# Patient Record
Sex: Male | Born: 1970
Health system: Southern US, Community
[De-identification: ages and names within clinical notes are randomized; demographics above are authoritative.]

## PROBLEM LIST (undated history)

## (undated) ENCOUNTER — Emergency Department (HOSPITAL_COMMUNITY): Admission: EM | Payer: Self-pay | Source: Home / Self Care

## (undated) DIAGNOSIS — K219 Gastro-esophageal reflux disease without esophagitis: Secondary | ICD-10-CM

## (undated) DIAGNOSIS — T8859XA Other complications of anesthesia, initial encounter: Secondary | ICD-10-CM

## (undated) DIAGNOSIS — T7840XA Allergy, unspecified, initial encounter: Secondary | ICD-10-CM

## (undated) DIAGNOSIS — E785 Hyperlipidemia, unspecified: Secondary | ICD-10-CM

## (undated) DIAGNOSIS — Z8619 Personal history of other infectious and parasitic diseases: Secondary | ICD-10-CM

## (undated) DIAGNOSIS — M1990 Unspecified osteoarthritis, unspecified site: Secondary | ICD-10-CM

## (undated) DIAGNOSIS — E119 Type 2 diabetes mellitus without complications: Secondary | ICD-10-CM

## (undated) DIAGNOSIS — T4145XA Adverse effect of unspecified anesthetic, initial encounter: Secondary | ICD-10-CM

## (undated) HISTORY — DX: Allergy, unspecified, initial encounter: T78.40XA

## (undated) HISTORY — PX: ORTHOPEDIC SURGERY: SHX850

## (undated) HISTORY — PX: OTHER SURGICAL HISTORY: SHX169

## (undated) HISTORY — DX: Type 2 diabetes mellitus without complications: E11.9

## (undated) HISTORY — PX: BACK SURGERY: SHX140

## (undated) HISTORY — DX: Hyperlipidemia, unspecified: E78.5

## (undated) HISTORY — PX: ROTATOR CUFF REPAIR: SHX139

---

## 2000-08-04 ENCOUNTER — Encounter: Admission: RE | Admit: 2000-08-04 | Discharge: 2000-11-02 | Payer: Self-pay | Admitting: Anesthesiology

## 2000-08-31 ENCOUNTER — Ambulatory Visit (HOSPITAL_COMMUNITY): Admission: RE | Admit: 2000-08-31 | Discharge: 2000-09-01 | Payer: Self-pay | Admitting: Neurosurgery

## 2000-08-31 ENCOUNTER — Encounter: Payer: Self-pay | Admitting: Neurosurgery

## 2005-02-28 ENCOUNTER — Ambulatory Visit: Payer: Self-pay | Admitting: Family Medicine

## 2005-07-10 ENCOUNTER — Ambulatory Visit: Payer: Self-pay | Admitting: Family Medicine

## 2006-04-22 ENCOUNTER — Encounter: Admission: RE | Admit: 2006-04-22 | Discharge: 2006-07-21 | Payer: Self-pay | Admitting: Orthopedic Surgery

## 2008-04-22 ENCOUNTER — Emergency Department (HOSPITAL_COMMUNITY): Admission: EM | Admit: 2008-04-22 | Discharge: 2008-04-22 | Payer: Self-pay | Admitting: Emergency Medicine

## 2009-05-13 ENCOUNTER — Emergency Department (HOSPITAL_COMMUNITY): Admission: EM | Admit: 2009-05-13 | Discharge: 2009-05-13 | Payer: Self-pay | Admitting: Emergency Medicine

## 2010-11-09 ENCOUNTER — Inpatient Hospital Stay (INDEPENDENT_AMBULATORY_CARE_PROVIDER_SITE_OTHER)
Admission: RE | Admit: 2010-11-09 | Discharge: 2010-11-09 | Disposition: A | Discharge status: Home / Self Care | Payer: Self-pay | Source: Ambulatory Visit | Attending: Family Medicine | Admitting: Family Medicine

## 2010-11-09 ENCOUNTER — Ambulatory Visit (INDEPENDENT_AMBULATORY_CARE_PROVIDER_SITE_OTHER): Payer: Self-pay

## 2010-11-09 DIAGNOSIS — IMO0002 Reserved for concepts with insufficient information to code with codable children: Secondary | ICD-10-CM

## 2010-11-11 ENCOUNTER — Other Ambulatory Visit: Payer: Self-pay | Admitting: Orthopedic Surgery

## 2010-11-11 DIAGNOSIS — M25562 Pain in left knee: Secondary | ICD-10-CM

## 2010-11-12 ENCOUNTER — Ambulatory Visit
Admission: RE | Admit: 2010-11-12 | Discharge: 2010-11-12 | Disposition: A | Discharge status: Home / Self Care | Payer: Self-pay | Source: Ambulatory Visit | Attending: Orthopedic Surgery | Admitting: Orthopedic Surgery

## 2010-11-12 DIAGNOSIS — M25562 Pain in left knee: Secondary | ICD-10-CM

## 2010-11-28 ENCOUNTER — Ambulatory Visit: Payer: Self-pay | Attending: Orthopedic Surgery | Admitting: Physical Therapy

## 2010-11-28 DIAGNOSIS — M25569 Pain in unspecified knee: Secondary | ICD-10-CM | POA: Insufficient documentation

## 2010-11-28 DIAGNOSIS — R269 Unspecified abnormalities of gait and mobility: Secondary | ICD-10-CM | POA: Insufficient documentation

## 2010-11-28 DIAGNOSIS — M25669 Stiffness of unspecified knee, not elsewhere classified: Secondary | ICD-10-CM | POA: Insufficient documentation

## 2010-11-28 DIAGNOSIS — R5381 Other malaise: Secondary | ICD-10-CM | POA: Insufficient documentation

## 2010-11-28 DIAGNOSIS — IMO0001 Reserved for inherently not codable concepts without codable children: Secondary | ICD-10-CM | POA: Insufficient documentation

## 2010-11-29 LAB — URINALYSIS, ROUTINE W REFLEX MICROSCOPIC
Bilirubin Urine: NEGATIVE
Glucose, UA: NEGATIVE mg/dL
Hgb urine dipstick: NEGATIVE
Ketones, ur: NEGATIVE mg/dL
Nitrite: NEGATIVE
Protein, ur: NEGATIVE mg/dL
Specific Gravity, Urine: <1.005 — ABNORMAL LOW (ref 1.005–1.030)
Urobilinogen, UA: 0.2 mg/dL (ref 0.0–1.0)
pH: 6 (ref 5.0–8.0)

## 2010-11-29 LAB — COMPREHENSIVE METABOLIC PANEL
ALT: 23 U/L (ref 0–53)
AST: 23 U/L (ref 0–37)
Albumin: 3.9 g/dL (ref 3.5–5.2)
Alkaline Phosphatase: 72 U/L (ref 39–117)
CO2: 26 mEq/L (ref 19–32)
Chloride: 105 mEq/L (ref 96–112)
Creatinine, Ser: 1 mg/dL (ref 0.4–1.5)
GFR calc Af Amer: >60 mL/min (ref >60)
GFR calc non Af Amer: >60 mL/min (ref >60)
Potassium: 4 mEq/L (ref 3.5–5.1)
Total Bilirubin: 0.4 mg/dL (ref 0.3–1.2)

## 2010-12-05 ENCOUNTER — Ambulatory Visit: Payer: Self-pay | Admitting: Physical Therapy

## 2010-12-09 ENCOUNTER — Encounter: Payer: Self-pay | Admitting: Physical Therapy

## 2010-12-12 ENCOUNTER — Ambulatory Visit: Payer: Self-pay | Admitting: Physical Therapy

## 2010-12-19 ENCOUNTER — Ambulatory Visit: Payer: Self-pay | Admitting: Physical Therapy

## 2010-12-26 ENCOUNTER — Ambulatory Visit: Payer: Self-pay | Attending: Orthopedic Surgery | Admitting: Physical Therapy

## 2010-12-26 DIAGNOSIS — IMO0001 Reserved for inherently not codable concepts without codable children: Secondary | ICD-10-CM | POA: Insufficient documentation

## 2010-12-26 DIAGNOSIS — R269 Unspecified abnormalities of gait and mobility: Secondary | ICD-10-CM | POA: Insufficient documentation

## 2010-12-26 DIAGNOSIS — R5381 Other malaise: Secondary | ICD-10-CM | POA: Insufficient documentation

## 2010-12-26 DIAGNOSIS — M25669 Stiffness of unspecified knee, not elsewhere classified: Secondary | ICD-10-CM | POA: Insufficient documentation

## 2010-12-26 DIAGNOSIS — M25569 Pain in unspecified knee: Secondary | ICD-10-CM | POA: Insufficient documentation

## 2011-01-10 NOTE — Procedures (Signed)
[facility]  Patient:    WHITLEY, STRYCHARZ                       MRN: [id number] Proc. Date: 08/13/00 Adm. Date:  [id number] Attending:  Thyra Breed CC:         Reinaldo Meeker, M.D.   Procedure Report  PROCEDURE:  Lumbar epidural steroid injection.  DIAGNOSIS:  Herniated nucleus pulposus at L4-5.  INTERVAL HISTORY:  The patients noted no improvement after his first injection but he wishes to proceed with a second today.  PHYSICAL EXAMINATION:  Blood pressure 118/82, heart rate 79, respiratory rate 20, O2 saturations 99%, pain level 7/10, temperature 98. He shows good healing from his previous injection site.  DESCRIPTION OF PROCEDURE:  After informed consent was obtained, the patient was placed in the sitting position and monitored. The patients back was prepped with Betadine x 3. A skin wheal was raised at the L4-5 interspace with 1 percent lidocaine. A 20 gauge Tuohy needle was introduced to the lumbar epidural space to loss of resistance to preservative free normal saline. There was no cerebrospinal fluid nor blood. 120 mg of Medrol and 8  ml of preservative free normal saline was gently injected.  The needle was flushed with preservative free normal saline and removed intact.  CONDITION POST PROCEDURE:  Stable.  DISCHARGE INSTRUCTIONS:  Resume previous diet. Limitations in activities per instruction sheet. Continue on current medications. The patient plans to follow-up with Dr. Gerlene Fee. DD:  08/13/00 TD:  08/14/00 Job: [id number]

## 2011-01-10 NOTE — Op Note (Signed)
[facility]  Patient:    BARNEY, RUSSOMANNO                       MRN: [id number] Proc. Date: 08/31/00 Adm. Date:  [id number] Attending:  Thyra Breed                           Operative Report  PREOPERATIVE DIAGNOSIS:  Herniated disk, L4-5, left.  POSTOPERATIVE DIAGNOSIS:  Herniated disk, L4-5, left.  PROCEDURE:  Left L4-5 intralaminar laminotomy for excision of herniated disk with the operating microscope.  SECONDARY PROCEDURE:  Microdissection, L4-5 disk and L5 nerve roots.  SURGEON:  Reinaldo Meeker, M.D.  ASSISTANT:  Julio Sicks, M.D.  PROCEDURE IN DETAIL:  After being placed in the prone position, the patients back was prepped and draped in the usual sterile fashion.  A localizing x-ray was taken prior to incision to identify the L4-5 level.  A midline incision was made above the spinous processes of L4 and L5.  Using the Bovie cutting current, the incision was carried down to the spinous processes. Subperiosteal dissection was then carried out on the left side of the spinous processes and laminae and the McCullough self-retaining retractor was placed for exposure.  A second x-ray was taken to confirm approach of the L4-5 level and this was correct.  Using the high-speed drill, the inferior one-third of the L4 lamina and the medial one-third of the facet joint were removed.  Drill was then used to remove the superior one-half of the L5 lamina.  Residual bone and ligamentum flavum were removed in a piecemeal fashion.  The microscope was draped and brought into the field and used the remainder of the case.  Using microdissection technique, the lateral aspect of the thecal sac and L5 nerve root were identified.  Further coagulation was carried out down to the floor of the canal to identify the L4-5 disk, which was found to be tremendously herniated.  After coagulating on the annulus, the annulus was incised with a 15 blade.  Using pituitary  rongeurs and curettes, a very thorough disk space clean-out was carried out, including removal of the known inferior fragment. At this point, inspection was carried out in all directions for any evidence of residual compression and none could be identified.  Large amounts of irrigation were carried out and any bleeding controlled with bipolar coagulation and Gelfoam.  The wound was then closed using interrupted Vicryl on the muscle, fascia and subcutaneous and subcuticular tissues and staples on the skin.  A sterile dressing was then applied.  Patient was extubated and taken to the recovery room in stable condition. DD:  08/31/00 TD:  08/31/00 Job: [id number]

## 2013-01-25 ENCOUNTER — Ambulatory Visit: Payer: Self-pay | Admitting: Physician Assistant

## 2013-02-23 ENCOUNTER — Observation Stay (HOSPITAL_COMMUNITY)
Admission: EM | Admit: 2013-02-23 | Discharge: 2013-02-24 | Disposition: A | Discharge status: Home / Self Care | Payer: BC Managed Care – PPO | Attending: General Surgery | Admitting: General Surgery

## 2013-02-23 ENCOUNTER — Encounter (HOSPITAL_COMMUNITY): Payer: Self-pay | Admitting: *Deleted

## 2013-02-23 ENCOUNTER — Emergency Department (HOSPITAL_COMMUNITY): Payer: BC Managed Care – PPO

## 2013-02-23 DIAGNOSIS — K8000 Calculus of gallbladder with acute cholecystitis without obstruction: Secondary | ICD-10-CM

## 2013-02-23 DIAGNOSIS — R1011 Right upper quadrant pain: Secondary | ICD-10-CM | POA: Insufficient documentation

## 2013-02-23 DIAGNOSIS — K8010 Calculus of gallbladder with chronic cholecystitis without obstruction: Principal | ICD-10-CM | POA: Insufficient documentation

## 2013-02-23 HISTORY — DX: Gastro-esophageal reflux disease without esophagitis: K21.9

## 2013-02-23 LAB — COMPREHENSIVE METABOLIC PANEL
ALT: 26 U/L (ref 0–53)
Albumin: 3.8 g/dL (ref 3.5–5.2)
Calcium: 9.3 mg/dL (ref 8.4–10.5)
GFR calc Af Amer: 90 mL/min — ABNORMAL LOW (ref >90)
Glucose, Bld: 109 mg/dL — ABNORMAL HIGH (ref 70–99)
Potassium: 4 mEq/L (ref 3.5–5.1)
Sodium: 140 mEq/L (ref 135–145)
Total Protein: 7.4 g/dL (ref 6.0–8.3)

## 2013-02-23 LAB — CBC WITH DIFFERENTIAL/PLATELET
Basophils Absolute: 0 10*3/uL (ref 0.0–0.1)
Basophils Relative: 1 % (ref 0–1)
Eosinophils Absolute: 0.3 10*3/uL (ref 0.0–0.7)
Eosinophils Relative: 5 % (ref 0–5)
Lymphs Abs: 2.4 10*3/uL (ref 0.7–4.0)
MCH: 28.2 pg (ref 26.0–34.0)
MCHC: 34.5 g/dL (ref 30.0–36.0)
MCV: 81.6 fL (ref 78.0–100.0)
Neutrophils Relative %: 56 % (ref 43–77)
Platelets: 241 10*3/uL (ref 150–400)
RDW: 12.4 % (ref 11.5–15.5)

## 2013-02-23 LAB — HEPATIC FUNCTION PANEL
ALT: 27 U/L (ref 0–53)
AST: 25 U/L (ref 0–37)
Albumin: 3.7 g/dL (ref 3.5–5.2)
Bilirubin, Direct: <0.1 mg/dL (ref 0.0–0.3)

## 2013-02-23 LAB — LIPASE, BLOOD: Lipase: 117 U/L — ABNORMAL HIGH (ref 11–59)

## 2013-02-23 LAB — SURGICAL PCR SCREEN: Staphylococcus aureus: NEGATIVE

## 2013-02-23 MED ORDER — ONDANSETRON HCL 4 MG/2ML IJ SOLN
4.0000 mg | Freq: Three times a day (TID) | INTRAMUSCULAR | Status: AC | PRN
  Administered 2013-02-23: 4 mg via INTRAVENOUS
  Filled 2013-02-23: qty 2

## 2013-02-23 MED ORDER — ENOXAPARIN SODIUM 40 MG/0.4ML ~~LOC~~ SOLN
40.0000 mg | SUBCUTANEOUS | Status: DC
  Administered 2013-02-23 – 2013-02-24 (×2): 40 mg via SUBCUTANEOUS
  Filled 2013-02-23 (×2): qty 0.4

## 2013-02-23 MED ORDER — ONDANSETRON HCL 4 MG/2ML IJ SOLN: 4.0000 mg | Freq: Four times a day (QID) | INTRAMUSCULAR | Status: DC | PRN

## 2013-02-23 MED ORDER — SODIUM CHLORIDE 0.9 % IV SOLN
INTRAVENOUS | Status: DC
  Administered 2013-02-23: 08:00:00 via INTRAVENOUS

## 2013-02-23 MED ORDER — HYDROMORPHONE HCL PF 1 MG/ML IJ SOLN
1.0000 mg | INTRAMUSCULAR | Status: DC | PRN
Start: 2013-02-23 — End: 2013-02-24
  Administered 2013-02-23: 1 mg via INTRAVENOUS

## 2013-02-23 MED ORDER — HYDROMORPHONE HCL PF 1 MG/ML IJ SOLN
1.0000 mg | Freq: Once | INTRAMUSCULAR | Status: AC
  Administered 2013-02-23: 1 mg via INTRAVENOUS
  Filled 2013-02-23: qty 1

## 2013-02-23 MED ORDER — SODIUM CHLORIDE 0.9 % IV SOLN
INTRAVENOUS | Status: DC
  Administered 2013-02-23 (×2): via INTRAVENOUS

## 2013-02-23 MED ORDER — SODIUM CHLORIDE 0.9 % IV SOLN
3.0000 g | Freq: Once | INTRAVENOUS | Status: AC
  Administered 2013-02-23: 3 g via INTRAVENOUS
  Filled 2013-02-23: qty 3

## 2013-02-23 MED ORDER — PANTOPRAZOLE SODIUM 40 MG IV SOLR
40.0000 mg | Freq: Every day | INTRAVENOUS | Status: DC
  Administered 2013-02-23: 40 mg via INTRAVENOUS
  Filled 2013-02-23: qty 40

## 2013-02-23 MED ORDER — ONDANSETRON HCL 4 MG/2ML IJ SOLN
4.0000 mg | Freq: Once | INTRAMUSCULAR | Status: AC
  Administered 2013-02-23: 4 mg via INTRAVENOUS
  Filled 2013-02-23: qty 2

## 2013-02-23 MED ORDER — ACETAMINOPHEN 325 MG PO TABS: 650.0000 mg | ORAL_TABLET | Freq: Four times a day (QID) | ORAL | Status: DC | PRN

## 2013-02-23 MED ORDER — PANTOPRAZOLE SODIUM 40 MG PO TBEC: 40.0000 mg | DELAYED_RELEASE_TABLET | Freq: Every day | ORAL | Status: DC

## 2013-02-23 MED ORDER — ACETAMINOPHEN 650 MG RE SUPP: 650.0000 mg | Freq: Four times a day (QID) | RECTAL | Status: DC | PRN

## 2013-02-23 MED ORDER — MORPHINE SULFATE 4 MG/ML IJ SOLN
4.0000 mg | Freq: Once | INTRAMUSCULAR | Status: AC
  Administered 2013-02-23: 4 mg via INTRAVENOUS
  Filled 2013-02-23: qty 1

## 2013-02-23 MED ORDER — CHLORHEXIDINE GLUCONATE CLOTH 2 % EX PADS
MEDICATED_PAD | Freq: Once | CUTANEOUS | Status: AC
  Administered 2013-02-23: 6 via TOPICAL

## 2013-02-23 MED ORDER — KCL IN DEXTROSE-NACL 20-5-0.45 MEQ/L-%-% IV SOLN
INTRAVENOUS | Status: DC
  Administered 2013-02-24: 09:00:00 via INTRAVENOUS
  Filled 2013-02-23 (×3): qty 1000

## 2013-02-23 MED ORDER — DIPHENHYDRAMINE HCL 50 MG/ML IJ SOLN: 12.5000 mg | Freq: Four times a day (QID) | INTRAMUSCULAR | Status: DC | PRN

## 2013-02-23 MED ORDER — DIPHENHYDRAMINE HCL 12.5 MG/5ML PO ELIX: 12.5000 mg | ORAL_SOLUTION | Freq: Four times a day (QID) | ORAL | Status: DC | PRN

## 2013-02-23 MED ORDER — HYDROMORPHONE HCL PF 1 MG/ML IJ SOLN
1.0000 mg | INTRAMUSCULAR | Status: AC | PRN
  Filled 2013-02-23: qty 1

## 2013-02-23 MED ORDER — CLINDAMYCIN PHOSPHATE 900 MG/50ML IV SOLN
900.0000 mg | Freq: Three times a day (TID) | INTRAVENOUS | Status: DC
  Administered 2013-02-23 – 2013-02-24 (×3): 900 mg via INTRAVENOUS
  Filled 2013-02-23 (×7): qty 50

## 2013-02-23 NOTE — ED Notes (Addendum)
The patient does relay a history of similar pain in the past, states that he has a history of gallstones in 2010.  States that the pain feels similar, however he cannot remember exactly.  States that the pain in his epigastric area woke him up from sleep this morning.  States that his pain is located in the epigastric area and radiates around to his mid back.

## 2013-02-23 NOTE — ED Provider Notes (Signed)
History  This chart was scribed for Ward Givens, MD by Bennett Scrape, ED Scribe. This patient was seen in room APA19/APA19 and the patient's care was started at 7:27 AM.  CSN: [id number]  Arrival date & time 02/23/13  0716   First MD Initiated Contact with Patient 02/23/13 [phone]     Chief Complaint  Patient presents with  . Abdominal Pain    Patient is a 42 y.o. male presenting with abdominal pain. The history is provided by the patient. No language interpreter was used.  Abdominal Pain This is a chronic problem. The current episode started 3 to 5 hours ago. The problem occurs constantly. The problem has not changed since onset.Associated symptoms include abdominal pain. Nothing aggravates the symptoms. The symptoms are relieved by eating. He has tried nothing for the symptoms.    HPI Comments: Brendan Macdonald is a 42 y.o. male who presents to the Emergency Department complaining of an episode of upper epigastric region described as constant, sharp  and burning that radiates into the mid chest and around to the back bilaterally with associated nausea and "dry heaves" that woke him out of sleep 4 hours ago (3:30 AM). He reports that he has had similar "nagging" pain intermittently for "years" and was evaluated by a GI specialist in 2010 but denies having an endoscopic procedure done at the time. US done in 2010 showed gallstones. He denies any suspect food intakes stating that he ate steak, baked potato, squash and tossed salad last night which is a normal dinner for him. He denies that the pain is related to eating certain foods and states that occasionally the pain is improved approximately 30 minutes after eating.He reports that the pain is worse when he is hungry.  He denies fevers and diarrhea as associated symptoms. Pt is an occasional alcohol user but denies smoking.  PCP Dr Christell Constant  Past Medical History  Diagnosis Date  . GERD (gastroesophageal reflux disease)     Past Surgical  History  Procedure Laterality Date  . Orthopedic surgery    . Back surgery    . Arthroscopy  Bilateral     Knees  . Rotator cuff repair Right    History reviewed. No pertinent family history. History  Substance Use Topics  . Smoking status: Former Smoker    Quit date: 03/25/2010  . Smokeless tobacco: Not on file  . Alcohol Use: Yes     Comment: Occasionally  Pt works as a Naval architect  Review of Systems  Constitutional: Negative for fever and chills.  Gastrointestinal: Positive for nausea, vomiting ("dry heaves") and abdominal pain. Negative for diarrhea.  All other systems reviewed and are negative.    Allergies  Oxycodone and Vicodin  Home Medications  No current outpatient prescriptions on file.-takes xantec x2 daily for GERD per pt  Triage Vitals: BP 129/93  Pulse 78  Temp(Src) 98 F (36.7 C) (Oral)  Resp 16  Ht 6\' 4"  (1.93 m)  Wt 270 lb (122.471 kg)  BMI 32.88 kg/m2  SpO2 99%  Vital signs normal    Physical Exam  Nursing note and vitals reviewed. Constitutional: He is oriented to person, place, and time. He appears well-developed and well-nourished.  Non-toxic appearance. He does not appear ill. No distress.  HENT:  Head: Normocephalic and atraumatic.  Right Ear: External ear normal.  Left Ear: External ear normal.  Nose: Nose normal. No mucosal edema or rhinorrhea.  Mouth/Throat: Oropharynx is clear and moist and mucous membranes are  normal. No dental abscesses or edematous.  Eyes: Conjunctivae and EOM are normal. Pupils are equal, round, and reactive to light.  Neck: Normal range of motion and full passive range of motion without pain. Neck supple.  Cardiovascular: Normal rate, regular rhythm and normal heart sounds.  Exam reveals no gallop and no friction rub.   No murmur heard. Pulmonary/Chest: Effort normal and breath sounds normal. No respiratory distress. He has no wheezes. He has no rhonchi. He has no rales. He exhibits no tenderness and no  crepitus.  Abdominal: Soft. Normal appearance and bowel sounds are normal. He exhibits no distension. There is no tenderness (non-tender epigastric region). There is no rebound and no guarding.    Indicates the pain as shown but is nontender.  Musculoskeletal: Normal range of motion. He exhibits no edema and no tenderness.  Moves all extremities well.   Neurological: He is alert and oriented to person, place, and time. He has normal strength. No cranial nerve deficit.  Skin: Skin is warm, dry and intact. No rash noted. No erythema. No pallor.  Psychiatric: He has a normal mood and affect. His speech is normal and behavior is normal. His mood appears not anxious.    ED Course  Procedures (including critical care time)  Medications  morphine 4 MG/ML injection 4 mg (4 mg Intravenous Given 02/23/13 0804)  ondansetron (ZOFRAN) injection 4 mg (4 mg Intravenous Given 02/23/13 0804)  morphine 4 MG/ML injection 4 mg (4 mg Intravenous Given 02/23/13 0847)  HYDROmorphone (DILAUDID) injection 1 mg (1 mg Intravenous Given 02/23/13 0917)  Ampicillin-Sulbactam (UNASYN) 3 g in sodium chloride 0.9 % 100 mL IVPB (0 g Intravenous Stopped 02/23/13 1017)  HYDROmorphone (DILAUDID) injection 1 mg (1 mg Intravenous Given 02/23/13 1009)   DIAGNOSTIC STUDIES: Oxygen Saturation is 99% on room air, normal by my interpretation.    COORDINATION OF CARE: 7:45 AM-Informed pt that EKG was normal. Discussed treatment plan which includes medications, [place], CBC panel, CMP and UA with pt at bedside and pt agreed to plan.   8:58 AM-Pt rechecked and states that the pain has not improved with medications listed above. Informed pt of labs and radiology. Discussed further treatment plan of antibiotics and pain medication with pt. patient has been requiring a lot of IV pain medication to control his pain and it was felt he would not do well as an outpatient with oral pain medications.   9:45 AM- Pt rechecked and reports that the pain is at  the same level as last recheck. Pt states that he spoke with his father and is comfortable with consult to surgery.  10:05 AM-Consult complete with Dr. Lovell Sheehan, surgery, through the OR nurse. Patient case explained and discussed. Dr. Lovell Sheehan agrees to admit patient for further evaluation and treatment. Call ended at 10:06 AM.  Results for orders placed during the hospital encounter of 02/23/13  SURGICAL PCR SCREEN      Result Value Range   MRSA, PCR NEGATIVE  NEGATIVE   Staphylococcus aureus NEGATIVE  NEGATIVE  CBC WITH DIFFERENTIAL      Result Value Range   WBC 7.2  4.0 - 10.5 K/uL   RBC 4.90  4.22 - 5.81 MIL/uL   Hemoglobin 13.8  13.0 - 17.0 g/dL   HCT 16.1  09.6 - 04.5 %   MCV 81.6  78.0 - 100.0 fL   MCH 28.2  26.0 - 34.0 pg   MCHC 34.5  30.0 - 36.0 g/dL   RDW 40.9  81.1 -  15.5 %   Platelets 241  150 - 400 K/uL   Neutrophils Relative % 56  43 - 77 %   Neutro Abs 4.1  1.7 - 7.7 K/uL   Lymphocytes Relative 33  12 - 46 %   Lymphs Abs 2.4  0.7 - 4.0 K/uL   Monocytes Relative 6  3 - 12 %   Monocytes Absolute 0.5  0.1 - 1.0 K/uL   Eosinophils Relative 5  0 - 5 %   Eosinophils Absolute 0.3  0.0 - 0.7 K/uL   Basophils Relative 1  0 - 1 %   Basophils Absolute 0.0  0.0 - 0.1 K/uL  COMPREHENSIVE METABOLIC PANEL      Result Value Range   Sodium 140  135 - 145 mEq/L   Potassium 4.0  3.5 - 5.1 mEq/L   Chloride 102  96 - 112 mEq/L   CO2 31  19 - 32 mEq/L   Glucose, Bld 109 (*) 70 - 99 mg/dL   BUN 13  6 - 23 mg/dL   Creatinine, Ser 1.61  0.50 - 1.35 mg/dL   Calcium 9.3  8.4 - 09.6 mg/dL   Total Protein 7.4  6.0 - 8.3 g/dL   Albumin 3.8  3.5 - 5.2 g/dL   AST 24  0 - 37 U/L   ALT 26  0 - 53 U/L   Alkaline Phosphatase 80  39 - 117 U/L   Total Bilirubin 0.2 (*) 0.3 - 1.2 mg/dL   GFR calc non Af Amer 77 (*) >90 mL/min   GFR calc Af Amer 90 (*) >90 mL/min  LIPASE, BLOOD      Result Value Range   Lipase 117 (*) 11 - 59 U/L  TROPONIN I      Result Value Range   Troponin I <0.30  <0.30  ng/mL  HEPATIC FUNCTION PANEL      Result Value Range   Total Protein 7.4  6.0 - 8.3 g/dL   Albumin 3.7  3.5 - 5.2 g/dL   AST 25  0 - 37 U/L   ALT 27  0 - 53 U/L   Alkaline Phosphatase 80  39 - 117 U/L   Total Bilirubin 0.2 (*) 0.3 - 1.2 mg/dL   Bilirubin, Direct <0.4  0.0 - 0.3 mg/dL   Indirect Bilirubin NOT CALCULATED  0.3 - 0.9 mg/dL   Laboratory interpretation all normal except elevated lipase   US Abdomen Limited Ruq  02/23/2013   *RADIOLOGY REPORT*  Clinical Data:  Gallstones  GALLBLADDER ULTRASOUND  Comparison:  None  Findings:  Gallbladder:  Gallbladder is contracted containing multiple stones. The gallbladder wall is thickened measuring 5.5 mm.  Mild pericholecystic fluid noted. Negative sonographic Murphy's sign.  Common Bile Duct:  Within normal limits in caliber.  Liver:  No focal liver lesion.  The parenchyma appears echogenic.  IMPRESSION:  1.  Gallstones and gallbladder wall thickening.  Cannot rule out cholecystitis. 2.  Hepatic steatosis.   Original Report Authenticated By: Signa Kell, M.D.     Date: 02/23/2013  Rate: 80  Rhythm: normal sinus rhythm  QRS Axis: normal  Intervals: normal  ST/T Wave abnormalities: normal  Conduction Disutrbances:none  Narrative Interpretation:   Old EKG Reviewed: none available    1. Gallstones and inflammation of gallbladder without obstruction     Plan admitted   Devoria Albe, MD, FACEP   MDM    I personally performed the services described in this documentation, which was scribed in my presence.  The recorded information has been reviewed and considered.  Devoria Albe, MD, Armando Gang        Ward Givens, MD 02/23/13 [phone]

## 2013-02-23 NOTE — H&P (Signed)
Brendan Macdonald is an 42 y.o. male.   Chief Complaint: Upper abdominal pain HPI: Patient is a 42 year old black male who presents with worsening epigastric pain. He states he has had multiple episodes of this pain in the past. Ultrasound of the gallbladder in 2010 was positive for cholelithiasis. This was repeated and showed cholelithiasis with a thickened gallbladder wall. Labs in the emergency room showed an elevated lipase, his liver enzyme tests are within normal limits. Due to his ongoing pain, he is being admitted to the hospital for further evaluation and treatment.  History reviewed. No pertinent past medical history.  Past Surgical History  Procedure Laterality Date  . Orthopedic surgery      No family history on file. Social History:  reports that he has never smoked. He does not have any smokeless tobacco history on file. He reports that  drinks alcohol. His drug history is not on file.  Allergies:  Allergies  Allergen Reactions  . Oxycodone Shortness Of Breath and Other (See Comments)    Hallucinations  . Vicodin (Hydrocodone-Acetaminophen) Shortness Of Breath and Other (See Comments)    Hallucinations     (Not in a hospital admission)  Results for orders placed during the hospital encounter of 02/23/13 (from the past 48 hour(s))  CBC WITH DIFFERENTIAL     Status: None   Collection Time    02/23/13  7:45 AM      Result Value Range   WBC 7.2  4.0 - 10.5 K/uL   RBC 4.90  4.22 - 5.81 MIL/uL   Hemoglobin 13.8  13.0 - 17.0 g/dL   HCT 16.1  09.6 - 04.5 %   MCV 81.6  78.0 - 100.0 fL   MCH 28.2  26.0 - 34.0 pg   MCHC 34.5  30.0 - 36.0 g/dL   RDW 40.9  81.1 - 91.4 %   Platelets 241  150 - 400 K/uL   Neutrophils Relative % 56  43 - 77 %   Neutro Abs 4.1  1.7 - 7.7 K/uL   Lymphocytes Relative 33  12 - 46 %   Lymphs Abs 2.4  0.7 - 4.0 K/uL   Monocytes Relative 6  3 - 12 %   Monocytes Absolute 0.5  0.1 - 1.0 K/uL   Eosinophils Relative 5  0 - 5 %   Eosinophils Absolute  0.3  0.0 - 0.7 K/uL   Basophils Relative 1  0 - 1 %   Basophils Absolute 0.0  0.0 - 0.1 K/uL  COMPREHENSIVE METABOLIC PANEL     Status: Abnormal   Collection Time    02/23/13  7:45 AM      Result Value Range   Sodium 140  135 - 145 mEq/L   Potassium 4.0  3.5 - 5.1 mEq/L   Chloride 102  96 - 112 mEq/L   CO2 31  19 - 32 mEq/L   Glucose, Bld 109 (*) 70 - 99 mg/dL   BUN 13  6 - 23 mg/dL   Creatinine, Ser 7.82  0.50 - 1.35 mg/dL   Calcium 9.3  8.4 - 95.6 mg/dL   Total Protein 7.4  6.0 - 8.3 g/dL   Albumin 3.8  3.5 - 5.2 g/dL   AST 24  0 - 37 U/L   ALT 26  0 - 53 U/L   Alkaline Phosphatase 80  39 - 117 U/L   Total Bilirubin 0.2 (*) 0.3 - 1.2 mg/dL   GFR calc non Af Amer 77 (*) >  90 mL/min   GFR calc Af Amer 90 (*) >90 mL/min   Comment:            The eGFR has been calculated     using the CKD EPI equation.     This calculation has not been     validated in all clinical     situations.     eGFR's persistently     <90 mL/min signify     possible Chronic Kidney Disease.  LIPASE, BLOOD     Status: Abnormal   Collection Time    02/23/13  7:45 AM      Result Value Range   Lipase 117 (*) 11 - 59 U/L  TROPONIN I     Status: None   Collection Time    02/23/13  7:45 AM      Result Value Range   Troponin I <0.30  <0.30 ng/mL   Comment:            Due to the release kinetics of cTnI,     a negative result within the first hours     of the onset of symptoms does not rule out     myocardial infarction with certainty.     If myocardial infarction is still suspected,     repeat the test at appropriate intervals.   US Abdomen Limited Ruq  02/23/2013   *RADIOLOGY REPORT*  Clinical Data:  Gallstones  GALLBLADDER ULTRASOUND  Comparison:  None  Findings:  Gallbladder:  Gallbladder is contracted containing multiple stones. The gallbladder wall is thickened measuring 5.5 mm.  Mild pericholecystic fluid noted. Negative sonographic Murphy's sign.  Common Bile Duct:  Within normal limits in caliber.   Liver:  No focal liver lesion.  The parenchyma appears echogenic.  IMPRESSION:  1.  Gallstones and gallbladder wall thickening.  Cannot rule out cholecystitis. 2.  Hepatic steatosis.   Original Report Authenticated By: Signa Kell, M.D.    Review of Systems  Constitutional: Positive for malaise/fatigue.  HENT: Negative.   Eyes: Negative.   Respiratory: Negative.   Cardiovascular: Negative.   Gastrointestinal: Positive for heartburn, nausea and abdominal pain.  Genitourinary: Negative.   Musculoskeletal: Negative.   Skin: Negative.   Neurological: Negative.   Endo/Heme/Allergies: Negative.     Blood pressure 128/90, pulse 81, temperature 98 F (36.7 C), temperature source Oral, resp. rate 13, height 6\' 4"  (1.93 m), weight 122.471 kg (270 lb), SpO2 100.00%. Physical Exam  Constitutional: He is oriented to person, place, and time. He appears well-developed and well-nourished. No distress.  HENT:  Head: Normocephalic and atraumatic.  Eyes: No scleral icterus.  Neck: Normal range of motion. Neck supple.  Cardiovascular: Normal rate, regular rhythm and normal heart sounds.   Respiratory: Effort normal and breath sounds normal.  GI: Soft. Bowel sounds are normal. There is no rebound and no guarding.  Minimal tenderness in the right upper quadrant to deep palpation. No rigidity noted.  Neurological: He is alert and oriented to person, place, and time.  Skin: Skin is warm and dry.     Assessment/Plan Impression: Cholecystitis, cholelithiasis, pancreatitis Plan: The patient admitted to the hospital for IV hydration, pain control, and nausea control. Due to the thickened gallbladder wall, he will be started on clindamycin. He subsequently will undergo a laparoscopic cholecystectomy with cholangiograms. Risks and benefits of the procedure including bleeding, infection, hepatobiliary injury, and the possibility of an open procedure were fully explained to the patient, who gave informed  consent.  Brendan Macdonald  A 02/23/2013, 11:01 AM

## 2013-02-23 NOTE — ED Notes (Signed)
Pt states abdominal pain is returning, 8/10. EDP aware and will reeval pt. Pt aware

## 2013-02-23 NOTE — ED Notes (Signed)
Pt states intermittent epigastric pain which radiates to the back x 2-3 months. Pain became worse at 0330 this morning. Pt has taken zantac 150mg  and Alka seltzer plus with no relief.

## 2013-02-23 NOTE — ED Notes (Signed)
The patient states that his pain level continues at 8/10.  States his pain will improve briefly, however return to 10/10 within several minutes, MD aware.

## 2013-02-24 ENCOUNTER — Encounter (HOSPITAL_COMMUNITY): Payer: Self-pay | Admitting: Anesthesiology

## 2013-02-24 ENCOUNTER — Encounter (HOSPITAL_COMMUNITY)
Admission: EM | Disposition: A | Discharge status: Home / Self Care | Payer: Self-pay | Source: Home / Self Care | Attending: General Surgery

## 2013-02-24 ENCOUNTER — Observation Stay (HOSPITAL_COMMUNITY): Payer: BC Managed Care – PPO | Admitting: Anesthesiology

## 2013-02-24 HISTORY — PX: CHOLECYSTECTOMY: SHX55

## 2013-02-24 LAB — BASIC METABOLIC PANEL
CO2: 30 mEq/L (ref 19–32)
Calcium: 9.2 mg/dL (ref 8.4–10.5)
Chloride: 103 mEq/L (ref 96–112)
Glucose, Bld: 121 mg/dL — ABNORMAL HIGH (ref 70–99)
Sodium: 139 mEq/L (ref 135–145)

## 2013-02-24 LAB — CBC
HCT: 38 % — ABNORMAL LOW (ref 39.0–52.0)
Hemoglobin: 13.3 g/dL (ref 13.0–17.0)
MCH: 28.7 pg (ref 26.0–34.0)
MCV: 82.1 fL (ref 78.0–100.0)
Platelets: 233 10*3/uL (ref 150–400)
RBC: 4.63 MIL/uL (ref 4.22–5.81)
WBC: 6.7 10*3/uL (ref 4.0–10.5)

## 2013-02-24 SURGERY — LAPAROSCOPIC CHOLECYSTECTOMY
Anesthesia: General | Site: Abdomen | Wound class: Contaminated

## 2013-02-24 MED ORDER — MIDAZOLAM HCL 2 MG/2ML IJ SOLN
INTRAMUSCULAR | Status: AC
  Filled 2013-02-24: qty 2

## 2013-02-24 MED ORDER — LACTATED RINGERS IV SOLN
INTRAVENOUS | Status: DC
  Administered 2013-02-24 (×2): via INTRAVENOUS

## 2013-02-24 MED ORDER — LIDOCAINE HCL (CARDIAC) 20 MG/ML IV SOLN
INTRAVENOUS | Status: DC | PRN
  Administered 2013-02-24: 50 mg via INTRAVENOUS

## 2013-02-24 MED ORDER — ONDANSETRON HCL 4 MG/2ML IJ SOLN
4.0000 mg | Freq: Once | INTRAMUSCULAR | Status: AC
  Administered 2013-02-24: 4 mg via INTRAVENOUS

## 2013-02-24 MED ORDER — HEMOSTATIC AGENTS (NO CHARGE) OPTIME
TOPICAL | Status: DC | PRN
  Administered 2013-02-24: 1 via TOPICAL

## 2013-02-24 MED ORDER — MIDAZOLAM HCL 2 MG/2ML IJ SOLN
1.0000 mg | INTRAMUSCULAR | Status: DC | PRN
  Administered 2013-02-24: 2 mg via INTRAVENOUS

## 2013-02-24 MED ORDER — PROPOFOL 10 MG/ML IV BOLUS
INTRAVENOUS | Status: DC | PRN
  Administered 2013-02-24: 200 mg via INTRAVENOUS

## 2013-02-24 MED ORDER — SUCCINYLCHOLINE CHLORIDE 20 MG/ML IJ SOLN
INTRAMUSCULAR | Status: DC | PRN
  Administered 2013-02-24: 160 mg via INTRAVENOUS

## 2013-02-24 MED ORDER — DEXAMETHASONE SODIUM PHOSPHATE 4 MG/ML IJ SOLN
4.0000 mg | Freq: Once | INTRAMUSCULAR | Status: AC
  Administered 2013-02-24: 4 mg via INTRAVENOUS

## 2013-02-24 MED ORDER — FENTANYL CITRATE 0.05 MG/ML IJ SOLN
INTRAMUSCULAR | Status: DC | PRN
  Administered 2013-02-24 (×2): 100 ug via INTRAVENOUS
  Administered 2013-02-24: 50 ug via INTRAVENOUS

## 2013-02-24 MED ORDER — KETOROLAC TROMETHAMINE 30 MG/ML IJ SOLN
30.0000 mg | Freq: Once | INTRAMUSCULAR | Status: AC
  Administered 2013-02-24: 30 mg via INTRAVENOUS

## 2013-02-24 MED ORDER — NEOSTIGMINE METHYLSULFATE 1 MG/ML IJ SOLN
INTRAMUSCULAR | Status: DC | PRN
  Administered 2013-02-24: 4 mg via INTRAVENOUS

## 2013-02-24 MED ORDER — FENTANYL CITRATE 0.05 MG/ML IJ SOLN: 25.0000 ug | INTRAMUSCULAR | Status: DC | PRN

## 2013-02-24 MED ORDER — KETOROLAC TROMETHAMINE 30 MG/ML IJ SOLN
INTRAMUSCULAR | Status: AC
  Filled 2013-02-24: qty 1

## 2013-02-24 MED ORDER — HYDROMORPHONE HCL 2 MG PO TABS: 2.0000 mg | ORAL_TABLET | ORAL | Status: DC | PRN

## 2013-02-24 MED ORDER — DEXAMETHASONE SODIUM PHOSPHATE 4 MG/ML IJ SOLN
INTRAMUSCULAR | Status: AC
  Filled 2013-02-24: qty 1

## 2013-02-24 MED ORDER — 0.9 % SODIUM CHLORIDE (POUR BTL) OPTIME
TOPICAL | Status: DC | PRN
  Administered 2013-02-24: 1000 mL

## 2013-02-24 MED ORDER — BUPIVACAINE HCL (PF) 0.5 % IJ SOLN
INTRAMUSCULAR | Status: AC
  Filled 2013-02-24: qty 30

## 2013-02-24 MED ORDER — ONDANSETRON HCL 4 MG/2ML IJ SOLN
INTRAMUSCULAR | Status: AC
  Filled 2013-02-24: qty 2

## 2013-02-24 MED ORDER — BUPIVACAINE HCL 0.5 % IJ SOLN
INTRAMUSCULAR | Status: DC | PRN
  Administered 2013-02-24: 10 mL

## 2013-02-24 MED ORDER — GLYCOPYRROLATE 0.2 MG/ML IJ SOLN
INTRAMUSCULAR | Status: DC | PRN
  Administered 2013-02-24: .5 mg via INTRAVENOUS

## 2013-02-24 MED ORDER — ONDANSETRON HCL 4 MG/2ML IJ SOLN: 4.0000 mg | Freq: Once | INTRAMUSCULAR | Status: DC | PRN

## 2013-02-24 MED ORDER — ROCURONIUM BROMIDE 100 MG/10ML IV SOLN
INTRAVENOUS | Status: DC | PRN
  Administered 2013-02-24: 10 mg via INTRAVENOUS
  Administered 2013-02-24: 20 mg via INTRAVENOUS

## 2013-02-24 SURGICAL SUPPLY — 43 items
APPLIER CLIP LAPSCP 10X32 DD (CLIP) ×3 IMPLANT
BAG HAMPER (MISCELLANEOUS) ×3 IMPLANT
CATH CHOLANGIOGRAM 4.5FR (CATHETERS) IMPLANT
CLOTH BEACON ORANGE TIMEOUT ST (SAFETY) ×3 IMPLANT
COVER LIGHT HANDLE STERIS (MISCELLANEOUS) ×6 IMPLANT
COVER MAYO STAND XLG (DRAPE) IMPLANT
DECANTER SPIKE VIAL GLASS SM (MISCELLANEOUS) ×3 IMPLANT
DISSECTOR BLUNT TIP ENDO 5MM (MISCELLANEOUS) IMPLANT
DRAPE C-ARM FOLDED MOBILE STRL (DRAPES) ×3 IMPLANT
DURAPREP 26ML APPLICATOR (WOUND CARE) ×3 IMPLANT
ELECT REM PT RETURN 9FT ADLT (ELECTROSURGICAL) ×3
ELECTRODE REM PT RTRN 9FT ADLT (ELECTROSURGICAL) ×2 IMPLANT
FILTER SMOKE EVAC LAPAROSHD (FILTER) ×3 IMPLANT
FORMALIN 10 PREFIL 120ML (MISCELLANEOUS) ×3 IMPLANT
GLOVE BIO SURGEON STRL SZ7.5 (GLOVE) ×3 IMPLANT
GLOVE BIOGEL PI IND STRL 7.0 (GLOVE) ×4 IMPLANT
GLOVE BIOGEL PI IND STRL 7.5 (GLOVE) ×2 IMPLANT
GLOVE BIOGEL PI INDICATOR 7.0 (GLOVE) ×2
GLOVE BIOGEL PI INDICATOR 7.5 (GLOVE) ×1
GLOVE ECLIPSE 6.5 STRL STRAW (GLOVE) ×3 IMPLANT
GLOVE SS BIOGEL STRL SZ 6.5 (GLOVE) ×4 IMPLANT
GLOVE SUPERSENSE BIOGEL SZ 6.5 (GLOVE) ×2
GOWN STRL REIN XL XLG (GOWN DISPOSABLE) ×9 IMPLANT
HEMOSTAT SNOW SURGICEL 2X4 (HEMOSTASIS) ×3 IMPLANT
INST SET LAPROSCOPIC AP (KITS) ×3 IMPLANT
KIT ROOM TURNOVER APOR (KITS) ×3 IMPLANT
KIT TROCAR LAP CHOLE (TROCAR) ×3 IMPLANT
MANIFOLD NEPTUNE II (INSTRUMENTS) ×3 IMPLANT
NS IRRIG 1000ML POUR BTL (IV SOLUTION) ×3 IMPLANT
PACK LAP CHOLE LZT030E (CUSTOM PROCEDURE TRAY) ×3 IMPLANT
PAD ARMBOARD 7.5X6 YLW CONV (MISCELLANEOUS) ×3 IMPLANT
POUCH SPECIMEN RETRIEVAL 10MM (ENDOMECHANICALS) ×3 IMPLANT
SET BASIN LINEN APH (SET/KITS/TRAYS/PACK) ×3 IMPLANT
SET TUBE IRRIG SUCTION NO TIP (IRRIGATION / IRRIGATOR) IMPLANT
SPONGE GAUZE 2X2 8PLY STRL LF (GAUZE/BANDAGES/DRESSINGS) ×12 IMPLANT
STAPLER VISISTAT (STAPLE) ×3 IMPLANT
SUT VICRYL 0 UR6 27IN ABS (SUTURE) ×3 IMPLANT
SYR 20CC LL (SYRINGE) IMPLANT
SYR 30ML LL (SYRINGE) IMPLANT
TAPE CLOTH SURG 4X10 WHT LF (GAUZE/BANDAGES/DRESSINGS) ×3 IMPLANT
TUBING INSUFFLATION (TUBING) ×3 IMPLANT
WARMER LAPAROSCOPE (MISCELLANEOUS) ×3 IMPLANT
YANKAUER SUCT 12FT TUBE ARGYLE (SUCTIONS) ×3 IMPLANT

## 2013-02-24 NOTE — Transfer of Care (Signed)
Immediate Anesthesia Transfer of Care Note  Patient: Brendan Macdonald  Procedure(s) Performed: Procedure(s): LAPAROSCOPIC CHOLECYSTECTOMY (N/A)  Patient Location: PACU  Anesthesia Type:General  Level of Consciousness: sedated  Airway & Oxygen Therapy: Patient Spontanous Breathing and Patient connected to face mask oxygen  Post-op Assessment: Report given to PACU RN and Post -op Vital signs reviewed and stable  Post vital signs: Reviewed and stable  Complications: No apparent anesthesia complications

## 2013-02-24 NOTE — Anesthesia Postprocedure Evaluation (Signed)
  Anesthesia Post-op Note  Patient: Brendan Macdonald  Procedure(s) Performed: Procedure(s): LAPAROSCOPIC CHOLECYSTECTOMY (N/A)  Patient Location: PACU  Anesthesia Type:General  Level of Consciousness: awake and alert   Airway and Oxygen Therapy: Patient Spontanous Breathing and Patient connected to face mask oxygen  Post-op Pain: none  Post-op Assessment: Post-op Vital signs reviewed, Patient's Cardiovascular Status Stable and Respiratory Function Stable  Post-op Vital Signs: Reviewed and stable  Complications: No apparent anesthesia complications

## 2013-02-24 NOTE — Progress Notes (Signed)
Pt a/o.vss. Up ad lib. Saline lock removed. Dressing to abdomen clean dry and intact. Discharge instructions given. Prescriptions given. Pt verbalized understanding of instructions. Pt will leave floor via wheelchair with family and nursing staff. Discharged to home.

## 2013-02-24 NOTE — Anesthesia Preprocedure Evaluation (Signed)
Anesthesia Evaluation  Patient identified by MRN, date of birth, ID band Patient awake    Reviewed: Allergy & Precautions, H&P , NPO status , Patient's Chart, lab work & pertinent test results  Airway Mallampati: I      Dental  (+) Teeth Intact   Pulmonary neg pulmonary ROS,  breath sounds clear to auscultation        Cardiovascular negative cardio ROS  Rhythm:Regular Rate:Normal     Neuro/Psych    GI/Hepatic GERD-  Medicated and Controlled,  Endo/Other    Renal/GU      Musculoskeletal   Abdominal   Peds  Hematology   Anesthesia Other Findings   Reproductive/Obstetrics                           Anesthesia Physical Anesthesia Plan  ASA: II  Anesthesia Plan: General   Post-op Pain Management:    Induction: Intravenous, Rapid sequence and Cricoid pressure planned  Airway Management Planned: Oral ETT  Additional Equipment:   Intra-op Plan:   Post-operative Plan: Extubation in OR  Informed Consent: I have reviewed the patients History and Physical, chart, labs and discussed the procedure including the risks, benefits and alternatives for the proposed anesthesia with the patient or authorized representative who has indicated his/her understanding and acceptance.     Plan Discussed with:   Anesthesia Plan Comments:         Anesthesia Quick Evaluation

## 2013-02-24 NOTE — Progress Notes (Signed)
UR Chart Review Completed  

## 2013-02-24 NOTE — Anesthesia Procedure Notes (Signed)
Procedure Name: Intubation Date/Time: 02/24/2013 11:26 AM Performed by: Caren Macadam Pre-anesthesia Checklist: Patient identified, Emergency Drugs available, Suction available and Patient being monitored Patient Re-evaluated:Patient Re-evaluated prior to inductionOxygen Delivery Method: Circle System Utilized Preoxygenation: Pre-oxygenation with 100% oxygen Intubation Type: IV induction Ventilation: Mask ventilation without difficulty Laryngoscope Size: Miller and 2 Grade View: Grade I Tube type: Oral Tube size: 8.0 mm Number of attempts: 2 Airway Equipment and Method: stylet and oral airway Placement Confirmation: ETT inserted through vocal cords under direct vision,  positive ETCO2 and breath sounds checked- equal and bilateral Secured at: 22 cm Tube secured with: Tape Dental Injury: Teeth and Oropharynx as per pre-operative assessment

## 2013-02-24 NOTE — Op Note (Signed)
Patient:  Brendan Macdonald  DOB:  [birth year]  MRN:  [id number]   Preop Diagnosis:  Cholecystitis, cholelithiasis  Postop Diagnosis:  Same  Procedure:  Laparoscopic cholecystectomy  Surgeon:  [name], M.D.  Anes:  General endotracheal  Indications:  Patient is a 42 year old black male who presents with cholecystitis secondary to cholelithiasis. The risks and benefits of the procedure including bleeding, infection, hepatobiliary injury, and the possibility of an open procedure were fully explained to the patient, who gave informed consent.  Procedure note:  The patient is placed the supine position. After induction of general endotracheal anesthesia, the abdomen was prepped and draped using the usual sterile technique with DuraPrep. Surgical site confirmation was performed.  A supraumbilical incision was made down to the fascia. A Veress needle was introduced into the abdominal cavity and confirmation of placement was done with saline drop test. The abdomen was then insufflated to 16 mm mercury pressure. An 11 mm trocar was introduced into the abdominal cavity under direct visualization without difficulty. The patient was placed in reverse Trendelenburg position and additional 11 mm trocar was placed in the epigastric region and 5 mm trochars were placed the right upper quadrant and right flank regions. The liver was inspected and noted within normal limits. The gallbladder was noted to be inflamed with a thickened gallbladder wall. The gallbladder was decompressed with suction in order to facilitate exposure. The gallbladder was retracted in a dynamic fashion in order to expose the triangle of Calot. The cystic duct was first identified. Its juncture to the infundibulum was fully identified. Endoclips were placed proximally and distally on the cystic duct, and the cystic duct was divided. This was likewise done to the cystic artery. The gallbladder was then freed away from the gallbladder  fossa using Bovie electrocautery. The gallbladder was removed using an Endo Catch bag without difficulty. The gallbladder fossa was inspected and any bleeding was controlled using Bovie electrocautery. Surgicel is placed the gallbladder fossa. All fluid and air were then evacuated from the abdominal cavity prior to removal of the trochars.  All wounds were irrigated normal saline. All wounds were injected with 0.5% Sensorcaine. The supraumbilical fascia as well as epigastric fascia reapproximated using 0 Vicryl interrupted sutures. All skin incisions were closed using staples. Betadine ointment and dry sterile dressings were applied.   All tape and needle counts were correct at the end of the procedure. Patient was extubated in the operating room and transferred to PACU in stable condition.  Complications:  None  EBL:  Minimal  Specimen:  Gallbladder

## 2013-02-25 NOTE — Discharge Summary (Signed)
Physician Discharge Summary  Patient ID: Brendan Macdonald MRN: [id number] DOB/AGE: 09-20-1970 42 y.o.  Admit date: 02/23/2013 Discharge date: 02/24/2013 Admission Diagnoses: Cholecystitis, cholelithiasis  Discharge Diagnoses: Same Active Problems:   * No active hospital problems. *   Discharged Condition: good  Hospital Course: Patient is a 42 year old black male who presented emergency room with worsening right upper quadrant abdominal pain. Ultrasound of gallbladder revealed a thickened gallbladder wall with cholelithiasis. He was admitted to the hospital for further evaluation treatment. He subsequently underwent laparoscopic cholecystectomy on 02/24/2013. He tolerated the procedure well. His postoperative course has been unremarkable. His diet was without difficulty. He was discharged home on 02/24/2013 in good and improving condition.  Treatments: surgery: Laparoscopic cholecystectomy on 02/24/2013  Discharge Exam: Blood pressure 127/82, pulse 71, temperature 98.5 F (36.9 C), temperature source Oral, resp. rate 18, height 6\' 5"  (1.956 m), weight 122.471 kg (270 lb), SpO2 96.00%. General appearance: alert and cooperative Resp: clear to auscultation bilaterally Cardio: regular rate and rhythm, S1, S2 normal, no murmur, click, rub or gallop GI: Soft. Dressings dry and intact.  Disposition: 01-Home or Self Care     Medication List         aspirin-sod bicarb-citric acid 325 MG Tbef  Commonly known as:  ALKA-SELTZER  Take 325 mg by mouth every 6 (six) hours as needed (Stomach Pain).     fexofenadine-pseudoephedrine 180-240 MG per 24 hr tablet  Commonly known as:  ALLEGRA-D 24  Take 1 tablet by mouth daily.     HYDROmorphone 2 MG tablet  Commonly known as:  DILAUDID  Take 1 tablet (2 mg total) by mouth every 3 (three) hours as needed for pain.     ranitidine 150 MG tablet  Commonly known as:  ZANTAC  Take 150 mg by mouth 2 (two) times daily.           Follow-up  Information   Follow up with Dalia Heading, MD. Schedule an appointment as soon as possible for a visit on 03/03/2013. (at 9:00 am)    Contact information:   [address] [place] [address] [phone]       Signed: [name] A 02/25/2013, 10:06 AM

## 2013-03-01 ENCOUNTER — Encounter (HOSPITAL_COMMUNITY): Payer: Self-pay | Admitting: General Surgery

## 2013-09-21 ENCOUNTER — Encounter (INDEPENDENT_AMBULATORY_CARE_PROVIDER_SITE_OTHER): Payer: Self-pay

## 2013-09-21 ENCOUNTER — Encounter: Payer: Self-pay | Admitting: Family Medicine

## 2013-09-21 ENCOUNTER — Ambulatory Visit (INDEPENDENT_AMBULATORY_CARE_PROVIDER_SITE_OTHER): Payer: BC Managed Care – PPO | Admitting: Family Medicine

## 2013-09-21 VITALS — BP 119/79 | HR 94 | Temp 99.2°F | Ht 77.0 in | Wt 291.0 lb

## 2013-09-21 DIAGNOSIS — J309 Allergic rhinitis, unspecified: Secondary | ICD-10-CM

## 2013-09-21 DIAGNOSIS — Z3009 Encounter for other general counseling and advice on contraception: Secondary | ICD-10-CM

## 2013-09-21 DIAGNOSIS — M25569 Pain in unspecified knee: Secondary | ICD-10-CM

## 2013-09-21 DIAGNOSIS — B353 Tinea pedis: Secondary | ICD-10-CM

## 2013-09-21 MED ORDER — TERBINAFINE HCL 250 MG PO TABS: 250.0000 mg | ORAL_TABLET | Freq: Every day | ORAL | Status: DC

## 2013-09-21 MED ORDER — MONTELUKAST SODIUM 10 MG PO TABS: 10.0000 mg | ORAL_TABLET | Freq: Every day | ORAL | Status: DC

## 2013-09-21 MED ORDER — METHYLPREDNISOLONE (PAK) 4 MG PO TABS: ORAL_TABLET | ORAL | Status: DC

## 2013-09-21 MED ORDER — KETOCONAZOLE 2 % EX CREA: 1.0000 "application " | TOPICAL_CREAM | Freq: Every day | CUTANEOUS | Status: DC

## 2013-09-21 MED ORDER — MELOXICAM 15 MG PO TABS: 15.0000 mg | ORAL_TABLET | Freq: Every day | ORAL | Status: DC

## 2013-09-21 MED ORDER — FLUTICASONE PROPIONATE 50 MCG/ACT NA SUSP: 2.0000 | Freq: Every day | NASAL | Status: DC

## 2013-09-21 NOTE — Progress Notes (Signed)
   Subjective:    Patient ID: Brendan HerterRobert W Macdonald, male    DOB: 05/14/1971, 43 y.o.   MRN: [id number]  HPI  This 43 y.o. male presents for evaluation of bilateral toenail fungus and scaling of feet. He also has problems with SAR, knee pain, and is requesting referral for vasectomy..  Review of Systems    No chest pain, SOB, HA, dizziness, vision change, N/V, diarrhea, constipation, dysuria, urinary urgency or frequency, myalgias, arthralgias or rash.  Objective:   Physical Exam Vital signs noted  Well developed well nourished male.  HEENT - Head atraumatic Normocephalic                Eyes - PERRLA, Conjuctiva - clear Sclera- Clear EOMI                Ears - EAC's Wnl TM's Wnl Gross Hearing WNL                Nose - Nares patent                 Throat - oropharanx wnl Respiratory - Lungs CTA bilateral Cardiac - RRR S1 and S2 without murmur GI - Abdomen soft Nontender and bowel sounds active x 4 Extremities - No edema. Neuro - Grossly intact. Feet - Bilateral tinea pedis and onychomycosis to first toenails bilateral. MS - TTP bilateral knees     Assessment & Plan:  Allergic rhinitis - Plan: fluticasone (FLONASE) 50 MCG/ACT nasal spray, montelukast (SINGULAIR) 10 MG tablet, methylPREDNIsolone (MEDROL DOSPACK) 4 MG tablet  Knee pain - Plan: meloxicam (MOBIC) 15 MG tablet  Sterilization consult - Plan: Ambulatory referral to Urology  Tinea pedis - Plan: terbinafine (LAMISIL) 250 MG tablet, ketoconazole (NIZORAL) 2 % cream po qd Follow up in 2 weeks for LFT  Deatra CanterWilliam J Oxford FNP

## 2013-09-21 NOTE — Patient Instructions (Signed)
Athlete's Foot Athlete's foot (tinea pedis) is a fungal infection of the skin on the feet. It often occurs on the skin between the toes or underneath the toes. It can also occur on the soles of the feet. Athlete's foot is more likely to occur in hot, humid weather. Not washing your feet or changing your socks often enough can contribute to athlete's foot. The infection can spread from person to person (contagious). CAUSES Athlete's foot is caused by a fungus. This fungus thrives in warm, moist places. Most people get athlete's foot by sharing shower stalls, towels, and wet floors with an infected person. People with weakened immune systems, including those with diabetes, may be more likely to get athlete's foot. SYMPTOMS   Itchy areas between the toes or on the soles of the feet.  White, flaky, or scaly areas between the toes or on the soles of the feet.  Tiny, intensely itchy blisters between the toes or on the soles of the feet.  Tiny cuts on the skin. These cuts can develop a bacterial infection.  Thick or discolored toenails. DIAGNOSIS  Your caregiver can usually tell what the problem is by doing a physical exam. Your caregiver may also take a skin sample from the rash area. The skin sample may be examined under a microscope, or it may be tested to see if fungus will grow in the sample. A sample may also be taken from your toenail for testing. TREATMENT  Over-the-counter and prescription medicines can be used to kill the fungus. These medicines are available as powders or creams. Your caregiver can suggest medicines for you. Fungal infections respond slowly to treatment. You may need to continue using your medicine for several weeks. PREVENTION   Do not share towels.  Wear sandals in wet areas, such as shared locker rooms and shared showers.  Keep your feet dry. Wear shoes that allow air to circulate. Wear cotton or wool socks. HOME CARE INSTRUCTIONS   Take medicines as directed by  your caregiver. Do not use steroid creams on athlete's foot.  Keep your feet clean and cool. Wash your feet daily and dry them thoroughly, especially between your toes.  Change your socks every day. Wear cotton or wool socks. In hot climates, you may need to change your socks 2 to 3 times per day.  Wear sandals or canvas tennis shoes with good air circulation.  If you have blisters, soak your feet in Burow's solution or Epsom salts for 20 to 30 minutes, 2 times a day to dry out the blisters. Make sure you dry your feet thoroughly afterward. SEEK MEDICAL CARE IF:   You have a fever.  You have swelling, soreness, warmth, or redness in your foot.  You are not getting better after 7 days of treatment.  You are not completely cured after 30 days.  You have any problems caused by your medicines. MAKE SURE YOU:   Understand these instructions.  Will watch your condition.  Will get help right away if you are not doing well or get worse. Document Released: 08/08/2000 Document Revised: 11/03/2011 Document Reviewed: 05/30/2011 [employer] Patient Information 2014 [employer].  

## 2013-10-03 ENCOUNTER — Ambulatory Visit: Payer: Self-pay | Admitting: Family Medicine

## 2013-10-04 ENCOUNTER — Telehealth: Payer: Self-pay | Admitting: Family Medicine

## 2013-10-04 DIAGNOSIS — B353 Tinea pedis: Secondary | ICD-10-CM

## 2013-10-04 MED ORDER — KETOCONAZOLE 2 % EX CREA: 1.0000 "application " | TOPICAL_CREAM | Freq: Every day | CUTANEOUS | Status: DC

## 2013-10-04 NOTE — Telephone Encounter (Signed)
Prescription sent to pharmacy for ketoconazole.  Patient is due for f/u on LFTs since starting Lamisil. He can discuss other issue at appt. Snoring was not mentioned in previous office note.  Appt scheduled for tomorrow. Patient agreeable.

## 2013-10-05 ENCOUNTER — Ambulatory Visit (INDEPENDENT_AMBULATORY_CARE_PROVIDER_SITE_OTHER): Payer: BC Managed Care – PPO | Admitting: Family Medicine

## 2013-10-05 ENCOUNTER — Encounter: Payer: Self-pay | Admitting: Family Medicine

## 2013-10-05 VITALS — BP 120/73 | HR 91 | Temp 97.0°F | Ht 77.0 in | Wt 291.0 lb

## 2013-10-05 DIAGNOSIS — B351 Tinea unguium: Secondary | ICD-10-CM

## 2013-10-05 DIAGNOSIS — J309 Allergic rhinitis, unspecified: Secondary | ICD-10-CM

## 2013-10-05 DIAGNOSIS — Z0000 Encounter for general adult medical examination without abnormal findings: Secondary | ICD-10-CM

## 2013-10-05 MED ORDER — FEXOFENADINE-PSEUDOEPHED ER 180-240 MG PO TB24: 1.0000 | ORAL_TABLET | Freq: Every day | ORAL | Status: DC

## 2013-10-05 MED ORDER — FLUTICASONE PROPIONATE 50 MCG/ACT NA SUSP: 2.0000 | Freq: Every day | NASAL | Status: DC

## 2013-10-05 NOTE — Progress Notes (Signed)
   Subjective:    Patient ID: Brendan Macdonald, male    DOB: 31-May-1971, 43 y.o.   MRN: [id number]  HPI This 43 y.o. male presents for evaluation of onychomycosis and needs LFT since He is on lamisil.  He is taking meloxicam for arthritis.   Review of Systems No chest pain, SOB, HA, dizziness, vision change, N/V, diarrhea, constipation, dysuria, urinary urgency or frequency, myalgias, arthralgias or rash.     Objective:   Physical Exam  Vital signs noted  Well developed well nourished male.  HEENT - Head atraumatic Normocephalic                Eyes - PERRLA, Conjuctiva - clear Sclera- Clear EOMI                Ears - EAC's Wnl TM's Wnl Gross Hearing WNL                Nose - Nares patent                 Throat - oropharanx wnl Respiratory - Lungs CTA bilateral Cardiac - RRR S1 and S2 without murmur GI - Abdomen soft Nontender and bowel sounds active x 4 Extremities - No edema. Neuro - Grossly intact.      Assessment & Plan:  Onychomycosis - Plan: Hepatic function panel and continue lamisil  Allergic rhinitis - Plan: fexofenadine-pseudoephedrine (ALLEGRA-D 24) 180-240 MG per 24 hr tablet, fluticasone (FLONASE) 50 MCG/ACT nasal spray  Routine general medical examination at a health care facility - Plan: PSA, total and free, BMP8+EGFR, TSH  Lysbeth Penner FNP

## 2013-10-06 LAB — HEPATIC FUNCTION PANEL
ALT: 35 IU/L (ref 0–44)
AST: 25 IU/L (ref 0–40)
Albumin: 4.2 g/dL (ref 3.5–5.5)
Alkaline Phosphatase: 73 IU/L (ref 39–117)
Bilirubin, Direct: 0.07 mg/dL (ref 0.00–0.40)
Total Bilirubin: 0.3 mg/dL (ref 0.0–1.2)
Total Protein: 6.9 g/dL (ref 6.0–8.5)

## 2013-10-06 LAB — PSA, TOTAL AND FREE
PSA, Free Pct: 38.3 %
PSA, Free: 0.23 ng/mL
PSA: 0.6 ng/mL (ref 0.0–4.0)

## 2013-10-06 LAB — BMP8+EGFR
BUN/Creatinine Ratio: 12 (ref 9–20)
BUN: 13 mg/dL (ref 6–24)
CO2: 27 mmol/L (ref 18–29)
Calcium: 9.6 mg/dL (ref 8.7–10.2)
Chloride: 100 mmol/L (ref 97–108)
Creatinine, Ser: 1.09 mg/dL (ref 0.76–1.27)
GFR calc Af Amer: 97 mL/min/{1.73_m2} (ref >59)
GFR calc non Af Amer: 84 mL/min/{1.73_m2} (ref >59)
Glucose: 107 mg/dL — ABNORMAL HIGH (ref 65–99)
Potassium: 4.2 mmol/L (ref 3.5–5.2)
Sodium: 140 mmol/L (ref 134–144)

## 2013-10-06 LAB — TSH: TSH: 0.973 u[IU]/mL (ref 0.450–4.500)

## 2013-10-22 ENCOUNTER — Emergency Department (HOSPITAL_COMMUNITY): Payer: BC Managed Care – PPO

## 2013-10-22 ENCOUNTER — Other Ambulatory Visit: Payer: Self-pay | Admitting: Family Medicine

## 2013-10-22 ENCOUNTER — Emergency Department (HOSPITAL_COMMUNITY)
Admission: EM | Admit: 2013-10-22 | Discharge: 2013-10-22 | Disposition: A | Discharge status: Home / Self Care | Payer: BC Managed Care – PPO | Attending: Emergency Medicine | Admitting: Emergency Medicine

## 2013-10-22 ENCOUNTER — Encounter (HOSPITAL_COMMUNITY): Payer: Self-pay | Admitting: Emergency Medicine

## 2013-10-22 ENCOUNTER — Ambulatory Visit (INDEPENDENT_AMBULATORY_CARE_PROVIDER_SITE_OTHER): Payer: BC Managed Care – PPO | Admitting: Family Medicine

## 2013-10-22 VITALS — BP 130/85 | HR 77 | Temp 97.0°F | Ht 75.5 in | Wt 290.0 lb

## 2013-10-22 DIAGNOSIS — R5381 Other malaise: Secondary | ICD-10-CM | POA: Insufficient documentation

## 2013-10-22 DIAGNOSIS — Z8619 Personal history of other infectious and parasitic diseases: Secondary | ICD-10-CM | POA: Insufficient documentation

## 2013-10-22 DIAGNOSIS — Z791 Long term (current) use of non-steroidal anti-inflammatories (NSAID): Secondary | ICD-10-CM | POA: Insufficient documentation

## 2013-10-22 DIAGNOSIS — Z87891 Personal history of nicotine dependence: Secondary | ICD-10-CM | POA: Insufficient documentation

## 2013-10-22 DIAGNOSIS — R5383 Other fatigue: Secondary | ICD-10-CM

## 2013-10-22 DIAGNOSIS — N529 Male erectile dysfunction, unspecified: Secondary | ICD-10-CM | POA: Insufficient documentation

## 2013-10-22 DIAGNOSIS — Z8719 Personal history of other diseases of the digestive system: Secondary | ICD-10-CM | POA: Insufficient documentation

## 2013-10-22 DIAGNOSIS — R209 Unspecified disturbances of skin sensation: Secondary | ICD-10-CM | POA: Insufficient documentation

## 2013-10-22 DIAGNOSIS — R079 Chest pain, unspecified: Secondary | ICD-10-CM

## 2013-10-22 DIAGNOSIS — IMO0002 Reserved for concepts with insufficient information to code with codable children: Secondary | ICD-10-CM | POA: Insufficient documentation

## 2013-10-22 DIAGNOSIS — R200 Anesthesia of skin: Secondary | ICD-10-CM

## 2013-10-22 DIAGNOSIS — R51 Headache: Secondary | ICD-10-CM | POA: Insufficient documentation

## 2013-10-22 DIAGNOSIS — R202 Paresthesia of skin: Secondary | ICD-10-CM

## 2013-10-22 DIAGNOSIS — Z79899 Other long term (current) drug therapy: Secondary | ICD-10-CM | POA: Insufficient documentation

## 2013-10-22 LAB — URINALYSIS, ROUTINE W REFLEX MICROSCOPIC
BILIRUBIN URINE: NEGATIVE
Glucose, UA: NEGATIVE mg/dL
HGB URINE DIPSTICK: NEGATIVE
KETONES UR: NEGATIVE mg/dL
Leukocytes, UA: NEGATIVE
Nitrite: NEGATIVE
PROTEIN: NEGATIVE mg/dL
SPECIFIC GRAVITY, URINE: 1.02 (ref 1.005–1.030)
UROBILINOGEN UA: 0.2 mg/dL (ref 0.0–1.0)
pH: 7 (ref 5.0–8.0)

## 2013-10-22 LAB — COMPREHENSIVE METABOLIC PANEL
ALBUMIN: 3.8 g/dL (ref 3.5–5.2)
ALK PHOS: 71 U/L (ref 39–117)
ALT: 27 U/L (ref 0–53)
AST: 21 U/L (ref 0–37)
BUN: 12 mg/dL (ref 6–23)
CO2: 26 mEq/L (ref 19–32)
Calcium: 9.6 mg/dL (ref 8.4–10.5)
Chloride: 102 mEq/L (ref 96–112)
Creatinine, Ser: 1.08 mg/dL (ref 0.50–1.35)
GFR calc Af Amer: >90 mL/min (ref >90)
GFR calc non Af Amer: 84 mL/min — ABNORMAL LOW (ref >90)
Glucose, Bld: 95 mg/dL (ref 70–99)
POTASSIUM: 4 meq/L (ref 3.7–5.3)
SODIUM: 138 meq/L (ref 137–147)
TOTAL PROTEIN: 7.8 g/dL (ref 6.0–8.3)
Total Bilirubin: 0.3 mg/dL (ref 0.3–1.2)

## 2013-10-22 LAB — CBC WITH DIFFERENTIAL/PLATELET
BASOS ABS: 0 10*3/uL (ref 0.0–0.1)
BASOS PCT: 0 % (ref 0–1)
EOS ABS: 0.3 10*3/uL (ref 0.0–0.7)
Eosinophils Relative: 4 % (ref 0–5)
HCT: 39.4 % (ref 39.0–52.0)
Hemoglobin: 13.5 g/dL (ref 13.0–17.0)
Lymphocytes Relative: 31 % (ref 12–46)
Lymphs Abs: 1.8 10*3/uL (ref 0.7–4.0)
MCH: 28.4 pg (ref 26.0–34.0)
MCHC: 34.3 g/dL (ref 30.0–36.0)
MCV: 82.8 fL (ref 78.0–100.0)
Monocytes Absolute: 0.5 10*3/uL (ref 0.1–1.0)
Monocytes Relative: 8 % (ref 3–12)
NEUTROS PCT: 57 % (ref 43–77)
Neutro Abs: 3.4 10*3/uL (ref 1.7–7.7)
PLATELETS: 242 10*3/uL (ref 150–400)
RBC: 4.76 MIL/uL (ref 4.22–5.81)
RDW: 12.5 % (ref 11.5–15.5)
WBC: 5.9 10*3/uL (ref 4.0–10.5)

## 2013-10-22 LAB — PROTIME-INR
INR: 0.98 (ref 0.00–1.49)
Prothrombin Time: 12.8 seconds (ref 11.6–15.2)

## 2013-10-22 LAB — D-DIMER, QUANTITATIVE: D-Dimer, Quant: 0.28 ug/mL-FEU (ref 0.00–0.48)

## 2013-10-22 LAB — PRO B NATRIURETIC PEPTIDE: Pro B Natriuretic peptide (BNP): 14.5 pg/mL (ref 0–125)

## 2013-10-22 LAB — APTT: aPTT: 29 seconds (ref 24–37)

## 2013-10-22 LAB — TROPONIN I

## 2013-10-22 NOTE — Progress Notes (Signed)
   Subjective:    Patient ID: Brendan HerterRobert W Macdonald, male    DOB: Apr 04, 1971, 43 y.o.   MRN: [id number]  HPI This 43 y.o. male presents for evaluation of having numbness and tingling in his left arm, shoulder, chest, and hands and feet.  He states he was having sex the other night and developed some numbness in his penis.  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  He still Has some numbness and tingling in his genital area, hands, feet, chest, and left arm.   Review of Systems C/o Chest pain and numbness   No chest pain, SOB, HA, dizziness, vision change, N/V, diarrhea, constipation, dysuria, urinary urgency or frequency, myalgias, arthralgias or rash.  Objective:   Physical Exam Vital signs noted  Well developed well nourished male.  HEENT - Head atraumatic Normocephalic                Eyes - PERRLA, Conjuctiva - clear Sclera- Clear EOMI                Ears - EAC's Wnl TM's Wnl Gross Hearing WNL                Nose - Nares patent                 Throat - oropharanx wnl Respiratory - Lungs CTA bilateral Cardiac - RRR S1 and S2 without murmur GI - Abdomen soft Nontender and bowel sounds active x 4 Extremities - No edema. Neuro - Grossly intact.       Assessment & Plan:  Chest pain Discussed with patient that he should be evaluated in the ED and recommend he get  To the ED with a driver and he leaves the office and recommended he follow up after ED.  Deatra CanterWilliam J Shatara Stanek FNP

## 2013-10-22 NOTE — ED Provider Notes (Signed)
CSN: [id number]     Arrival date & time 10/22/13  1303 History   First MD Initiated Contact with Patient 10/22/13 1322     Chief Complaint  Patient presents with  . multiple complaints.      (Consider location/radiation/quality/duration/timing/severity/associated sxs/prior Treatment) HPI Patient reports 5 days ago and 3 days ago he had some erectile dysfunction problems. He states he's never had that problem before. He denies taking any ED medications. He reports in January he was started on Lamisil for toenail fungus. He reports after that he had some difficulty urinating and he stopped taking it because his doctor told him that could be causing the difficulty urinating. He states he was having trouble starting to urinate. 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 He states he starting having any pain in his left radial aspect of his wrist that he describes as a "pinching pain up the artery". He states after a few minutes it started radiating up into his elbow and after another 5 or 6 minutes it went into his upper axilla region. He states nothing he did make the pain feel worse. However he stated he did notice he put his thumb and his axilla and laid on his left side the pain was improved. He states he also started getting a tingling/numb sensation in the dorsum of his feet and the top of his hands and in his groin which are still present. Is he also describes a left frontal headache that radiates to his nose that he describes as a shooting pain that he's had in the past for sinus headaches. He denies any nausea, vomiting, visual changes, chest pain, shortness of breath, abnormality of gait, back pain, abdominal pain. He states he had some mild dyspnea on exertion yesterday but not today. He states today he feels tired and weak. He denies any difficulty doing normal motor tasks such as dressing himself. He states he's never had this  before. Patient states he is right-handed.  Family history he states his maternal grandmother died in her late 2s/early 90s from a stroke. He states his father is currently alive at age 77 and has had a MI and bypass surgery. He states there is also a strong family history of hypertension.  PCP Dr Laurance Flatten  Past Medical History  Diagnosis Date  . GERD (gastroesophageal reflux disease)    Past Surgical History  Procedure Laterality Date  . Orthopedic surgery    . Back surgery    . Arthroscopy  Bilateral     Knees  . Rotator cuff repair Right   . Cholecystectomy N/A 02/24/2013    Procedure: LAPAROSCOPIC CHOLECYSTECTOMY;  Surgeon: Jamesetta So, MD;  Location: [facility];  Service: General;  Laterality: N/A;   Family History  Problem Relation Age of Onset  . Cancer Mother   . Diabetes Mother   . Heart disease Father   . Hypertension Father    History  Substance Use Topics  . Smoking status: Former Smoker    Quit date: 03/25/2010  . Smokeless tobacco: Not on file  . 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    Allergies  Oxycodone and Vicodin  Home Medications   Current Outpatient Rx  Name  Route  Sig  Dispense  Refill  .  fexofenadine-pseudoephedrine (ALLEGRA-D 24) 180-240 MG per 24 hr tablet   Oral   Take 1 tablet by mouth daily.   30 tablet   11   . fluticasone (FLONASE) 50 MCG/ACT nasal spray   Each Nare   Place 2 sprays into both nostrils daily.   16 g   11   . ketoconazole (NIZORAL) 2 % cream   Topical   Apply 1 application topically daily.   15 g   0   . meloxicam (MOBIC) 15 MG tablet   Oral   Take 1 tablet (15 mg total) by mouth daily.   30 tablet   11   . montelukast (SINGULAIR) 10 MG tablet   Oral   Take 1 tablet (10 mg total) by mouth at bedtime.   30 tablet   11   . terbinafine (LAMISIL) 250 MG  tablet   Oral   Take 1 tablet (250 mg total) by mouth daily.   30 tablet   3    BP 114/75  Pulse 88  Temp(Src) 98 F (36.7 C) (Oral)  Resp 18  Ht 6' 4" (1.93 m)  Wt 290 lb (131.543 kg)  BMI 35.31 kg/m2  SpO2 99%  Vital signs normal   Physical Exam  Nursing note and vitals reviewed. Constitutional: He is oriented to person, place, and time. He appears well-developed and well-nourished.  Non-toxic appearance. He does not appear ill. No distress.  HENT:  Head: Normocephalic and atraumatic.  Right Ear: External ear normal.  Left Ear: External ear normal.  Nose: Nose normal. No mucosal edema or rhinorrhea.  Mouth/Throat: Oropharynx is clear and moist and mucous membranes are normal. No dental abscesses or uvula swelling.  Eyes: Conjunctivae and EOM are normal. Pupils are equal, round, and reactive to light.  Neck: Normal range of motion and full passive range of motion without pain. Neck supple.  Cardiovascular: Normal rate, regular rhythm and normal heart sounds.  Exam reveals no gallop and no friction rub.   No murmur heard. Pulmonary/Chest: Effort normal and breath sounds normal. No respiratory distress. He has no wheezes. He has no rhonchi. He has no rales. He exhibits no tenderness and no crepitus.  Abdominal: Soft. Normal appearance and bowel sounds are normal. He exhibits no distension. There is no tenderness. There is no rebound and no guarding.  Musculoskeletal: Normal range of motion. He exhibits no edema and no tenderness.  Moves all extremities well. Patient's back is nontender to palpation  Neurological: He is alert and oriented to person, place, and time. He has normal strength. No cranial nerve deficit.  No pronator drift, no cranial nerve deficit, no weakness of his grips which are equal bilaterally, no motor and deficit noted. Patient has no subjective numbness to his face, or dorsum of his hands.  Skin: Skin is warm, dry and intact. No rash noted. No erythema. No  pallor.  Psychiatric: He has a normal mood and affect. His speech is normal and behavior is normal. His mood appears not anxious.    ED Course  Procedures (including critical care time)  Pt given his test results. He is being scheduled for outpatient MR of brain. He has atypical numbness not in dermatomes or expected patterns for stroke. MR done to see if he has lesions c/w MS or some other neurological disorder.   Pt is being referred to urology for his c/o recent difficulty urinating and then erectile dysfunction.    Results for orders placed during the hospital encounter of  10/22/13  CBC WITH DIFFERENTIAL      Result Value Ref Range   WBC 5.9  4.0 - 10.5 K/uL   RBC 4.76  4.22 - 5.81 MIL/uL   Hemoglobin 13.5  13.0 - 17.0 g/dL   HCT 39.4  39.0 - 52.0 %   MCV 82.8  78.0 - 100.0 fL   MCH 28.4  26.0 - 34.0 pg   MCHC 34.3  30.0 - 36.0 g/dL   RDW 12.5  11.5 - 15.5 %   Platelets 242  150 - 400 K/uL   Neutrophils Relative % 57  43 - 77 %   Neutro Abs 3.4  1.7 - 7.7 K/uL   Lymphocytes Relative 31  12 - 46 %   Lymphs Abs 1.8  0.7 - 4.0 K/uL   Monocytes Relative 8  3 - 12 %   Monocytes Absolute 0.5  0.1 - 1.0 K/uL   Eosinophils Relative 4  0 - 5 %   Eosinophils Absolute 0.3  0.0 - 0.7 K/uL   Basophils Relative 0  0 - 1 %   Basophils Absolute 0.0  0.0 - 0.1 K/uL  COMPREHENSIVE METABOLIC PANEL      Result Value Ref Range   Sodium 138  137 - 147 mEq/L   Potassium 4.0  3.7 - 5.3 mEq/L   Chloride 102  96 - 112 mEq/L   CO2 26  19 - 32 mEq/L   Glucose, Bld 95  70 - 99 mg/dL   BUN 12  6 - 23 mg/dL   Creatinine, Ser 1.08  0.50 - 1.35 mg/dL   Calcium 9.6  8.4 - 10.5 mg/dL   Total Protein 7.8  6.0 - 8.3 g/dL   Albumin 3.8  3.5 - 5.2 g/dL   AST 21  0 - 37 U/L   ALT 27  0 - 53 U/L   Alkaline Phosphatase 71  39 - 117 U/L   Total Bilirubin 0.3  0.3 - 1.2 mg/dL   GFR calc non Af Amer 84 (*) >90 mL/min   GFR calc Af Amer >90  >90 mL/min  TROPONIN I      Result Value Ref Range   Troponin I  <0.30  <0.30 ng/mL  APTT      Result Value Ref Range   aPTT 29  24 - 37 seconds  PROTIME-INR      Result Value Ref Range   Prothrombin Time 12.8  11.6 - 15.2 seconds   INR 0.98  0.00 - 1.49  D-DIMER, QUANTITATIVE      Result Value Ref Range   D-Dimer, Quant 0.28  0.00 - 0.48 ug/mL-FEU  URINALYSIS, ROUTINE W REFLEX MICROSCOPIC      Result Value Ref Range   Color, Urine YELLOW  YELLOW   APPearance CLEAR  CLEAR   Specific Gravity, Urine 1.020  1.005 - 1.030   pH 7.0  5.0 - 8.0   Glucose, UA NEGATIVE  NEGATIVE mg/dL   Hgb urine dipstick NEGATIVE  NEGATIVE   Bilirubin Urine NEGATIVE  NEGATIVE   Ketones, ur NEGATIVE  NEGATIVE mg/dL   Protein, ur NEGATIVE  NEGATIVE mg/dL   Urobilinogen, UA 0.2  0.0 - 1.0 mg/dL   Nitrite NEGATIVE  NEGATIVE   Leukocytes, UA NEGATIVE  NEGATIVE  PRO B NATRIURETIC PEPTIDE      Result Value Ref Range   Pro B Natriuretic peptide (BNP) 14.5  0 - 125 pg/mL   Laboratory interpretation all normal   Dg Chest 2 View  10/22/2013   CLINICAL DATA:  Short of breath.  Weakness.  Lethargy.  EXAM: CHEST  2 VIEW  COMPARISON:  None.  FINDINGS: The heart size and mediastinal contours are within normal limits. Both lungs are clear. The visualized skeletal structures are unremarkable.  IMPRESSION: No active cardiopulmonary disease.   Electronically Signed   By: Lajean Manes M.D.   On: 10/22/2013 14:21   Ct Head Wo Contrast  10/22/2013   CLINICAL DATA:  Left-sided headache with bilateral hand and feet numbness for 1 day.  EXAM: CT HEAD WITHOUT CONTRAST  TECHNIQUE: Contiguous axial images were obtained from the base of the skull through the vertex without intravenous contrast.  COMPARISON:  None.  FINDINGS: Sinuses/Soft tissues: Clear paranasal sinuses and mastoid air cells.  Intracranial: No mass lesion, hemorrhage, hydrocephalus, acute infarct, intra-axial, or extra-axial fluid collection.  IMPRESSION: Normal head CT.   Electronically Signed   By: Abigail Miyamoto M.D.   On:  10/22/2013 14:24       Labs Review Results for orders placed in visit on 10/05/13  HEPATIC FUNCTION PANEL      Result Value Ref Range   Total Protein 6.9  6.0 - 8.5 g/dL   Albumin 4.2  3.5 - 5.5 g/dL   Total Bilirubin 0.3  0.0 - 1.2 mg/dL   Bilirubin, Direct 0.07  0.00 - 0.40 mg/dL   Alkaline Phosphatase 73  39 - 117 IU/L   AST 25  0 - 40 IU/L   ALT 35  0 - 44 IU/L  PSA, TOTAL AND FREE      Result Value Ref Range   PSA 0.6  0.0 - 4.0 ng/mL   PSA, Free 0.23  N/A ng/mL   PSA, Free Pct 38.3    BMP8+EGFR      Result Value Ref Range   Glucose 107 (*) 65 - 99 mg/dL   BUN 13  6 - 24 mg/dL   Creatinine, Ser 1.09  0.76 - 1.27 mg/dL   GFR calc non Af Amer 84  >59 mL/min/1.73   GFR calc Af Amer 97  >59 mL/min/1.73   BUN/Creatinine Ratio 12  9 - 20   Sodium 140  134 - 144 mmol/L   Potassium 4.2  3.5 - 5.2 mmol/L   Chloride 100  97 - 108 mmol/L   CO2 27  18 - 29 mmol/L   Calcium 9.6  8.7 - 10.2 mg/dL  TSH      Result Value Ref Range   TSH 0.973  0.450 - 4.500 uIU/mL      Imaging Review Dg Chest 2 View  10/22/2013   CLINICAL DATA:  Short of breath.  Weakness.  Lethargy.  EXAM: CHEST  2 VIEW  COMPARISON:  None.  FINDINGS: The heart size and mediastinal contours are within normal limits. Both lungs are clear. The visualized skeletal structures are unremarkable.  IMPRESSION: No active cardiopulmonary disease.   Electronically Signed   By: Lajean Manes M.D.   On: 10/22/2013 14:21   Ct Head Wo Contrast  10/22/2013   CLINICAL DATA:  Left-sided headache with bilateral hand and feet numbness for 1 day.  EXAM: CT HEAD WITHOUT CONTRAST  TECHNIQUE: Contiguous axial images were obtained from the base of the skull through the vertex without intravenous contrast.  COMPARISON:  None.  FINDINGS: Sinuses/Soft tissues: Clear paranasal sinuses and mastoid air cells.  Intracranial: No mass lesion, hemorrhage, hydrocephalus, acute infarct, intra-axial, or extra-axial fluid collection.  IMPRESSION: Normal  head CT.  Electronically Signed   By: Abigail Miyamoto M.D.   On: 10/22/2013 14:24     EKG Interpretation   Date/Time:  Saturday October 22 2013 14:31:36 EST Ventricular Rate:  76 PR Interval:  132 QRS Duration: 98 QT Interval:  364 QTC Calculation: 409 R Axis:   14 Text Interpretation:  Normal sinus rhythm Normal ECG When compared with  ECG of 23-Feb-2013 07:26, No significant change was found Confirmed by  KNAPP  MD-I, IVA ([id number]) on 10/22/2013 2:49:06 PM      MDM   Final diagnoses:  Numbness and tingling in hands  Numbness and tingling of foot  Erectile dysfunction    Plan discharge  Rolland Porter, MD, Alanson Aly, MD 10/22/13 [id number]

## 2013-10-22 NOTE — ED Notes (Signed)
MD at bedside. 

## 2013-10-22 NOTE — Discharge Instructions (Signed)
You are scheduled for your brain MR on Tuesday, March 3rd at 6 pm. Arrive 15 minutes early to register. You can either wait to be given the results by the ED doctor (it will take awhile for the radiologist to read the scan), or have your doctor get the results and call you the next day. If your MRI scan shows a problem, you will need to be seen by a Neurologist. I gave you Dr Ronal Fearoonquah's office number.  I gave you two Urology groups to call to be evaluated for your erectile dysfunction. Call and make an appointment.    Erectile Dysfunction Erectile dysfunction is the inability to get or sustain a good enough erection to have sexual intercourse. Erectile dysfunction may involve:  Inability to get an erection.  Lack of enough hardness to allow penetration.  Loss of the erection before sex is finished.  Premature ejaculation. CAUSES  Certain drugs, such as:  Pain relievers.  Antihistamines.  Antidepressants.  Blood pressure medicines.  Water pills (diuretics).  Ulcer medicines.  Muscle relaxants.  Illegal drugs.  Excessive drinking.  Psychological causes, such as:  Anxiety.  Depression.  Sadness.  Exhaustion.  Performance fear.  Stress.  Physical causes, such as:  Artery problems. This may include diabetes, smoking, liver disease, or atherosclerosis.  High blood pressure.  Hormonal problems, such as low testosterone.  Obesity.  Nerve problems. This may include back or pelvic injuries, diabetes mellitus, multiple sclerosis, or Parkinson disease. SYMPTOMS  Inability to get an erection.  Lack of enough hardness to allow penetration.  Loss of the erection before sex is finished.  Premature ejaculation.  Normal erections at some times, but with frequent unsatisfactory episodes.  Orgasms that are not satisfactory in sensation or frequency.  Low sexual satisfaction in either partner because of erection problems.  A curved penis occurring with erection.  The curve may cause pain or may be too curved to allow for intercourse.  Never having nighttime erections. DIAGNOSIS Your caregiver can often diagnose this condition by:  Performing a physical exam to find other diseases or specific problems with the penis.  Asking you detailed questions about the problem.  Performing blood tests to check for diabetes mellitus or to measure hormone levels.  Performing urine tests to find other underlying health conditions.  Performing an ultrasound exam to check for scarring.  Performing a test to check blood flow to the penis.  Doing a sleep study at home to measure nighttime erections. TREATMENT   You may be prescribed medicines by mouth.  You may be given medicine injections into the penis.  You may be prescribed a vacuum pump with a ring.  Penile implant surgery may be performed. You may receive:  An inflatable implant.  A semirigid implant.  Blood vessel surgery may be performed. HOME CARE INSTRUCTIONS  If you are prescribed oral medicine, you should take the medicine as prescribed. Do not increase the dosage without first discussing it with your physician.  If you are using self-injections, be careful to avoid any veins that are on the surface of the penis. Apply pressure to the injection site for 5 minutes.  If you are using a vacuum pump, make sure you have read the instructions before using it. Discuss any questions with your physician before taking the pump home. SEEK MEDICAL CARE IF:  You experience pain that is not responsive to the pain medicine you have been prescribed.  You experience nausea or vomiting. SEEK IMMEDIATE MEDICAL CARE IF:  When taking oral or injectable medications, you experience an erection that lasts longer than 4 hours. If your physician is unavailable, go to the nearest emergency room for evaluation. An erection that lasts much longer than 4 hours can result in permanent damage to your penis.  You  have pain that is severe.  You develop redness, severe pain, or severe swelling of your penis.  You have redness spreading up into your groin or lower abdomen.  You are unable to pass your urine. Document Released: 08/08/2000 Document Revised: 04/13/2013 Document Reviewed: 01/13/2013 [facility] Patient Information 2014 [place], [place].  Paresthesia Paresthesia is an abnormal burning or prickling sensation. This sensation is generally felt in the hands, arms, legs, or feet. However, it may occur in any part of the body. It is usually not painful. The feeling may be described as:  Tingling or numbness.  "Pins and needles."  Skin crawling.  Buzzing.  Limbs "falling asleep."  Itching. Most people experience temporary (transient) paresthesia at some time in their lives. CAUSES  Paresthesia may occur when you breathe too quickly (hyperventilation). It can also occur without any apparent cause. Commonly, paresthesia occurs when pressure is placed on a nerve. The feeling quickly goes away once the pressure is removed. For some people, however, paresthesia is a long-lasting (chronic) condition caused by an underlying disorder. The underlying disorder may be:  A traumatic, direct injury to nerves. Examples include a:  Broken (fractured) neck.  Fractured skull.  A disorder affecting the brain and spinal cord (central nervous system). Examples include:  Transverse myelitis.  Encephalitis.  Transient ischemic attack.  Multiple sclerosis.  Stroke.  Tumor or blood vessel problems, such as an arteriovenous malformation pressing against the brain or spinal cord.  A condition that damages the peripheral nerves (peripheral neuropathy). Peripheral nerves are not part of the brain and spinal cord. These conditions include:  Diabetes.  Peripheral vascular disease.  Nerve entrapment syndromes, such as carpal tunnel syndrome.  Shingles.  Hypothyroidism.  Vitamin B12  deficiencies.  Alcoholism.  Heavy metal poisoning (lead, arsenic).  Rheumatoid arthritis.  Systemic lupus erythematosus. DIAGNOSIS  Your caregiver will attempt to find the underlying cause of your paresthesia. Your caregiver may:  Take your medical history.  Perform a physical exam.  Order various lab tests.  Order imaging tests. TREATMENT  Treatment for paresthesia depends on the underlying cause. HOME CARE INSTRUCTIONS  Avoid drinking alcohol.  You may consider massage or acupuncture to help relieve your symptoms.  Keep all follow-up appointments as directed by your caregiver. SEEK IMMEDIATE MEDICAL CARE IF:   You feel weak.  You have trouble walking or moving.  You have problems with speech or vision.  You feel confused.  You cannot control your bladder or bowel movements.  You feel numbness after an injury.  You faint.  Your burning or prickling feeling gets worse when walking.  You have pain, cramps, or dizziness.  You develop a rash. MAKE SURE YOU:  Understand these instructions.  Will watch your condition.  Will get help right away if you are not doing well or get worse. Document Released: 08/01/2002 Document Revised: 11/03/2011 Document Reviewed: 05/02/2011 [facility] Patient Information 2014 [place], [place].

## 2013-10-22 NOTE — ED Notes (Addendum)
Pt states tingling in hands, feet, and groin area. States headache and generalized weakness. Pt states [place] ([employer] he noticed a sharp pain "in the artery of left wrist" and pain gradually moved up his arm to arm pit. Pressure on the area helped relieve the pain. Pt also states, "I feel like I'm congested" Pt also states heart burn (hx of GERD, and feels like past episodes of heart burn). Muscle spasms all over body.

## 2013-10-25 ENCOUNTER — Ambulatory Visit (HOSPITAL_COMMUNITY): Payer: BC Managed Care – PPO

## 2013-10-26 ENCOUNTER — Ambulatory Visit (HOSPITAL_COMMUNITY): Admit: 2013-10-26 | Payer: BC Managed Care – PPO

## 2013-11-02 ENCOUNTER — Ambulatory Visit (HOSPITAL_COMMUNITY): Payer: BC Managed Care – PPO

## 2014-09-27 ENCOUNTER — Emergency Department (HOSPITAL_COMMUNITY): Payer: BLUE CROSS/BLUE SHIELD

## 2014-09-27 ENCOUNTER — Encounter (HOSPITAL_COMMUNITY): Payer: Self-pay | Admitting: *Deleted

## 2014-09-27 ENCOUNTER — Emergency Department (HOSPITAL_COMMUNITY)
Admission: EM | Admit: 2014-09-27 | Discharge: 2014-09-28 | Disposition: A | Discharge status: Home / Self Care | Payer: BLUE CROSS/BLUE SHIELD | Attending: Emergency Medicine | Admitting: Emergency Medicine

## 2014-09-27 DIAGNOSIS — Z791 Long term (current) use of non-steroidal anti-inflammatories (NSAID): Secondary | ICD-10-CM | POA: Insufficient documentation

## 2014-09-27 DIAGNOSIS — K219 Gastro-esophageal reflux disease without esophagitis: Secondary | ICD-10-CM | POA: Diagnosis not present

## 2014-09-27 DIAGNOSIS — Y9389 Activity, other specified: Secondary | ICD-10-CM | POA: Diagnosis not present

## 2014-09-27 DIAGNOSIS — S8991XA Unspecified injury of right lower leg, initial encounter: Secondary | ICD-10-CM | POA: Diagnosis not present

## 2014-09-27 DIAGNOSIS — Z79899 Other long term (current) drug therapy: Secondary | ICD-10-CM | POA: Diagnosis not present

## 2014-09-27 DIAGNOSIS — Z87891 Personal history of nicotine dependence: Secondary | ICD-10-CM | POA: Insufficient documentation

## 2014-09-27 DIAGNOSIS — W228XXA Striking against or struck by other objects, initial encounter: Secondary | ICD-10-CM | POA: Diagnosis not present

## 2014-09-27 DIAGNOSIS — Y998 Other external cause status: Secondary | ICD-10-CM | POA: Diagnosis not present

## 2014-09-27 DIAGNOSIS — Y9289 Other specified places as the place of occurrence of the external cause: Secondary | ICD-10-CM | POA: Insufficient documentation

## 2014-09-27 MED ORDER — HYDROMORPHONE HCL 2 MG PO TABS: 2.0000 mg | ORAL_TABLET | Freq: Four times a day (QID) | ORAL | Status: DC | PRN

## 2014-09-27 MED ORDER — HYDROMORPHONE HCL 1 MG/ML IJ SOLN
1.0000 mg | Freq: Once | INTRAMUSCULAR | Status: AC
  Administered 2014-09-27: 1 mg via INTRAMUSCULAR
  Filled 2014-09-27: qty 1

## 2014-09-27 MED ORDER — HYDROMORPHONE HCL 1 MG/ML IJ SOLN
1.0000 mg | Freq: Once | INTRAMUSCULAR | Status: AC
  Administered 2014-09-28: 1 mg via INTRAMUSCULAR
  Filled 2014-09-27: qty 1
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
## 2014-09-27 NOTE — ED Provider Notes (Signed)
CSN: [id number]     Arrival date & time 09/27/14  1940 History   First MD Initiated Contact with Patient 09/27/14 2326     Chief Complaint  Patient presents with  . Knee Pain     (Consider location/radiation/quality/duration/timing/severity/associated sxs/prior Treatment) HPI   Brendan Macdonald is a 44 y.o. male who presents to the Emergency Department complaining of sudden onset of right knee pain this evening after a direct blow to the anterior knee.  He states that he felt his kneecap shift and was concerned that it was dislocated.  He also noticed a change in the appearance of his knee noting that he now has a "dip" just above the kneecap and "looks like my kneecap is sticking out."  He reports that he is unable to bear weight to the affected extremity and pain is worse with attempted flexion.  Patient had a quadriceps tendon tear on the left in 2012 that was surgically repaired and notes the pain tonight feels similar.  He also c/o swelling to the knee.  He has applied ice without relief.  He denies hip pain, numbness or the extremity or pain distal to the knee.     Past Medical History  Diagnosis Date  . GERD (gastroesophageal reflux disease)    Past Surgical History  Procedure Laterality Date  . Orthopedic surgery    . Back surgery    . Arthroscopy  Bilateral     Knees  . Rotator cuff repair Right   . Cholecystectomy N/A 02/24/2013    Procedure: LAPAROSCOPIC CHOLECYSTECTOMY;  Surgeon: Dalia HeadingMark A Jenkins, MD;  Location: [facility];  Service: General;  Laterality: N/A;   Family History  Problem Relation Age of Onset  . Cancer Mother   . Diabetes Mother   . Heart disease Father   . Hypertension Father    History  Substance Use Topics  . Smoking status: Former Smoker    Quit date: 03/25/2010  . Smokeless tobacco: Not on file  . Alcohol Use: Yes     Comment: Occasionally    Review of Systems  Constitutional: Negative for fever and chills.  Gastrointestinal: Negative for nausea  and vomiting.  Musculoskeletal: Positive for joint swelling and arthralgias (right knee pain and swelling). Negative for neck pain.  Skin: Negative for color change and wound.  Neurological: Negative for weakness and numbness.  All other systems reviewed and are negative.     Allergies  Oxycodone and Vicodin  Home Medications   Prior to Admission medications   Medication Sig Start Date End Date Taking? Authorizing Provider  fexofenadine-pseudoephedrine (ALLEGRA-D 24) 180-240 MG per 24 hr tablet Take 1 tablet by mouth daily. 10/05/13  Yes Deatra CanterWilliam J Oxford, FNP  fluticasone (FLONASE) 50 MCG/ACT nasal spray Place 2 sprays into both nostrils daily. 10/05/13  Yes Deatra CanterWilliam J Oxford, FNP  meloxicam (MOBIC) 15 MG tablet Take 1 tablet (15 mg total) by mouth daily. 09/21/13  Yes Deatra CanterWilliam J Oxford, FNP  ranitidine (ZANTAC) 150 MG tablet Take 150 mg by mouth 2 (two) times daily.   Yes Historical Provider, MD  ketoconazole (NIZORAL) 2 % cream Apply 1 application topically daily. Patient not taking: Reported on 09/27/2014 10/04/13   Deatra CanterWilliam J Oxford, FNP  montelukast (SINGULAIR) 10 MG tablet Take 1 tablet (10 mg total) by mouth at bedtime. Patient not taking: Reported on 09/27/2014 09/21/13   Deatra CanterWilliam J Oxford, FNP  terbinafine (LAMISIL) 250 MG tablet Take 1 tablet (250 mg total) by mouth daily. Patient not taking:  Reported on 09/27/2014 09/21/13   Anselm Pancoast Oxford, FNP   BP 125/90 mmHg  Pulse 79  Temp(Src) 98.1 F (36.7 C) (Oral)  Resp 18  Ht  (1.956 m)  Wt 285 lb (129.275 kg)  BMI 33.79 kg/m2  SpO2 97% Physical Exam  Constitutional: He is oriented to person, place, and time. He appears well-developed and well-nourished. No distress.  HENT:  Head: Normocephalic and atraumatic.  Cardiovascular: Normal rate, regular rhythm, normal heart sounds and intact distal pulses.   No murmur heard. Pulmonary/Chest: Effort normal and breath sounds normal. No respiratory distress.  Musculoskeletal: He exhibits  edema and tenderness.       Right knee: He exhibits decreased range of motion, swelling and deformity. He exhibits no ecchymosis, no laceration and no erythema.       Legs: Step off deformity of the quadriceps tendon.  Mild to moderate edema present, without obvious effusion.  Limited flexion due to level of pain. Mild tenderness medially  Neurological: He is alert and oriented to person, place, and time. He exhibits normal muscle tone. Coordination normal.  Skin: Skin is warm and dry. No erythema.  Nursing note and vitals reviewed.   ED Course  Procedures (including critical care time) Labs Review Labs Reviewed - No data to display  Imaging Review Dg Knee Complete 4 Views Right  09/27/2014   CLINICAL DATA:  Right knee pain after injury earlier the same day.  EXAM: RIGHT KNEE - COMPLETE 4+ VIEW  COMPARISON:  None.  FINDINGS: No fracture or dislocation. The alignment and joint spaces are maintained. Mild spurring of the tibial spines and trace peripheral osteophytes. There is a 1.9 x1.2 cm osteochondroma arising from the distal medial femoral condyle. Small quadriceps tendon enthesophyte. There is chronic fragmentation at the tibial insertion of the patellar tendon. Moderate joint effusion. There is anterior soft tissue edema.  IMPRESSION: 1. Joint effusion.  No acute fracture or dislocation. 2. Mild osteoarthritis. 3. Osteochondroma arising from the medial femoral condyle. No further follow-up is needed unless there was focal pain in this region prior to injury.   Electronically Signed   By: Rubye Oaks M.D.   On: 09/27/2014 21:15     EKG Interpretation None      MDM   Final diagnoses:  Knee injury, right, initial encounter   Knee immobilizer and crutches, pain improved.  Remains NV intact   Pt with likely right quadriceps tendon tear.  Has hx of similar injury on left in 2012 repair by Dr. Carola Frost, pt prefers to f/u with him again.  I have advised him of the diagnosis and  importance to remain non-weight bearing.  Agrees to arrange orthopedic follow-up, elevate and ice.      Haizel Gatchell L. Trisha Mangle, PA-C 09/28/14 [id number]  Joya Gaskins, MD 09/28/14 [phone]

## 2014-09-28 NOTE — ED Notes (Signed)
Knee immobilizer placed on right knee, crutches given and instructions on use, pt demonstrated use of crutches w/o any difficulties.

## 2014-09-30 ENCOUNTER — Other Ambulatory Visit: Payer: BLUE CROSS/BLUE SHIELD | Admitting: Radiology

## 2014-09-30 ENCOUNTER — Ambulatory Visit (HOSPITAL_COMMUNITY)
Admission: RE | Admit: 2014-09-30 | Discharge: 2014-09-30 | Disposition: A | Discharge status: Home / Self Care | Payer: BLUE CROSS/BLUE SHIELD | Source: Ambulatory Visit | Attending: Sports Medicine | Admitting: Sports Medicine

## 2014-09-30 DIAGNOSIS — Z139 Encounter for screening, unspecified: Secondary | ICD-10-CM

## 2014-09-30 DIAGNOSIS — Z1389 Encounter for screening for other disorder: Secondary | ICD-10-CM | POA: Insufficient documentation

## 2014-10-10 NOTE — Progress Notes (Signed)
Please put orders in Epic surgery 10-18-14 pre op 10-13-14 Thanks

## 2014-10-11 ENCOUNTER — Ambulatory Visit: Payer: Self-pay | Admitting: Orthopedic Surgery

## 2014-10-11 NOTE — H&P (Signed)
Brendan Macdonald is an 44 y.o. male.   Chief Complaint: Right knee pain HPI: The patient is a 44 year old male who presents with knee complaints. The patient is seen today for a surgical consult and in referral from Dr. Drema Dallas. The patient reports right knee symptoms which began 9 day(s) ago following a specific injury. The injury occurred 09/27/2014 due to a direct impact while the patient was engaged in recreational activities. Prior to being seen today the patient was previously evaluated by by Dr. Drema Dallas. Previous work-up for this problem has included knee x-rays and knee MRI. Past treatment for this problem has included crutches (non-weight bearing) and opioid analgesics. Symptoms are reported to be located in the right knee and include knee pain, swelling and instability. Current treatment includes crutches (partial weight bearing). Note for "Knee pain": The patient is currently out of work.  Past Medical History  Diagnosis Date  . GERD (gastroesophageal reflux disease)     Past Surgical History  Procedure Laterality Date  . Orthopedic surgery    . Back surgery    . Arthroscopy  Bilateral     Knees  . Rotator cuff repair Right   . Cholecystectomy N/A 02/24/2013    Procedure: LAPAROSCOPIC CHOLECYSTECTOMY;  Surgeon: Jamesetta So, MD;  Location: [facility];  Service: General;  Laterality: N/A;    Family History  Problem Relation Age of Onset  . Cancer Mother   . Diabetes Mother   . Heart disease Father   . Hypertension Father    Social History:  reports that he quit smoking about 4 years ago. He does not have any smokeless tobacco history on file. He reports that he drinks alcohol. He reports that he does not use illicit drugs.  Allergies:  Allergies  Allergen Reactions  . Oxycodone Shortness Of Breath and Other (See Comments)    Hallucinations  . Vicodin [Hydrocodone-Acetaminophen] Shortness Of Breath and Other (See Comments)    Hallucinations     (Not in a hospital  admission)  No results found for this or any previous visit (from the past 48 hour(s)). No results found.  Review of Systems  Constitutional: Negative.   HENT: Negative.   Eyes: Negative.   Respiratory: Negative.   Cardiovascular: Negative.   Gastrointestinal: Negative.   Genitourinary: Negative.   Musculoskeletal: Positive for joint pain.  Skin: Negative.   Neurological: Negative.   Psychiatric/Behavioral: Negative.     There were no vitals taken for this visit. Physical Exam  Constitutional: He is oriented to person, place, and time. He appears well-developed and well-nourished.  HENT:  Head: Normocephalic and atraumatic.  Eyes: Conjunctivae are normal. Pupils are equal, round, and reactive to light.  Neck: Normal range of motion. Neck supple.  Cardiovascular: Normal rate and regular rhythm.   Respiratory: Effort normal and breath sounds normal.  GI: Soft. Bowel sounds are normal.  Musculoskeletal:  On exam, he has an extensor lag of 30 degrees. He has a defect in the quadriceps tendon. Trace knee effusion. Tender in the medial joint line. Patellofemoral pain with compression. Ipsilateral hip and ankle exams are unremarkable. No evidence of DVT.  Neurological: He is alert and oriented to person, place, and time. He has normal reflexes.  Skin: Skin is warm and dry.    The x-rays demonstrate osteophyte of the superior pole of the patella and degenerative changes consistent with old Osgood-Schlatter's disease.  MRI demonstrates quad tendon tear with 12 mm. of retraction, severe patellar tendinosis, chronic Osgood-Schlatter's  disease and moderate joint effusion.  Assessment/Plan 1. Quad tendon tear, full thickness, retracted. 2. Possible enthesopathy. 3. Patient reports recent mechanism of injury. He also had it back in the summer at work. He had more troubles with it following that. There may be a progression of that; however, at this point he is full thickness  and retracted.  Plans Transcription(Jeffrey Windy Kalata, MD; 10/09/2014 7:31 AM) We will proceed accordingly. Knee immobilizer. Preferred a T-ROM. We discussed the post op. course in detail as well as repair, risks and benefits of bleeding, infection, suboptimal ROM, time out of work, DVT, PE, anesthetic complications, etc. I will also obtain a sed rate, CMC, HLA-B27. He's had a quad rupture on the left side as well, interestingly. I appreciate the kind referral by Dr. Drema Dallas and his timely diagnosis of this quadricep tendon tear.  Plan Right quadricep tendon repair  Brendan Macdonald M. PA-C for Dr. Tonita Cong 10/11/2014, 4:50 PM

## 2014-10-11 NOTE — Patient Instructions (Addendum)
Brendan HerterRobert W Macdonald  10/11/2014   Your procedure is scheduled on: 10/18/2014    [facility] and follow signs to               Short Stay Center at      0800 AM.  Call this number if you have problems the morning of surgery [phone]   Remember:  Do not eat food or drink liquids :After Midnight.     Take these medicines the morning of surgery with A SIP OF WATER: Allegra, Flonase, Dilaudid if needed, Zantac                                You may not have any metal on your body including hair pins and              piercings  Do not wear jewelry, , lotions, powders or perfumes., deodorant.                            Men may shave face and neck.   Do not bring valuables to the hospital. [facility] IS NOT             RESPONSIBLE   FOR VALUABLES.  Contacts, dentures or bridgework may not be worn into surgery.  Leave suitcase in the car. After surgery it may be brought to your room.         Special Instructions:coughing and deep breathing exercises, leg exercises               Please read over the following fact sheets you were given: _____________________________________________________________________             [facility] - Preparing [facility] Before surgery, you can play an important role.  Because skin is not sterile, your skin needs to be as free of germs as possible.  You can reduce the number of germs on your skin by washing with CHG (chlorahexidine gluconate) soap before surgery.  CHG is an antiseptic cleaner which kills germs and bonds with the skin to continue killing germs even after washing. Please DO NOT use if you have an allergy to CHG or antibacterial soaps.  If your skin becomes reddened/irritated stop using the CHG and inform your nurse when you arrive at Short Stay. Do not shave (including legs and underarms) for at least 48 hours prior to the first CHG shower.  You may shave your face/neck. Please follow these  instructions carefully:  1.  Shower with CHG Soap the night before surgery and the  morning of Surgery.  2.  If you choose to wash your hair, wash your hair first as usual with your  normal  shampoo.  3.  After you shampoo, rinse your hair and body thoroughly to remove the  shampoo.                           4.  Use CHG as you would any other liquid soap.  You can apply chg directly  to the skin and wash                       Gently with a scrungie or clean washcloth.  5.  Apply the CHG  Soap to your body ONLY FROM THE NECK DOWN.   Do not use on face/ open                           Wound or open sores. Avoid contact with eyes, ears mouth and genitals (private parts).                       Wash face,  Genitals (private parts) with your normal soap.             6.  Wash thoroughly, paying special attention to the area where your surgery  will be performed.  7.  Thoroughly rinse your body with warm water from the neck down.  8.  DO NOT shower/wash with your normal soap after using and rinsing off  the CHG Soap.                9.  Pat yourself dry with a clean towel.            10.  Wear clean pajamas.            11.  Place clean sheets on your bed the night of your first shower and do not  sleep with pets. Day of Surgery : Do not apply any lotions/deodorants the morning of surgery.  Please wear clean clothes to the hospital/surgery center.  FAILURE TO FOLLOW THESE INSTRUCTIONS MAY RESULT IN THE CANCELLATION OF YOUR SURGERY PATIENT SIGNATURE_________________________________  NURSE SIGNATURE__________________________________  ________________________________________________________________________   Brendan Macdonald  An incentive spirometer is a tool that can help keep your lungs clear and active. This tool measures how well you are filling your lungs with each breath. Taking long deep breaths may help reverse or decrease the chance of developing breathing (pulmonary) problems (especially  infection) following:  A long period of time when you are unable to move or be active. BEFORE THE PROCEDURE   If the spirometer includes an indicator to show your best effort, your nurse or respiratory therapist will set it to a desired goal.  If possible, sit up straight or lean slightly forward. Try not to slouch.  Hold the incentive spirometer in an upright position. INSTRUCTIONS FOR USE  1. Sit on the edge of your bed if possible, or sit up as far as you can in bed or on a chair. 2. Hold the incentive spirometer in an upright position. 3. Breathe out normally. 4. Place the mouthpiece in your mouth and seal your lips tightly around it. 5. Breathe in slowly and as deeply as possible, raising the piston or the ball toward the top of the column. 6. Hold your breath for 3-5 seconds or for as long as possible. Allow the piston or ball to fall to the bottom of the column. 7. Remove the mouthpiece from your mouth and breathe out normally. 8. Rest for a few seconds and repeat Steps 1 through 7 at least 10 times every 1-2 hours when you are awake. Take your time and take a few normal breaths between deep breaths. 9. The spirometer may include an indicator to show your best effort. Use the indicator as a goal to work toward during each repetition. 10. After each set of 10 deep breaths, practice coughing to be sure your lungs are clear. If you have an incision (the cut made at the time of surgery), support your incision when coughing by placing a pillow or rolled up towels  firmly against it. Once you are able to get out of bed, walk around indoors and cough well. You may stop using the incentive spirometer when instructed by your caregiver.  RISKS AND COMPLICATIONS  Take your time so you do not get dizzy or light-headed.  If you are in pain, you may need to take or ask for pain medication before doing incentive spirometry. It is harder to take a deep breath if you are having pain. AFTER  USE  Rest and breathe slowly and easily.  It can be helpful to keep track of a log of your progress. Your caregiver can provide you with a simple table to help with this. If you are using the spirometer at home, follow these instructions: SEEK MEDICAL CARE IF:   You are having difficultly using the spirometer.  You have trouble using the spirometer as often as instructed.  Your pain medication is not giving enough relief while using the spirometer.  You develop fever of 100.5 F (38.1 C) or higher. SEEK IMMEDIATE MEDICAL CARE IF:   You cough up bloody sputum that had not been present before.  You develop fever of 102 F (38.9 C) or greater.  You develop worsening pain at or near the incision site. MAKE SURE YOU:   Understand these instructions.  Will watch your condition.  Will get help right away if you are not doing well or get worse. Document Released: 12/22/2006 Document Revised: 11/03/2011 Document Reviewed: 02/22/2007 [facility] Patient Information 2014 [place], [place].   ________________________________________________________________________

## 2014-10-11 NOTE — Progress Notes (Signed)
Surgery on 10/18/2014.  preop on 10/13/14 at 1000am.   Need orders in EPIC.  Thank You.

## 2014-10-13 ENCOUNTER — Encounter (HOSPITAL_COMMUNITY): Payer: Self-pay

## 2014-10-13 ENCOUNTER — Encounter (HOSPITAL_COMMUNITY)
Admission: RE | Admit: 2014-10-13 | Discharge: 2014-10-13 | Disposition: A | Discharge status: Home / Self Care | Payer: BLUE CROSS/BLUE SHIELD | Source: Ambulatory Visit | Attending: Specialist | Admitting: Specialist

## 2014-10-13 DIAGNOSIS — S76111A Strain of right quadriceps muscle, fascia and tendon, initial encounter: Secondary | ICD-10-CM | POA: Diagnosis not present

## 2014-10-13 DIAGNOSIS — Z01812 Encounter for preprocedural laboratory examination: Secondary | ICD-10-CM | POA: Diagnosis not present

## 2014-10-13 DIAGNOSIS — X58XXXA Exposure to other specified factors, initial encounter: Secondary | ICD-10-CM | POA: Diagnosis not present

## 2014-10-13 DIAGNOSIS — K219 Gastro-esophageal reflux disease without esophagitis: Secondary | ICD-10-CM | POA: Insufficient documentation

## 2014-10-13 HISTORY — DX: Unspecified osteoarthritis, unspecified site: M19.90

## 2014-10-13 HISTORY — DX: Other complications of anesthesia, initial encounter: T88.59XA

## 2014-10-13 HISTORY — DX: Adverse effect of unspecified anesthetic, initial encounter: T41.45XA

## 2014-10-13 LAB — BASIC METABOLIC PANEL
Anion gap: 9 (ref 5–15)
BUN: 12 mg/dL (ref 6–23)
CALCIUM: 9.2 mg/dL (ref 8.4–10.5)
CO2: 27 mmol/L (ref 19–32)
Chloride: 101 mmol/L (ref 96–112)
Creatinine, Ser: 1.12 mg/dL (ref 0.50–1.35)
GFR calc Af Amer: >90 mL/min (ref >90)
GFR, EST NON AFRICAN AMERICAN: 79 mL/min — AB (ref >90)
GLUCOSE: 150 mg/dL — AB (ref 70–99)
POTASSIUM: 4.1 mmol/L (ref 3.5–5.1)
SODIUM: 137 mmol/L (ref 135–145)

## 2014-10-13 LAB — CBC
HCT: 40.2 % (ref 39.0–52.0)
HEMOGLOBIN: 13.5 g/dL (ref 13.0–17.0)
MCH: 27.7 pg (ref 26.0–34.0)
MCHC: 33.6 g/dL (ref 30.0–36.0)
MCV: 82.5 fL (ref 78.0–100.0)
Platelets: 260 10*3/uL (ref 150–400)
RBC: 4.87 MIL/uL (ref 4.22–5.81)
RDW: 12.2 % (ref 11.5–15.5)
WBC: 5.4 10*3/uL (ref 4.0–10.5)

## 2014-10-13 LAB — SEDIMENTATION RATE: Sed Rate: 37 mm/hr — ABNORMAL HIGH (ref 0–16)

## 2014-10-13 NOTE — Progress Notes (Signed)
SED Rate done 10/13/14 faxed via EPIC to Dr Shelle IronBeane.

## 2014-10-13 NOTE — Progress Notes (Signed)
CXR- 10/22/2013 EPIC  EKG- 10/24/13 EPIC

## 2014-10-17 LAB — HLA-B27 ANTIGEN: DNA Result:: NOT DETECTED

## 2014-10-17 MED ORDER — DEXTROSE 5 % IV SOLN
3.0000 g | INTRAVENOUS | Status: AC
  Administered 2014-10-18: 3 g via INTRAVENOUS
  Filled 2014-10-17: qty 3000

## 2014-10-17 NOTE — Progress Notes (Signed)
Per Zelphia Cairo, RN, per Larene Beach in lab HLA-B27 antigen results will not be back until 10/20/2014.  These results are only run on Friday per Lab.

## 2014-10-18 ENCOUNTER — Ambulatory Visit (HOSPITAL_COMMUNITY)
Admission: RE | Admit: 2014-10-18 | Discharge: 2014-10-19 | Disposition: A | Discharge status: Home / Self Care | Payer: BLUE CROSS/BLUE SHIELD | Source: Ambulatory Visit | Attending: Specialist | Admitting: Specialist

## 2014-10-18 ENCOUNTER — Ambulatory Visit (HOSPITAL_COMMUNITY): Payer: BLUE CROSS/BLUE SHIELD | Admitting: Certified Registered Nurse Anesthetist

## 2014-10-18 ENCOUNTER — Encounter (HOSPITAL_COMMUNITY)
Admission: RE | Disposition: A | Discharge status: Home / Self Care | Payer: Self-pay | Source: Ambulatory Visit | Attending: Specialist

## 2014-10-18 ENCOUNTER — Encounter (HOSPITAL_COMMUNITY): Payer: Self-pay | Admitting: *Deleted

## 2014-10-18 DIAGNOSIS — Z6833 Body mass index (BMI) 33.0-33.9, adult: Secondary | ICD-10-CM | POA: Insufficient documentation

## 2014-10-18 DIAGNOSIS — Z87891 Personal history of nicotine dependence: Secondary | ICD-10-CM | POA: Diagnosis not present

## 2014-10-18 DIAGNOSIS — M1990 Unspecified osteoarthritis, unspecified site: Secondary | ICD-10-CM | POA: Diagnosis not present

## 2014-10-18 DIAGNOSIS — S76111A Strain of right quadriceps muscle, fascia and tendon, initial encounter: Secondary | ICD-10-CM | POA: Diagnosis not present

## 2014-10-18 DIAGNOSIS — S86919A Strain of unspecified muscle(s) and tendon(s) at lower leg level, unspecified leg, initial encounter: Secondary | ICD-10-CM | POA: Diagnosis present

## 2014-10-18 DIAGNOSIS — K219 Gastro-esophageal reflux disease without esophagitis: Secondary | ICD-10-CM | POA: Diagnosis not present

## 2014-10-18 HISTORY — PX: QUADRICEPS TENDON REPAIR: SHX756

## 2014-10-18 LAB — CREATININE, SERUM
CREATININE: 1.12 mg/dL (ref 0.50–1.35)
GFR calc Af Amer: >90 mL/min (ref >90)
GFR calc non Af Amer: 79 mL/min — ABNORMAL LOW (ref >90)

## 2014-10-18 LAB — CBC
HCT: 39.9 % (ref 39.0–52.0)
Hemoglobin: 13 g/dL (ref 13.0–17.0)
MCH: 27.1 pg (ref 26.0–34.0)
MCHC: 32.6 g/dL (ref 30.0–36.0)
MCV: 83.1 fL (ref 78.0–100.0)
PLATELETS: 245 10*3/uL (ref 150–400)
RBC: 4.8 MIL/uL (ref 4.22–5.81)
RDW: 12.3 % (ref 11.5–15.5)
WBC: 7.8 10*3/uL (ref 4.0–10.5)

## 2014-10-18 SURGERY — REPAIR, TENDON, QUADRICEPS
Anesthesia: General | Laterality: Right

## 2014-10-18 MED ORDER — EPHEDRINE SULFATE 50 MG/ML IJ SOLN
INTRAMUSCULAR | Status: AC
  Filled 2014-10-18: qty 1

## 2014-10-18 MED ORDER — DOCUSATE SODIUM 100 MG PO CAPS: 100.0000 mg | ORAL_CAPSULE | Freq: Two times a day (BID) | ORAL | Status: DC | PRN

## 2014-10-18 MED ORDER — KCL IN DEXTROSE-NACL 20-5-0.45 MEQ/L-%-% IV SOLN
INTRAVENOUS | Status: AC
  Administered 2014-10-18 – 2014-10-19 (×2): via INTRAVENOUS
  Filled 2014-10-18 (×2): qty 1000

## 2014-10-18 MED ORDER — LIDOCAINE-EPINEPHRINE (PF) 2 %-1:200000 IJ SOLN
INTRAMUSCULAR | Status: AC
  Filled 2014-10-18: qty 20

## 2014-10-18 MED ORDER — FENTANYL CITRATE 0.05 MG/ML IJ SOLN
INTRAMUSCULAR | Status: DC | PRN
  Administered 2014-10-18 (×2): 50 ug via INTRAVENOUS
  Administered 2014-10-18 (×4): 25 ug via INTRAVENOUS
  Administered 2014-10-18: 100 ug via INTRAVENOUS

## 2014-10-18 MED ORDER — METHOCARBAMOL 500 MG PO TABS
500.0000 mg | ORAL_TABLET | Freq: Four times a day (QID) | ORAL | Status: DC | PRN
  Administered 2014-10-19: 500 mg via ORAL
  Filled 2014-10-18: qty 1

## 2014-10-18 MED ORDER — METOCLOPRAMIDE HCL 5 MG/ML IJ SOLN
5.0000 mg | Freq: Three times a day (TID) | INTRAMUSCULAR | Status: DC | PRN
Start: 2014-10-18 — End: 2014-10-19

## 2014-10-18 MED ORDER — FENTANYL CITRATE 0.05 MG/ML IJ SOLN
INTRAMUSCULAR | Status: AC
Start: 2014-10-18 — End: 2014-10-18
  Filled 2014-10-18: qty 2

## 2014-10-18 MED ORDER — ENOXAPARIN SODIUM 40 MG/0.4ML ~~LOC~~ SOLN
40.0000 mg | SUBCUTANEOUS | Status: DC
Start: 2014-10-19 — End: 2014-10-19
  Administered 2014-10-19: 40 mg via SUBCUTANEOUS
  Filled 2014-10-18 (×2): qty 0.4

## 2014-10-18 MED ORDER — MIDAZOLAM HCL 2 MG/2ML IJ SOLN
0.5000 mg | Freq: Once | INTRAMUSCULAR | Status: DC | PRN
Start: 2014-10-18 — End: 2014-10-18

## 2014-10-18 MED ORDER — MEPERIDINE HCL 50 MG/ML IJ SOLN: 6.2500 mg | INTRAMUSCULAR | Status: DC | PRN

## 2014-10-18 MED ORDER — PROPOFOL 10 MG/ML IV BOLUS
INTRAVENOUS | Status: AC
  Filled 2014-10-18: qty 20

## 2014-10-18 MED ORDER — ONDANSETRON HCL 4 MG/2ML IJ SOLN: 4.0000 mg | Freq: Four times a day (QID) | INTRAMUSCULAR | Status: DC | PRN

## 2014-10-18 MED ORDER — KCL IN DEXTROSE-NACL 20-5-0.45 MEQ/L-%-% IV SOLN
INTRAVENOUS | Status: AC
  Filled 2014-10-18: qty 1000

## 2014-10-18 MED ORDER — METOCLOPRAMIDE HCL 10 MG PO TABS: 5.0000 mg | ORAL_TABLET | Freq: Three times a day (TID) | ORAL | Status: DC | PRN

## 2014-10-18 MED ORDER — HYDROMORPHONE HCL 2 MG PO TABS
2.0000 mg | ORAL_TABLET | ORAL | Status: DC | PRN
  Administered 2014-10-19 (×3): 4 mg via ORAL
  Filled 2014-10-18 (×3): qty 2

## 2014-10-18 MED ORDER — ONDANSETRON HCL 4 MG/2ML IJ SOLN
INTRAMUSCULAR | Status: AC
Start: 2014-10-18 — End: 2014-10-18
  Filled 2014-10-18: qty 2

## 2014-10-18 MED ORDER — SODIUM CHLORIDE 0.9 % IR SOLN
Status: AC
  Filled 2014-10-18: qty 1

## 2014-10-18 MED ORDER — FAMOTIDINE 10 MG PO TABS
10.0000 mg | ORAL_TABLET | Freq: Two times a day (BID) | ORAL | Status: DC
  Administered 2014-10-18: 10 mg via ORAL
  Filled 2014-10-18 (×4): qty 1

## 2014-10-18 MED ORDER — HYDROMORPHONE HCL 1 MG/ML IJ SOLN
0.2500 mg | INTRAMUSCULAR | Status: DC | PRN
  Administered 2014-10-18 (×2): 0.5 mg via INTRAVENOUS

## 2014-10-18 MED ORDER — ACETAMINOPHEN 10 MG/ML IV SOLN
1000.0000 mg | Freq: Once | INTRAVENOUS | Status: AC
  Administered 2014-10-18: 1000 mg via INTRAVENOUS
  Filled 2014-10-18: qty 100

## 2014-10-18 MED ORDER — CYCLOBENZAPRINE HCL 10 MG PO TABS
10.0000 mg | ORAL_TABLET | Freq: Three times a day (TID) | ORAL | Status: DC | PRN
  Administered 2014-10-18 – 2014-10-19 (×2): 10 mg via ORAL
  Filled 2014-10-18 (×2): qty 1

## 2014-10-18 MED ORDER — PROPOFOL 10 MG/ML IV BOLUS
INTRAVENOUS | Status: DC | PRN
  Administered 2014-10-18: 200 mg via INTRAVENOUS

## 2014-10-18 MED ORDER — DEXAMETHASONE SODIUM PHOSPHATE 10 MG/ML IJ SOLN
INTRAMUSCULAR | Status: AC
Start: 2014-10-18 — End: 2014-10-18
  Filled 2014-10-18: qty 1

## 2014-10-18 MED ORDER — DEXTROSE 5 % IV SOLN
3.0000 g | Freq: Four times a day (QID) | INTRAVENOUS | Status: AC
  Administered 2014-10-18 (×2): 3 g via INTRAVENOUS
  Filled 2014-10-18 (×2): qty 3000

## 2014-10-18 MED ORDER — HYDROMORPHONE HCL 2 MG PO TABS
2.0000 mg | ORAL_TABLET | ORAL | Status: DC | PRN
Start: 2014-10-18 — End: 2014-10-18
  Administered 2014-10-18: 4 mg via ORAL
  Filled 2014-10-18 (×2): qty 1

## 2014-10-18 MED ORDER — BUPIVACAINE-EPINEPHRINE (PF) 0.5% -1:200000 IJ SOLN
INTRAMUSCULAR | Status: AC
  Filled 2014-10-18: qty 30

## 2014-10-18 MED ORDER — LIDOCAINE-EPINEPHRINE (PF) 2 %-1:200000 IJ SOLN
INTRAMUSCULAR | Status: DC | PRN
  Administered 2014-10-18: 30 mL via PERINEURAL

## 2014-10-18 MED ORDER — RISAQUAD PO CAPS
1.0000 | ORAL_CAPSULE | Freq: Every day | ORAL | Status: DC
  Administered 2014-10-18 – 2014-10-19 (×2): 1 via ORAL
  Filled 2014-10-18 (×2): qty 1

## 2014-10-18 MED ORDER — LACTATED RINGERS IV SOLN
INTRAVENOUS | Status: DC
  Administered 2014-10-18: 1000 mL via INTRAVENOUS
  Administered 2014-10-18: 10:00:00 via INTRAVENOUS

## 2014-10-18 MED ORDER — SCOPOLAMINE 1 MG/3DAYS TD PT72
MEDICATED_PATCH | TRANSDERMAL | Status: DC | PRN
  Administered 2014-10-18: 1 via TRANSDERMAL

## 2014-10-18 MED ORDER — CYCLOBENZAPRINE HCL 10 MG PO TABS: 10.0000 mg | ORAL_TABLET | Freq: Three times a day (TID) | ORAL | Status: DC | PRN

## 2014-10-18 MED ORDER — SCOPOLAMINE 1 MG/3DAYS TD PT72
MEDICATED_PATCH | TRANSDERMAL | Status: AC
  Filled 2014-10-18: qty 1

## 2014-10-18 MED ORDER — FENTANYL CITRATE 0.05 MG/ML IJ SOLN
INTRAMUSCULAR | Status: AC
  Filled 2014-10-18: qty 5

## 2014-10-18 MED ORDER — LIDOCAINE HCL (CARDIAC) 20 MG/ML IV SOLN
INTRAVENOUS | Status: DC | PRN
  Administered 2014-10-18: 50 mg via INTRAVENOUS

## 2014-10-18 MED ORDER — FLUTICASONE PROPIONATE 50 MCG/ACT NA SUSP
2.0000 | Freq: Every day | NASAL | Status: DC
  Administered 2014-10-19: 2 via NASAL
  Filled 2014-10-18: qty 16

## 2014-10-18 MED ORDER — ONDANSETRON HCL 4 MG/2ML IJ SOLN
INTRAMUSCULAR | Status: DC | PRN
  Administered 2014-10-18: 4 mg via INTRAVENOUS

## 2014-10-18 MED ORDER — SODIUM CHLORIDE 0.9 % IJ SOLN
INTRAMUSCULAR | Status: AC
  Filled 2014-10-18: qty 10

## 2014-10-18 MED ORDER — ONDANSETRON HCL 4 MG PO TABS: 4.0000 mg | ORAL_TABLET | Freq: Four times a day (QID) | ORAL | Status: DC | PRN

## 2014-10-18 MED ORDER — MIDAZOLAM HCL 2 MG/2ML IJ SOLN
INTRAMUSCULAR | Status: AC
  Filled 2014-10-18: qty 2

## 2014-10-18 MED ORDER — MIDAZOLAM HCL 5 MG/5ML IJ SOLN
INTRAMUSCULAR | Status: DC | PRN
  Administered 2014-10-18 (×2): 1 mg via INTRAVENOUS

## 2014-10-18 MED ORDER — PROMETHAZINE HCL 25 MG/ML IJ SOLN: 6.2500 mg | INTRAMUSCULAR | Status: DC | PRN

## 2014-10-18 MED ORDER — DEXAMETHASONE SODIUM PHOSPHATE 10 MG/ML IJ SOLN
INTRAMUSCULAR | Status: DC | PRN
  Administered 2014-10-18: 10 mg via INTRAVENOUS

## 2014-10-18 MED ORDER — METHOCARBAMOL 1000 MG/10ML IJ SOLN
500.0000 mg | Freq: Four times a day (QID) | INTRAVENOUS | Status: DC | PRN
  Administered 2014-10-18: 500 mg via INTRAVENOUS
  Filled 2014-10-18 (×2): qty 5

## 2014-10-18 MED ORDER — HYDROMORPHONE HCL 4 MG PO TABS: 4.0000 mg | ORAL_TABLET | ORAL | Status: DC | PRN

## 2014-10-18 MED ORDER — HYDROMORPHONE HCL 1 MG/ML IJ SOLN
INTRAMUSCULAR | Status: AC
  Filled 2014-10-18: qty 1

## 2014-10-18 MED ORDER — HYDROMORPHONE HCL 1 MG/ML IJ SOLN
0.5000 mg | INTRAMUSCULAR | Status: DC | PRN
  Administered 2014-10-18 – 2014-10-19 (×3): 1 mg via INTRAVENOUS
  Filled 2014-10-18 (×3): qty 1

## 2014-10-18 SURGICAL SUPPLY — 53 items
BAG ZIPLOCK 12X15 (MISCELLANEOUS) IMPLANT
BANDAGE ESMARK 6X9 LF (GAUZE/BANDAGES/DRESSINGS) ×1 IMPLANT
BIT DRILL 2.4X128 (BIT) ×2 IMPLANT
BIT DRILL 2.8X128 (BIT) IMPLANT
BNDG ELASTIC 6X10 VLCR STRL LF (GAUZE/BANDAGES/DRESSINGS) ×4 IMPLANT
BNDG ESMARK 6X9 LF (GAUZE/BANDAGES/DRESSINGS) ×2
CLOTH 2% CHLOROHEXIDINE 3PK (PERSONAL CARE ITEMS) ×2 IMPLANT
CUFF TOURN SGL QUICK 34 (TOURNIQUET CUFF) ×1
CUFF TRNQT CYL 34X4X40X1 (TOURNIQUET CUFF) ×1 IMPLANT
DRAPE ORTHO SPLIT 77X108 STRL (DRAPES) ×2
DRAPE POUCH INSTRU U-SHP 10X18 (DRAPES) ×2 IMPLANT
DRAPE SHEET LG 3/4 BI-LAMINATE (DRAPES) IMPLANT
DRAPE SURG ORHT 6 SPLT 77X108 (DRAPES) ×2 IMPLANT
DRAPE U-SHAPE 47X51 STRL (DRAPES) ×2 IMPLANT
DRSG ADAPTIC 3X8 NADH LF (GAUZE/BANDAGES/DRESSINGS) IMPLANT
DRSG AQUACEL AG ADV 3.5X10 (GAUZE/BANDAGES/DRESSINGS) ×2 IMPLANT
DRSG PAD ABDOMINAL 8X10 ST (GAUZE/BANDAGES/DRESSINGS) IMPLANT
DURAPREP 26ML APPLICATOR (WOUND CARE) ×2 IMPLANT
ELECT REM PT RETURN 9FT ADLT (ELECTROSURGICAL) ×2
ELECTRODE REM PT RTRN 9FT ADLT (ELECTROSURGICAL) ×1 IMPLANT
GAUZE SPONGE 4X4 12PLY STRL (GAUZE/BANDAGES/DRESSINGS) IMPLANT
GLOVE BIOGEL PI IND STRL 7.5 (GLOVE) ×1 IMPLANT
GLOVE BIOGEL PI IND STRL 8 (GLOVE) ×1 IMPLANT
GLOVE BIOGEL PI INDICATOR 7.5 (GLOVE) ×1
GLOVE BIOGEL PI INDICATOR 8 (GLOVE) ×1
GLOVE SURG SS PI 7.5 STRL IVOR (GLOVE) ×2 IMPLANT
GLOVE SURG SS PI 8.0 STRL IVOR (GLOVE) ×2 IMPLANT
GOWN STRL REUS W/TWL XL LVL3 (GOWN DISPOSABLE) ×4 IMPLANT
IMMOBILIZER KNEE 20 (SOFTGOODS) ×2
IMMOBILIZER KNEE 20 THIGH 36 (SOFTGOODS) ×1 IMPLANT
KIT BASIN OR (CUSTOM PROCEDURE TRAY) ×2 IMPLANT
MANIFOLD NEPTUNE II (INSTRUMENTS) ×2 IMPLANT
NEEDLE HYPO 22GX1.5 SAFETY (NEEDLE) ×2 IMPLANT
NEEDLE MA TROC 1/2 CIR (NEEDLE) ×2 IMPLANT
NEEDLE MAYO .5 CIRCLE (NEEDLE) IMPLANT
PACK TOTAL JOINT (CUSTOM PROCEDURE TRAY) ×2 IMPLANT
PADDING CAST COTTON 6X4 STRL (CAST SUPPLIES) IMPLANT
PASSER SUT SWANSON 36MM LOOP (INSTRUMENTS) IMPLANT
POSITIONER SURGICAL ARM (MISCELLANEOUS) ×2 IMPLANT
STAPLER VISISTAT (STAPLE) ×2 IMPLANT
SUT ETHIBOND 5 LR DA (SUTURE) IMPLANT
SUT ETHIBOND NAB CT1 #1 30IN (SUTURE) ×4 IMPLANT
SUT FIBERWIRE #2 38 T-5 BLUE (SUTURE) ×4
SUT VIC AB 0 CT1 27 (SUTURE)
SUT VIC AB 0 CT1 27XBRD ANTBC (SUTURE) IMPLANT
SUT VIC AB 1 CT1 27 (SUTURE) ×3
SUT VIC AB 1 CT1 27XBRD ANTBC (SUTURE) ×3 IMPLANT
SUT VIC AB 2-0 CT1 27 (SUTURE) ×2
SUT VIC AB 2-0 CT1 TAPERPNT 27 (SUTURE) ×2 IMPLANT
SUTURE FIBERWR #2 38 T-5 BLUE (SUTURE) ×2 IMPLANT
SYRINGE 20CC LL (MISCELLANEOUS) IMPLANT
TOWEL OR 17X26 10 PK STRL BLUE (TOWEL DISPOSABLE) ×2 IMPLANT
TOWEL OR NON WOVEN STRL DISP B (DISPOSABLE) ×2 IMPLANT

## 2014-10-18 NOTE — Discharge Instructions (Signed)
Keep TROM brace in place right knee at all times, locked in full extension (knee straight), do not remove brace May weight bear as tolerated on heel in brace Aquacel dressing may remain in place until follow up Ice and elevate R knee 5-6x/day, 20-30 minutes each, toes above the nose Take Aspirin 325mg  twice daily to prevent blood clots May put a bag around leg to shower to keep brace dry Follow up in 2 weeks for staple removal

## 2014-10-18 NOTE — Plan of Care (Signed)
Problem: Consults Goal: General Surgical Patient Education (See Patient Education module for education specifics)  Outcome: Completed/Met Date Met:  10/18/14 [facility]

## 2014-10-18 NOTE — Brief Op Note (Signed)
10/18/2014  11:25 AM  PATIENT:  Brendan Macdonald  44 y.o. male  PRE-OPERATIVE DIAGNOSIS:  QUADRICEP TENDON TEAR RIGHT   POST-OPERATIVE DIAGNOSIS:  QUADRICEP TENDON TEAR RIGHT   PROCEDURE:  Procedure(s): RIGHT QUADRICEP TENDON REPAIR  (Right)  SURGEON:  Surgeon(s) and Role:    * Javier DockerJeffrey C Humaira Sculley, MD - Primary  PHYSICIAN ASSISTANT:   ASSISTANTS: [place]   ANESTHESIA:   general  EBL:     BLOOD ADMINISTERED:none  DRAINS: none   LOCAL MEDICATIONS USED:  MARCAINE     SPECIMEN:  No Specimen  DISPOSITION OF SPECIMEN:  N/A  COUNTS:  YES  TOURNIQUET:  * Missing tourniquet times found for documented tourniquets in log:  [id number] *  DICTATION: .Other Dictation: Dictation Number [id number]) [id number]  PLAN OF CARE: Admit for overnight observation  PATIENT DISPOSITION:  PACU - hemodynamically stable.   Delay start of Pharmacological VTE agent (>24hrs) due to surgical blood loss or risk of bleeding: no

## 2014-10-18 NOTE — H&P (View-Only) (Signed)
Brendan Macdonald is an 43 y.o. male.   Chief Complaint: Right knee pain HPI: [name] who presents with knee complaints. The patient is seen today for a surgical consult and in referral from Dr. Barnes. The patient reports right knee symptoms which began 9 day(s) ago following a specific injury. The injury occurred 09/27/2014 due to a direct impact while the patient was engaged in recreational activities. Prior to being seen today the patient was previously evaluated by by Dr. Barnes. Previous work-up for this problem has included knee x-rays and knee MRI. Past treatment for this problem has included crutches (non-weight bearing) and opioid analgesics. Symptoms are reported to be located in the right knee and include knee pain, swelling and instability. Current treatment includes crutches (partial weight bearing). Note for "Knee pain": The patient is currently out of work.  Past Medical History  Diagnosis Date  . GERD (gastroesophageal reflux disease)     Past Surgical History  Procedure Laterality Date  . Orthopedic surgery    . Back surgery    . Arthroscopy  Bilateral     Knees  . Rotator cuff repair Right   . Cholecystectomy N/A 02/24/2013    Procedure: LAPAROSCOPIC CHOLECYSTECTOMY;  Surgeon: Brendan A Jenkins, MD;  Location: [facility];  Service: General;  Laterality: N/A;    Family History  Problem Relation Age of Onset  . Cancer Mother   . Diabetes Mother   . Heart disease Father   . Hypertension Father    Social History:  reports that he quit smoking about 4 years ago. He does not have any smokeless tobacco history on file. He reports that he drinks alcohol. He reports that he does not use illicit drugs.  Allergies:  Allergies  Allergen Reactions  . Oxycodone Shortness Of Breath and Other (See Comments)    Hallucinations  . Vicodin [Hydrocodone-Acetaminophen] Shortness Of Breath and Other (See Comments)    Hallucinations     (Not in a hospital  admission)  No results found for this or any previous visit (from the past 48 hour(s)). No results found.  Review of Systems  Constitutional: Negative.   HENT: Negative.   Eyes: Negative.   Respiratory: Negative.   Cardiovascular: Negative.   Gastrointestinal: Negative.   Genitourinary: Negative.   Musculoskeletal: Positive for joint pain.  Skin: Negative.   Neurological: Negative.   Psychiatric/Behavioral: Negative.     There were no vitals taken for this visit. Physical Exam  Constitutional: He is oriented to person, place, and time. He appears well-developed and well-nourished.  HENT:  Head: Normocephalic and atraumatic.  Eyes: Conjunctivae are normal. Pupils are equal, round, and reactive to light.  Neck: Normal range of motion. Neck supple.  Cardiovascular: Normal rate and regular rhythm.   Respiratory: Effort normal and breath sounds normal.  GI: Soft. Bowel sounds are normal.  Musculoskeletal:  On exam, he has an extensor lag of 30 degrees. He has a defect in the quadriceps tendon. Trace knee effusion. Tender in the medial joint line. Patellofemoral pain with compression. Ipsilateral hip and ankle exams are unremarkable. No evidence of DVT.  Neurological: He is alert and oriented to person, place, and time. He has normal reflexes.  Skin: Skin is warm and dry.    The x-rays demonstrate osteophyte of the superior pole of the patella and degenerative changes consistent with old Osgood-Schlatter's disease.  MRI demonstrates quad tendon tear with 12 mm. of retraction, severe patellar tendinosis, chronic Osgood-Schlatter's   disease and moderate joint effusion.  Assessment/Plan 1. Quad tendon tear, full thickness, retracted. 2. Possible enthesopathy. 3. Patient reports recent mechanism of injury. He also had it back in the summer at work. He had more troubles with it following that. There may be a progression of that; however, at this point he is full thickness  and retracted.  Plans Transcription(Brendan Windy Kalata, MD; 10/09/2014 7:31 AM) We will proceed accordingly. Knee immobilizer. Preferred a T-ROM. We discussed the post op. course in detail as well as repair, risks and benefits of bleeding, infection, suboptimal ROM, time out of work, DVT, PE, anesthetic complications, etc. I will also obtain a sed rate, CMC, HLA-B27. He's had a quad rupture on the left side as well, interestingly. I appreciate the kind referral by Dr. Drema Macdonald and his timely diagnosis of this quadricep tendon tear.  Plan Right quadricep tendon repair  Brendan Macdonald M. PA-C for Dr. Tonita Cong 10/11/2014, 4:50 PM

## 2014-10-18 NOTE — Transfer of Care (Signed)
Immediate Anesthesia Transfer of Care Note  Patient: Brendan HerterRobert W Macdonald  Procedure(s) Performed: Procedure(s) (LRB): RIGHT QUADRICEP TENDON REPAIR  (Right)  Patient Location: PACU  Anesthesia Type: General  Level of Consciousness: sedated, patient cooperative and responds to stimulation  Airway & Oxygen Therapy: Patient Spontanous Breathing and Patient connected to face mask oxgen  Post-op Assessment: Report given to PACU RN and Post -op Vital signs reviewed and stable  Post vital signs: Reviewed and stable  Complications: No apparent anesthesia complications

## 2014-10-18 NOTE — Interval H&P Note (Signed)
History and Physical Interval Note:  10/18/2014 7:33 AM  Brendan Macdonald  has presented today for surgery, with the diagnosis of QUADRICEP TENDON TEAR RIGHT   The various methods of treatment have been discussed with the patient and family. After consideration of risks, benefits and other options for treatment, the patient has consented to  Procedure(s): RIGHT QUADRICEP TENDON REPAIR  (Right) as a surgical intervention .  The patient's history has been reviewed, patient examined, no change in status, stable for surgery.  I have reviewed the patient's chart and labs.  Questions were answered to the patient's satisfaction.     [name]

## 2014-10-18 NOTE — Anesthesia Preprocedure Evaluation (Addendum)
Anesthesia Evaluation  Patient identified by MRN, date of birth, ID band Patient awake    Reviewed: Allergy & Precautions, NPO status , Patient's Chart, lab work & pertinent test results  History of Anesthesia Complications (+) PONV and history of anesthetic complications  Airway Mallampati: I  TM Distance: >3 FB Neck ROM: Full    Dental  (+) Dental Advisory Given   Pulmonary former smoker (quit 2011),          Cardiovascular negative cardio ROS  Rhythm:Regular Rate:Normal     Neuro/Psych negative neurological ROS     GI/Hepatic Neg liver ROS, GERD-  Medicated and Controlled,  Endo/Other  Morbid obesity  Renal/GU negative Renal ROS     Musculoskeletal  (+) Arthritis -,   Abdominal (+) + obese,   Peds  Hematology   Anesthesia Other Findings   Reproductive/Obstetrics                            Anesthesia Physical Anesthesia Plan  ASA: II  Anesthesia Plan: General   Post-op Pain Management:    Induction: Intravenous  Airway Management Planned: LMA  Additional Equipment:   Intra-op Plan:   Post-operative Plan:   Informed Consent: I have reviewed the patients History and Physical, chart, labs and discussed the procedure including the risks, benefits and alternatives for the proposed anesthesia with the patient or authorized representative who has indicated his/her understanding and acceptance.   Dental advisory given  Plan Discussed with: CRNA and Surgeon  Anesthesia Plan Comments: (Plan routine monitors, GA- LMA OK Pt requests nerve block, Dr. Shelle IronBeane agrees to 6-8 hour femoral nerve block for post op analgesia)        Anesthesia Quick Evaluation

## 2014-10-18 NOTE — Anesthesia Procedure Notes (Addendum)
Anesthesia Regional Block:  Femoral nerve block  Pre-Anesthetic Checklist: ,, timeout performed, Correct Patient, Correct Site, Correct Laterality, Correct Procedure, Correct Position, site marked, Risks and benefits discussed,  Surgical consent,  Pre-op evaluation,  At surgeon's request and post-op pain management  Laterality: Right and Lower  Prep: chloraprep       Needles:  Injection technique: Single-shot  Needle Type: Stimulator Needle - 80     Needle Length: 9cm 9 cm Needle Gauge: 22 and 22 G    Additional Needles:  Procedures: nerve stimulator Femoral nerve block  Nerve Stimulator or Paresthesia:  Response: quad twitch, 0.4 mA, 0.1 ms,   Additional Responses:   Narrative:  Start time: 10/18/2014 10:01 AM End time: 10/18/2014 10:07 AM Injection made incrementally with aspirations every 5 mL.  Performed by: Personally  Anesthesiologist: Erling CruzJACKSON, E. CARSWELL  Additional Notes: Pt identified in Holding room.  Monitors applied. Working IV access confirmed. Sterile prep R groin  #22ga PNS to quad twitch at 0.6240mA threshold.  30cc 2% lidocaine with 1:200k epi injected incrementally after negative test dose.  Patient asymptomatic, VSS, no heme aspirated, tolerated well.  Sandford Craze Jackson, MD   Procedure Name: LMA Insertion Date/Time: 10/18/2014 10:22 AM Performed by: Vista LawmanEARGLE, Naraly Fritcher E Pre-anesthesia Checklist: Patient identified, Emergency Drugs available, Suction available and Patient being monitored Patient Re-evaluated:Patient Re-evaluated prior to inductionOxygen Delivery Method: Circle system utilized Preoxygenation: Pre-oxygenation with 100% oxygen Intubation Type: IV induction Ventilation: Mask ventilation without difficulty LMA: LMA inserted LMA Size: 5.0 Number of attempts: 1 Placement Confirmation: positive ETCO2 and breath sounds checked- equal and bilateral Tube secured with: Tape Dental Injury: Teeth and Oropharynx as per pre-operative assessment

## 2014-10-18 NOTE — Anesthesia Postprocedure Evaluation (Signed)
  Anesthesia Post-op Note  Patient: Brendan HerterRobert W Macdonald  Procedure(s) Performed: Procedure(s): RIGHT QUADRICEP TENDON REPAIR  (Right)  Patient Location: PACU  Anesthesia Type:GA combined with regional for post-op pain  Level of Consciousness: awake, alert , oriented and patient cooperative  Airway and Oxygen Therapy: Patient Spontanous Breathing  Post-op Pain: mild  Post-op Assessment: Post-op Vital signs reviewed, Patient's Cardiovascular Status Stable, Respiratory Function Stable, Patent Airway, No signs of Nausea or vomiting and Pain level controlled  Post-op Vital Signs: Reviewed and stable  Last Vitals:  Filed Vitals:   10/18/14 1306  BP: 122/82  Pulse: 82  Temp: 36.3 C  Resp: 10    Complications: No apparent anesthesia complications

## 2014-10-19 ENCOUNTER — Encounter (HOSPITAL_COMMUNITY): Payer: Self-pay | Admitting: Specialist

## 2014-10-19 DIAGNOSIS — S76111A Strain of right quadriceps muscle, fascia and tendon, initial encounter: Secondary | ICD-10-CM | POA: Diagnosis not present

## 2014-10-19 NOTE — Op Note (Signed)
NAMELUPE, BONNER NO.:  [id number]  MEDICAL RECORD NO.:  [id number]  LOCATION:  1619                         FACILITY:  [facility]  PHYSICIAN:  Jene Every, M.D.    DATE OF BIRTH:  Aug 08, 1971  DATE OF PROCEDURE:  10/18/2014 DATE OF DISCHARGE:                              OPERATIVE REPORT   PREOPERATIVE DIAGNOSIS:  Quadriceps tendon rupture complete with retraction, right knee, chronic.  POSTOPERATIVE DIAGNOSIS:  Quadriceps tendon rupture complete with retraction, right knee, chronic.  PROCEDURE PERFORMED: 1. Open quadriceps tendon repair, right knee. 2. Lavage and evacuation of hematoma, right knee.  ANESTHESIA:  General.  ASSISTANT:  Lanna Poche, PA.  HISTORY:  This is a 44 year old male who has ruptured his quadriceps tendon on the right.  He was indicated for repair of the ruptured tendon.  Risks and benefits discussed including bleeding, infection, damage to the neurovascular structures, suboptimal range of motion, DVT, PE, anesthetic complications, etc.  TECHNIQUE:  With the patient in supine position, after induction of adequate general anesthesia, 2 g Kefzol, the right lower extremity was prepped and draped and exsanguinated in usual sterile fashion.  Thigh tourniquet inflated to 250 mmHg.  The knee was slightly flexed.  Midline incision was made from below the patella to 4 cm above the superior pole of the patella.  Subcutaneous tissue was dissected.  Electrocautery was utilized to achieve hemostasis.  Identified the patella, quadriceps tendon, patellar ligament.  Self-retaining retractors were placed.  Full- thickness quadriceps tendon tear centrally was noted with some retraction about 3 cm.  We debrided the edges of the tendons, they were on the distal quadriceps tendon.  We then debrided through the superior pole of the patella, fashioned the trough in the superior pole of the patella with a Beyer rongeur to good bleeding bone.  We  then drilled 3 parallel tunnels on the patella cephalad and caudad with a 2.4-mm drill bit.  On the inferior surface of the patella, made small incisions parallel in the patellar ligament.  To identify the inferior portion of the tunnel.  We then used 2 FiberWire sutures and threaded them from caudad to cephalad up through the quadriceps tendon in a Krackow type stitch centrally and then to the medial and lateral aspect of the quadriceps tendon out through the center portion of the torn portion of the tendon.  I then took 2 of those suture ends and threaded into the central drill hole of the patella and a single suture end through the medial and lateral drill hole of the patella, pulled them down through the patella advancing the quadriceps into the cancellous bone.  This then held fixed in approximation with a towel clip and with pulling through on the suture ends.  The central suture ends were then threaded into the patellar ligament to the inferior portion of the medial and lateral tunnels.  With the knee in extension, tied a good surgical knot medially and laterally fixing the suture ends and fixing the quadriceps tendon into the cancellous bone.  Then, oversewed with #1 Vicryl interrupted figure-of-eight sutures, quadriceps tendon to the soft tissue remaining a small edge of soft tissue remained on the superior pole of  the patella and we repaired the retinaculum with #1 Vicryl medially and laterally.  Prior to the repair, we flexed the knee, inspected the joint.  Copiously irrigated the joint, evacuated hematoma, some chondromalacia patellofemoral joint noted.  We debrided the joint. Just prior to the repair.  Following this, we copiously irrigated the wound, flexed the knee to 60 degrees with no diastasis of the repair site.  Copiously irrigated the wound.  I then repaired the subcu with multiple 2-0 and the skin with staples.  Sterile dressing applied. Tourniquet was deflated.   There was adequate revascularization of the lower extremity appreciated.  The patient tolerated the procedure well. No complications.  The patient had a general and a femoral nerve block. Assistant was needed to hold the quadriceps tendon.  I palpated the drill in exposure and holding of the leg.  Tourniquet time was 55 minutes.     Jene EveryJeffrey Jaevin Medearis, M.D.     Cordelia PenJB/MEDQ  D:  10/18/2014  T:  10/19/2014  Job:  [id number]

## 2014-10-19 NOTE — Discharge Summary (Signed)
Patient ID: Brendan Macdonald MRN: [id number] DOB/AGE: 43-22-1972 43 y.o.  Admit date: 10/18/2014 Discharge date: 10/19/2014  Admission Diagnoses:  Principal Problem:   Rupture of right quadriceps tendon Active Problems:   Tear of tendon of lower extremity   Discharge Diagnoses:  Same  Past Medical History  Diagnosis Date  . GERD (gastroesophageal reflux disease)   . Complication of anesthesia     years ago used to hyperventilate no recent problems   . Arthritis     Surgeries: Procedure(s): RIGHT QUADRICEP TENDON REPAIR  on 10/18/2014   Consultants:    Discharged Condition: Improved  Hospital Course: Brendan Macdonald is an 44 y.o. male who was admitted 10/18/2014 for operative treatment ofRupture of right quadriceps tendon. Patient has severe unremitting pain that affects sleep, daily activities, and work/hobbies. After pre-op clearance the patient was taken to the operating room on 10/18/2014 and underwent  Procedure(s): RIGHT QUADRICEP TENDON REPAIR .    Patient was given perioperative antibiotics: Anti-infectives    Start     Dose/Rate Route Frequency Ordered Stop   10/18/14 1600  ceFAZolin (ANCEF) 3 g in dextrose 5 % 50 mL IVPB     3 g 160 mL/hr over 30 Minutes Intravenous Every 6 hours 10/18/14 1312 10/18/14 2200   10/18/14 0600  ceFAZolin (ANCEF) 3 g in dextrose 5 % 50 mL IVPB     3 g 160 mL/hr over 30 Minutes Intravenous On call to O.R. 10/17/14 1409 10/18/14 1018       Patient was given sequential compression devices, early ambulation, and chemoprophylaxis to prevent DVT.  Patient benefited maximally from hospital stay and there were no complications.    Recent vital signs: Patient Vitals for the past 24 hrs:  BP Temp Temp src Pulse Resp SpO2  10/19/14 0958 132/79 mmHg 98 F (36.7 C) Oral 73 17 98 %  10/19/14 0541 118/70 mmHg 97.8 F (36.6 C) Oral 77 18 95 %  10/19/14 0207 112/76 mmHg 97.8 F (36.6 C) Oral 83 18 98 %  10/18/14 2206 114/74 mmHg 97.6 F (36.4  C) Oral 75 16 97 %  10/18/14 1715 119/70 mmHg 97.5 F (36.4 C) Oral 82 16 98 %  10/18/14 1616 118/65 mmHg 97.3 F (36.3 C) Oral 82 16 98 %  10/18/14 1515 114/72 mmHg 98.3 F (36.8 C) Oral 88 16 98 %  10/18/14 1413 111/73 mmHg 97.6 F (36.4 C) Oral 88 16 98 %  10/18/14 1306 122/82 mmHg 97.4 F (36.3 C) - 82 10 96 %  10/18/14 1245 (!) 146/87 mmHg - - 83 (!) 8 98 %     Recent laboratory studies:  Recent Labs  10/18/14 1506  WBC 7.8  HGB 13.0  HCT 39.9  PLT 245  CREATININE 1.12     Discharge Medications:     Medication List    TAKE these medications        cyclobenzaprine 10 MG tablet  Commonly known as:  FLEXERIL  Take 1 tablet (10 mg total) by mouth 3 (three) times daily as needed for muscle spasms.     docusate sodium 100 MG capsule  Commonly known as:  COLACE  Take 1 capsule (100 mg total) by mouth 2 (two) times daily as needed for mild constipation.     fexofenadine-pseudoephedrine 180-240 MG per 24 hr tablet  Commonly known as:  ALLEGRA-D 24  Take 1 tablet by mouth daily.     fluticasone 50 MCG/ACT nasal spray  Commonly known as:  FLONASE  Place 2 sprays into both nostrils daily.     HYDROmorphone 4 MG tablet  Commonly known as:  DILAUDID  Take 1 tablet (4 mg total) by mouth every 4 (four) hours as needed for severe pain.     meloxicam 15 MG tablet  Commonly known as:  MOBIC  Take 1 tablet (15 mg total) by mouth daily.     ranitidine 150 MG tablet  Commonly known as:  ZANTAC  Take 150 mg by mouth 2 (two) times daily.        Diagnostic Studies: Dg Eye Foreign Body  09/30/2014   CLINICAL DATA:  Metal working/exposure; clearance prior to MRI  EXAM: ORBITS FOR FOREIGN BODY - 2 VIEW  COMPARISON:  None.  FINDINGS: There is no evidence of metallic foreign body within the orbits. No significant bone abnormality identified.  IMPRESSION: No evidence of metallic foreign body within the orbits.   Electronically Signed   By: Charline BillsSriyesh  Krishnan M.D.   On: 09/30/2014  11:38   Dg Knee Complete 4 Views Right  09/27/2014   CLINICAL DATA:  Right knee pain after injury earlier the same day.  EXAM: RIGHT KNEE - COMPLETE 4+ VIEW  COMPARISON:  None.  FINDINGS: No fracture or dislocation. The alignment and joint spaces are maintained. Mild spurring of the tibial spines and trace peripheral osteophytes. There is a 1.9 x1.2 cm osteochondroma arising from the distal medial femoral condyle. Small quadriceps tendon enthesophyte. There is chronic fragmentation at the tibial insertion of the patellar tendon. Moderate joint effusion. There is anterior soft tissue edema.  IMPRESSION: 1. Joint effusion.  No acute fracture or dislocation. 2. Mild osteoarthritis. 3. Osteochondroma arising from the medial femoral condyle. No further follow-up is needed unless there was focal pain in this region prior to injury.   Electronically Signed   By: Rubye OaksMelanie  Ehinger M.D.   On: 09/27/2014 21:15    Disposition: 01-Home or Self Care      Discharge Instructions    Call MD / Call 911    Complete by:  As directed   If you experience chest pain or shortness of breath, CALL 911 and be transported to the hospital emergency room.  If you develope a fever above 101 F, pus (white drainage) or increased drainage or redness at the wound, or calf pain, call your surgeon's office.     Constipation Prevention    Complete by:  As directed   Drink plenty of fluids.  Prune juice may be helpful.  You may use a stool softener, such as Colace (over the counter) 100 mg twice a day.  Use MiraLax (over the counter) for constipation as needed.     Diet - low sodium heart healthy    Complete by:  As directed      Increase activity slowly as tolerated    Complete by:  As directed            Follow-up Information    Follow up with BEANE,JEFFREY C, MD In 2 weeks.   Specialty:  Orthopedic Surgery   Why:  For suture removal   Contact information:   [address] [place]  [id number] [phone]       Follow up with Javier DockerBEANE,JEFFREY C, MD In 2 weeks.   Specialty:  Orthopedic Surgery   Contact information:   [address] [place] [id number] [phone]        Signed: Dorothy SparkBISSELL, JACLYN M. 10/19/2014, 12:37 PM

## 2014-10-19 NOTE — Evaluation (Signed)
Physical Therapy Evaluation Patient Details Name: Brendan Macdonald MRN: [id number] DOB: Mar 02, 1971 Today's Date: 10/19/2014   History of Present Illness  Repair of R patella tendon rupture  Clinical Impression  Patient mobilizing well with RW and crutches. No further PT needs.    Follow Up Recommendations No PT follow up    Equipment Recommendations  None recommended by PT    Recommendations for Other Services       Precautions / Restrictions Precautions Precautions: Fall Required Braces or Orthoses: Other Brace/Splint Other Brace/Splint: Bledsoe locked at 0 Restrictions Other Position/Activity Restrictions: heel weight bear      Mobility  Bed Mobility Overal bed mobility: Needs Assistance Bed Mobility: Supine to Sit;Sit to Supine     Supine to sit: Min guard Sit to supine: Min guard   General bed mobility comments: uses L leg to hook and scoop R leg on/off bed  Transfers Overall transfer level: Needs assistance Equipment used: Rolling walker (2 wheeled) Transfers: Sit to/from Stand Sit to Stand: Min guard;From elevated surface            Ambulation/Gait Ambulation/Gait assistance: Min guard   Assistive device: Crutches;Rolling walker (2 wheeled) Gait Pattern/deviations: Step-to pattern;Decreased stance time - right Gait velocity: slow   General Gait Details: patient  ambulated x 50' with RW and 50'' with crutches with swing thriough  Stairs Stairs:  (pt declined the need to practice since has been on crutches x 10 days,)          Wheelchair Mobility    Modified Rankin (Stroke Patients Only)       Balance                                             Pertinent Vitals/Pain Pain Assessment: 0-10 Pain Score: 8  Pain Descriptors / Indicators: Discomfort;Shooting;Sharp;Spasm Pain Intervention(s): Monitored during session;Ice applied;Premedicated before session;Repositioned;Patient requesting pain meds-RN notified    Home  Living Family/patient expects to be discharged to:: Private residence Living Arrangements: Spouse/significant other Available Help at Discharge: Family Type of Home: House Home Access: Stairs to enter Entrance Stairs-Rails: Right;Left;Can reach both Secretary/administrator of Steps: 3 Home Layout: One level Home Equipment: Crutches;Walker - 2 wheels      Prior Function Level of Independence: Independent with assistive device(s)               Hand Dominance        Extremity/Trunk Assessment   Upper Extremity Assessment: Overall WFL for tasks assessed           Lower Extremity Assessment: RLE deficits/detail RLE Deficits / Details: in braace at 0       Communication   Communication: No difficulties  Cognition Arousal/Alertness: Awake/alert Behavior During Therapy: WFL for tasks assessed/performed Overall Cognitive Status: Within Functional Limits for tasks assessed                      General Comments      Exercises        Assessment/Plan    PT Assessment Patent does not need any further PT services  PT Diagnosis Acute pain   PT Problem List    PT Treatment Interventions     PT Goals (Current goals can be found in the Care Plan section) Acute Rehab PT Goals PT Goal Formulation: All assessment and education complete, DC therapy  Frequency     Barriers to discharge        Co-evaluation               End of Session Equipment Utilized During Treatment: Gait belt Activity Tolerance: Patient tolerated treatment well;Patient limited by pain Patient left: in bed;with call bell/phone within reach Nurse Communication: Mobility status;Patient requests pain meds    Functional Assessment Tool Used: clinical judgement Functional Limitation: Mobility: Walking and moving around Mobility: Walking and Moving Around Current Status [phone](G8978): At least 1 percent but less than 20 percent impaired, limited or restricted Mobility: Walking and  Moving Around Goal Status [phone](G8979): At least 1 percent but less than 20 percent impaired, limited or restricted Mobility: Walking and Moving Around Discharge Status [phone](G8980): At least 1 percent but less than 20 percent impaired, limited or restricted    Time: 1131-1205 PT Time Calculation (min) (ACUTE ONLY): 34 min   Charges:   PT Evaluation $Initial PT Evaluation Tier I: 1 Procedure PT Treatments $Gait Training: 8-22 mins   PT G Codes:   PT G-Codes **NOT FOR INPATIENT CLASS** Functional Assessment Tool Used: clinical judgement Functional Limitation: Mobility: Walking and moving around Mobility: Walking and Moving Around Current Status ([phone]): At least 1 percent but less than 20 percent impaired, limited or restricted Mobility: Walking and Moving Around Goal Status [phone]([phone]): At least 1 percent but less than 20 percent impaired, limited or restricted Mobility: Walking and Moving Around Discharge Status [phone]([phone]): At least 1 percent but less than 20 percent impaired, limited or restricted    Rada HayHill, Christin Moline Elizabeth 10/19/2014, 1:31 PM

## 2014-10-19 NOTE — Progress Notes (Signed)
Subjective: 1 Day Post-Op Procedure(s) (LRB): RIGHT QUADRICEP TENDON REPAIR  (Right) Patient reports pain as 3 on 0-10 scale.    Objective: Vital signs in last 24 hours: Temp:  [97.3 F (36.3 C)-98.3 F (36.8 C)] 97.8 F (36.6 C) (02/25 0541) Pulse Rate:  [75-90] 77 (02/25 0541) Resp:  [8-18] 18 (02/25 0541) BP: (111-151)/(65-89) 118/70 mmHg (02/25 0541) SpO2:  [95 %-100 %] 95 % (02/25 0541) FiO2 (%):  [2 %] 2 % (02/24 1413)  Intake/Output from previous day: 02/24 0701 - 02/25 0700 In: 1835 [P.O.:360; I.V.:1425; IV Piggyback:50] Out: 2375 [Urine:2325; Blood:50] Intake/Output this shift:     Recent Labs  10/18/14 1506  HGB 13.0    Recent Labs  10/18/14 1506  WBC 7.8  RBC 4.80  HCT 39.9  PLT 245    Recent Labs  10/18/14 1506  CREATININE 1.12   No results for input(s): LABPT, INR in the last 72 hours.  Neurologically intact Intact pulses distally Dorsiflexion/Plantar flexion intact Incision: dressing C/D/I and no drainage  Assessment/Plan: 1 Day Post-Op Procedure(s) (LRB): RIGHT QUADRICEP TENDON REPAIR  (Right) Advance diet Up with therapy D/C IV fluids Discharge home with home health  D/C instr given ASA for DVT proph. WB on heel. TROM locked at 0.  Gracia Saggese C 10/19/2014, 9:55 AM

## 2014-11-10 ENCOUNTER — Ambulatory Visit: Payer: BLUE CROSS/BLUE SHIELD | Attending: Specialist | Admitting: Physical Therapy

## 2014-11-10 DIAGNOSIS — M25561 Pain in right knee: Secondary | ICD-10-CM

## 2014-11-10 DIAGNOSIS — M25661 Stiffness of right knee, not elsewhere classified: Secondary | ICD-10-CM | POA: Diagnosis not present

## 2014-11-10 NOTE — Therapy (Signed)
[facility] [address] [place], [place], [address] Phone: [phone]   Fax:  [phone]  Physical Therapy Evaluation  Patient Details  Name: Brendan Macdonald MRN: [id number] Date of Birth: 10/26/1970 Referring Provider:  Jene Every, MD  Encounter Date: 11/10/2014      PT End of Session - 11/10/14 1247    Visit Number 1   Number of Visits 12   Date for PT Re-Evaluation 12/22/14   PT Start Time 0947   PT Stop Time 1033   PT Time Calculation (min) 46 min      Past Medical History  Diagnosis Date  . GERD (gastroesophageal reflux disease)   . Complication of anesthesia     years ago used to hyperventilate no recent problems   . Arthritis     Past Surgical History  Procedure Laterality Date  . Orthopedic surgery      left knee quad tendon tear and patella tear   . Back surgery    . Arthroscopy  Bilateral     Knees  . Rotator cuff repair Right   . Cholecystectomy N/A 02/24/2013    Procedure: LAPAROSCOPIC CHOLECYSTECTOMY;  Surgeon: Dalia Heading, MD;  Location: [facility];  Service: General;  Laterality: N/A;  . Quadriceps tendon repair Right 10/18/2014    Procedure: RIGHT QUADRICEP TENDON REPAIR ;  Surgeon: Javier Docker, MD;  Location: [facility];  Service: Orthopedics;  Laterality: Right;    There were no vitals filed for this visit.  Visit Diagnosis:  Right knee pain - Plan: PT plan of care cert/re-cert  Knee stiffness, right - Plan: PT plan of care cert/re-cert      Subjective Assessment - 11/10/14 1219    Patient Stated Goals Return to work.   Currently in Pain? Yes   Pain Score 6    Pain Location Knee   Pain Orientation Right   Pain Descriptors / Indicators Aching   Pain Type Surgical pain   Pain Onset More than a month ago   Aggravating Factors  Being up a lot.   Effect of Pain on Daily Activities Unable to work.   Multiple Pain Sites No            [facility] PT Assessment - 11/10/14 0001    Assessment   Medical Diagnosis S/p Quadriceps tendon repair   Onset Date 10/18/14   Next MD Visit --  11/29/14.   Precautions   Precautions Other (comment)   Precaution Comments 0-30 degrees passive motion right knee for first 2 weeks, then advance to 0-60 degrees passive motion x 2 weeks, then advance to 0-90 degrees.  May weightbear as tolerated on heel in TROM.  Keep TROM on for all weightbearing .  No strengthening or active motion until next follow up with Dr. Shelle Iron in 4 weeks.   Restrictions   Weight Bearing Restrictions Yes   Other Position/Activity Restrictions WBAT with right TROM donned and locked in full extension.   Balance Screen   Has the patient fallen in the past 6 months No   Has the patient had a decrease in activity level because of a fear of falling?  No   Is the patient reluctant to leave their home because of a fear of falling?  No   ROM / Strength   AROM / PROM / Strength PROM   PROM   PROM Assessment Site Knee  0 to 30 degrees (per surgeon guidlines).   Right/Left Knee --   Palpation  Palpation Diffuse anterior right knee pain.   Special Tests    Special Tests --  Circum measurement mid-pat right 1 cm > left this morning.   Ambulation/Gait   Ambulation/Gait --  Ind ambulation with right knee brace locked at 0 degrees.                   [facility] PT Treatment/Exercise - 11/10/14 0001    Exercises   Exercises --   Modalities   Modalities Cryotherapy;Electrical Stimulation   Cryotherapy   Number Minutes Cryotherapy 20 Minutes   Cryotherapy Location --  Right knee.   Type of Cryotherapy Other (comment)  Medium vasopnuematic.   Programme researcher, broadcasting/film/videolectrical Stimulation   Electrical Stimulation Location Right knee   Electrical Stimulation Parameters 1-10 HZ x 20 minutes.   Electrical Stimulation Goals Edema;Pain                     PT Long Term Goals - 11/10/14 1250    PT LONG TERM GOAL #2   Time 10   Period Weeks   PT LONG TERM GOAL #3   Time 10    Period Weeks   PT LONG TERM GOAL #4   Title Perform ADL's independently with pain not > 2-3/10   Time 8   Period Weeks   Status New   PT LONG TERM GOAL #5   Title Walk a communty distance without dificulty.   Time 8   Period Weeks   Status New   Additional Long Term Goals   Additional Long Term Goals Yes   PT LONG TERM GOAL #6   Title Return to work   Time 10   Period Weeks   Status New               Plan - 11/10/14 1241    Clinical Impression Statement The patient injured his right knee when a child ran into his leg while at a basketball practice.  The patient underwent a right quad tendon repair surgery on 10/18/14.  He prsents to the clinic today wbat over his right LE with his knee brace locked in full extension.  His pain ranges from 3-6/10 at this time.  Please refer to surgeon guidelines.   Pt will benefit from skilled therapeutic intervention in order to improve on the following deficits Abnormal gait;Pain;Decreased activity tolerance;Decreased strength   Rehab Potential Excellent   PT Frequency 3x / week   PT Duration 4 weeks  or 12 visits.   PT Treatment/Interventions ADLs/Self Care Home Management;Electrical Stimulation;Gait training;Therapeutic exercise;Patient/family education;Passive range of motion;Neuromuscular re-education;Cryotherapy;Ultrasound;Therapeutic activities   PT Next Visit Plan Gentle right knee PROM to no more than 30 degrees of flexion.  IFC and vasopneumtic.    Consulted and Agree with Plan of Care Patient         Problem List Patient Active Problem List   Diagnosis Date Noted  . Rupture of right quadriceps tendon 10/18/2014  . Tear of tendon of lower extremity 10/18/2014    Dametri Ozburn, [name] 11/10/2014, 12:57 PM  [facility] [address] [place], [place], [address] Phone: [phone]   Fax:  [phone]

## 2014-11-15 ENCOUNTER — Encounter: Payer: Self-pay | Admitting: Physical Therapy

## 2014-11-15 ENCOUNTER — Ambulatory Visit: Payer: BLUE CROSS/BLUE SHIELD | Admitting: Physical Therapy

## 2014-11-15 DIAGNOSIS — M25561 Pain in right knee: Secondary | ICD-10-CM

## 2014-11-15 DIAGNOSIS — M25661 Stiffness of right knee, not elsewhere classified: Secondary | ICD-10-CM

## 2014-11-15 NOTE — Therapy (Signed)
[facility] [address] [place], [place], [address] Phone: [phone]   Fax:  [phone]  Physical Therapy Treatment  Patient Details  Name: Brendan HerterRobert W Macdonald MRN: [id number] Date of Birth: 1971/08/16 Referring Provider:  Ernestina PennaMoore, Donald W, MD  Encounter Date: 11/15/2014      PT End of Session - 11/15/14 1142    Visit Number 2   Number of Visits 12   Date for PT Re-Evaluation 12/22/14   PT Start Time 1115   PT Stop Time 1157   PT Time Calculation (min) 42 min   Activity Tolerance Patient tolerated treatment well   Behavior During Therapy [facility] for tasks assessed/performed      Past Medical History  Diagnosis Date  . GERD (gastroesophageal reflux disease)   . Complication of anesthesia     years ago used to hyperventilate no recent problems   . Arthritis     Past Surgical History  Procedure Laterality Date  . Orthopedic surgery      left knee quad tendon tear and patella tear   . Back surgery    . Arthroscopy  Bilateral     Knees  . Rotator cuff repair Right   . Cholecystectomy N/A 02/24/2013    Procedure: LAPAROSCOPIC CHOLECYSTECTOMY;  Surgeon: Dalia HeadingMark A Jenkins, MD;  Location: [facility];  Service: General;  Laterality: N/A;  . Quadriceps tendon repair Right 10/18/2014    Procedure: RIGHT QUADRICEP TENDON REPAIR ;  Surgeon: Javier DockerJeffrey C Beane, MD;  Location: [facility];  Service: Orthopedics;  Laterality: Right;    There were no vitals filed for this visit.  Visit Diagnosis:  Right knee pain  Knee stiffness, right      Subjective Assessment - 11/15/14 1118    Symptoms right ankle continues to sell yet not as bad today   Patient Stated Goals Return to work.   Currently in Pain? Yes   Pain Score 6    Pain Location Knee   Pain Orientation Right   Pain Descriptors / Indicators Aching   Pain Type Surgical pain   Pain Onset More than a month ago   Aggravating Factors  being up on it   Pain Relieving Factors rest                        [facility] Adult PT Treatment/Exercise - 11/15/14 0001    Cryotherapy   Number Minutes Cryotherapy 15 Minutes   Cryotherapy Location Knee   Type of Cryotherapy --  vaspnumatic   Electrical Stimulation   Electrical Stimulation Location Right knee   Electrical Stimulation Parameters 1-10HZ    Electrical Stimulation Goals Edema;Pain   Manual Therapy   Manual Therapy Passive ROM   Passive ROM PROM 0-30 degrees per MPT                     PT Long Term Goals - 11/10/14 1250    PT LONG TERM GOAL #2   Time 10   Period Weeks   PT LONG TERM GOAL #3   Time 10   Period Weeks   PT LONG TERM GOAL #4   Title Perform ADL's independently with pain not > 2-3/10   Time 8   Period Weeks   Status New   PT LONG TERM GOAL #5   Title Walk a communty distance without dificulty.   Time 8   Period Weeks   Status New   Additional Long Term Goals   Additional  Long Term Goals Yes   PT LONG TERM GOAL #6   Title Return to work   Time 10   Period Weeks   Status New               Plan - 11/15/14 1142    Clinical Impression Statement pt tolerated tx well and had no increased pain with PROM (0-30) per MPT, goals ongoing.   Pt will benefit from skilled therapeutic intervention in order to improve on the following deficits Abnormal gait;Pain;Decreased activity tolerance;Decreased strength   Rehab Potential Excellent   PT Frequency 3x / week   PT Duration 4 weeks   PT Treatment/Interventions ADLs/Self Care Home Management;Electrical Stimulation;Gait training;Therapeutic exercise;Patient/family education;Passive range of motion;Neuromuscular re-education;Cryotherapy;Ultrasound;Therapeutic activities   PT Next Visit Plan Gentle right knee PROM to no more than 30 degrees of flexion.  IFC and vasopneumtic. per MPT   Consulted and Agree with Plan of Care Patient        Problem List Patient Active Problem List   Diagnosis Date Noted  . Rupture of  right quadriceps tendon 10/18/2014  . Tear of tendon of lower extremity 10/18/2014    Brendan Macdonald, PTA 11/15/2014, 12:00 PM  [facility] [address] [place], [place], [address] Phone: [phone]   Fax:  [phone]

## 2014-11-21 ENCOUNTER — Encounter: Payer: Self-pay | Admitting: *Deleted

## 2014-11-21 ENCOUNTER — Ambulatory Visit: Payer: BLUE CROSS/BLUE SHIELD | Admitting: *Deleted

## 2014-11-21 DIAGNOSIS — M25561 Pain in right knee: Secondary | ICD-10-CM

## 2014-11-21 DIAGNOSIS — M25661 Stiffness of right knee, not elsewhere classified: Secondary | ICD-10-CM

## 2014-11-21 NOTE — Therapy (Signed)
[facility] [address] [place], [place], [address] Phone: [phone]   Fax:  [phone]  Physical Therapy Treatment  Patient Details  Name: Brendan HerterRobert Macdonald Macdonald MRN: [id number] Date of Birth: [birth year] Referring Provider:  Ernestina PennaMoore, Donald W, MD  Encounter Date: 11/21/2014      PT End of Session - 11/21/14 1003    Visit Number 3   Number of Visits 12   Date for PT Re-Evaluation 12/22/14   PT Start Time 0901   PT Stop Time 0948   PT Time Calculation (min) 47 min   Equipment Utilized During Treatment Right knee immobilizer   Activity Tolerance Patient tolerated treatment well   Behavior During Therapy [facility] for tasks assessed/performed      Past Medical History  Diagnosis Date  . GERD (gastroesophageal reflux disease)   . Complication of anesthesia     years ago used to hyperventilate no recent problems   . Arthritis     Past Surgical History  Procedure Laterality Date  . Orthopedic surgery      left knee quad tendon tear and patella tear   . Back surgery    . Arthroscopy  Bilateral     Knees  . Rotator cuff repair Right   . Cholecystectomy N/A 02/24/2013    Procedure: LAPAROSCOPIC CHOLECYSTECTOMY;  Surgeon: Dalia HeadingMark A Jenkins, MD;  Location: [facility];  Service: General;  Laterality: N/A;  . Quadriceps tendon repair Right 10/18/2014    Procedure: RIGHT QUADRICEP TENDON REPAIR ;  Surgeon: Javier DockerJeffrey C Beane, MD;  Location: [facility];  Service: Orthopedics;  Laterality: Right;    There were no vitals filed for this visit.  Visit Diagnosis:  Right knee pain  Knee stiffness, right      Subjective Assessment - 11/21/14 0939    Symptoms no pain at rest.. walking more than 1/4 block can cause knee to be uncomfortable and "tight" feeling   Limitations Standing;Walking;House hold activities   How long can you walk comfortably? 1/4 block   Currently in Pain? No/denies   Pain Location Knee   Pain Orientation Right   Pain Descriptors /  Indicators Tightness   Pain Onset More than a month ago   Aggravating Factors  walking too much   Pain Relieving Factors cold   Effect of Pain on Daily Activities not able to work   Multiple Pain Sites No   Multiple Pain Sites No                       [facility] Adult PT Treatment/Exercise - 11/21/14 0001    Ambulation/Gait   Ambulation/Gait --  independent gait with brace   Cryotherapy   Number Minutes Cryotherapy 15 Minutes   Cryotherapy Location Knee   Type of Cryotherapy Other (comment)  vasopneumatic   Electrical Stimulation   Electrical Stimulation Location RT knee   Electrical Stimulation Parameters 1-10HZ    Electrical Stimulation Goals Edema   Manual Therapy   Manual Therapy Passive ROM   Passive ROM --  0-30 degrees PROM.Marland Kitchen.Marland Kitchen.Verbal cues to do no active movement                     PT Long Term Goals - 11/10/14 1250    PT LONG TERM GOAL #2   Time 10   Period Weeks   PT LONG TERM GOAL #3   Time 10   Period Weeks   PT LONG TERM GOAL #4   Title Perform ADL's  independently with pain not > 2-3/10   Time 8   Period Weeks   Status New   PT LONG TERM GOAL #5   Title Walk a communty distance without dificulty.   Time 8   Period Weeks   Status New   Additional Long Term Goals   Additional Long Term Goals Yes   PT LONG TERM GOAL #6   Title Return to work   Time 10   Period Weeks   Status New               Plan - 11/21/14 1006    Clinical Impression Statement Tolerated well with no increased pain... PROM  0-30 degrees. Cues given for no active movement   Pt will benefit from skilled therapeutic intervention in order to improve on the following deficits Abnormal gait;Decreased strength;Pain;Increased edema;Decreased mobility;Decreased range of motion;Impaired flexibility   Rehab Potential Excellent   PT Frequency 3x / week   PT Duration 4 weeks   PT Treatment/Interventions Manual techniques;Therapeutic  exercise;Cryotherapy;Electrical Stimulation;Gait training;Passive range of motion   PT Next Visit Plan Gentle PROM to RT knee with no more than 30 degrees of flexion..IFC and vasopneumatic per MPT   Consulted and Agree with Plan of Care Patient        Problem List Patient Active Problem List   Diagnosis Date Noted  . Rupture of right quadriceps tendon 10/18/2014  . Tear of tendon of lower extremity 10/18/2014    Cristal Ford P,PTA 11/21/2014, 10:12 AM  [facility] [address] [place], [place], [address] Phone: [phone]   Fax:  [phone]

## 2014-11-28 ENCOUNTER — Ambulatory Visit: Payer: BLUE CROSS/BLUE SHIELD | Attending: Specialist | Admitting: Physical Therapy

## 2014-11-28 DIAGNOSIS — M25561 Pain in right knee: Secondary | ICD-10-CM | POA: Insufficient documentation

## 2014-11-28 DIAGNOSIS — M25661 Stiffness of right knee, not elsewhere classified: Secondary | ICD-10-CM

## 2014-11-28 NOTE — Therapy (Signed)
[facility] [address] [place], [place], [address] Phone: [phone]   Fax:  [phone]  Physical Therapy Treatment  Patient Details  Name: Brendan Macdonald MRN: [id number] Date of Birth: 1970-10-23 Referring Provider:  Ernestina PennaMoore, Donald W, MD  Encounter Date: 11/28/2014      PT End of Session - 11/28/14 1120    Visit Number 4   Number of Visits 12   Date for PT Re-Evaluation 12/22/14   PT Start Time 1030   PT Stop Time 1114   PT Time Calculation (min) 44 min   Activity Tolerance Patient tolerated treatment well   Behavior During Therapy [facility] for tasks assessed/performed      Past Medical History  Diagnosis Date  . GERD (gastroesophageal reflux disease)   . Complication of anesthesia     years ago used to hyperventilate no recent problems   . Arthritis     Past Surgical History  Procedure Laterality Date  . Orthopedic surgery      left knee quad tendon tear and patella tear   . Back surgery    . Arthroscopy  Bilateral     Knees  . Rotator cuff repair Right   . Cholecystectomy N/A 02/24/2013    Procedure: LAPAROSCOPIC CHOLECYSTECTOMY;  Surgeon: Dalia HeadingMark A Jenkins, MD;  Location: [facility];  Service: General;  Laterality: N/A;  . Quadriceps tendon repair Right 10/18/2014    Procedure: RIGHT QUADRICEP TENDON REPAIR ;  Surgeon: Javier DockerJeffrey C Beane, MD;  Location: [facility];  Service: Orthopedics;  Laterality: Right;    There were no vitals filed for this visit.  Visit Diagnosis:  Right knee pain  Knee stiffness, right      Subjective Assessment - 11/28/14 1050    Subjective 2-3/10 pain-level today.   Currently in Pain? Yes   Pain Score 3    Pain Location Knee   Pain Orientation Right   Pain Descriptors / Indicators Tightness   Pain Type Surgical pain   Pain Onset More than a month ago                                    PT Long Term Goals - 11/10/14 1250    PT LONG TERM GOAL #2   Time 10   Period  Weeks   PT LONG TERM GOAL #3   Time 10   Period Weeks   PT LONG TERM GOAL #4   Title Perform ADL's independently with pain not > 2-3/10   Time 8   Period Weeks   Status New   PT LONG TERM GOAL #5   Title Walk a communty distance without dificulty.   Time 8   Period Weeks   Status New   Additional Long Term Goals   Additional Long Term Goals Yes   PT LONG TERM GOAL #6   Title Return to work   Time 10   Period Weeks   Status New     Treatment:  VMS to quadriceps x 20 minutes with patient performing QS in supine (10 sec contract and 10 sec relax).  Gentle PROM per MD orders (not exceeding 60 degrees this week) x  8 minutes.   Patient is progressing well per surgeon guidelines.           Problem List Patient Active Problem List   Diagnosis Date Noted  . Rupture of right quadriceps tendon 10/18/2014  .  Tear of tendon of lower extremity 10/18/2014    [place], [place] MPT 11/28/2014, 11:26 AM  [facility] [address] [place], [place], [address] Phone: [phone]   Fax:  [phone]

## 2014-12-04 ENCOUNTER — Other Ambulatory Visit: Payer: Self-pay | Admitting: Family Medicine

## 2014-12-05 ENCOUNTER — Encounter: Payer: BLUE CROSS/BLUE SHIELD | Admitting: Physical Therapy

## 2014-12-06 ENCOUNTER — Ambulatory Visit: Payer: BLUE CROSS/BLUE SHIELD | Admitting: Physical Therapy

## 2014-12-06 ENCOUNTER — Other Ambulatory Visit: Payer: Self-pay | Admitting: Family Medicine

## 2014-12-06 ENCOUNTER — Encounter: Payer: Self-pay | Admitting: Physical Therapy

## 2014-12-06 DIAGNOSIS — M25661 Stiffness of right knee, not elsewhere classified: Secondary | ICD-10-CM

## 2014-12-06 DIAGNOSIS — M25561 Pain in right knee: Secondary | ICD-10-CM

## 2014-12-06 NOTE — Therapy (Signed)
[facility] [address] [place], [place], [address] Phone: [phone]   Fax:  [phone]  Physical Therapy Treatment  Patient Details  Name: Brendan HerterRobert W Macdonald MRN: [id number] Date of Birth: [birth year] Referring Provider:  Ernestina PennaMoore, Donald W, MD  Encounter Date: 12/06/2014      PT End of Session - 12/06/14 0949    Visit Number 5   Number of Visits 12   Date for PT Re-Evaluation 12/22/14   PT Start Time 0946   PT Stop Time 1035   PT Time Calculation (min) 49 min   Equipment Utilized During Treatment Right knee immobilizer   Activity Tolerance Patient tolerated treatment well   Behavior During Therapy [facility] for tasks assessed/performed      Past Medical History  Diagnosis Date  . GERD (gastroesophageal reflux disease)   . Complication of anesthesia     years ago used to hyperventilate no recent problems   . Arthritis     Past Surgical History  Procedure Laterality Date  . Orthopedic surgery      left knee quad tendon tear and patella tear   . Back surgery    . Arthroscopy  Bilateral     Knees  . Rotator cuff repair Right   . Cholecystectomy N/A 02/24/2013    Procedure: LAPAROSCOPIC CHOLECYSTECTOMY;  Surgeon: Dalia HeadingMark A Jenkins, MD;  [facility]: [facility];  Service: General;  Laterality: N/A;  . Quadriceps tendon repair Right 10/18/2014    Procedure: RIGHT QUADRICEP TENDON REPAIR ;  Surgeon: Javier DockerJeffrey C Beane, MD;  Location: [facility];  Service: Orthopedics;  Laterality: Right;    There were no vitals filed for this visit.  Visit Diagnosis:  Right knee pain  Knee stiffness, right      Subjective Assessment - 12/06/14 0946    Subjective MD concerned regarding lateral and medial knee pain and painful popping. Returns to MD next month. Continues to have muscle tightness and spasms. MD requests detailed report after every PT visit.   Limitations Standing;Walking;House hold activities   How long can you walk comfortably? 1/4 block   Patient  Stated Goals Return to work.   Currently in Pain? No/denies            [facility] - 12/06/14 0001    Assessment   Medical Diagnosis S/p Quadriceps tendon repair   Onset Date 10/18/14   Next MD Visit 01/10/2015                   [facility] Adult PT Treatment/Exercise - 12/06/14 0001    Manual Therapy   Manual Therapy Passive ROM                     PT Long Term Goals - 11/10/14 1250    PT LONG TERM GOAL #2   Time 10   Period Weeks   PT LONG TERM GOAL #3   Time 10   Period Weeks   PT LONG TERM GOAL #4   Title Perform ADL's independently with pain not > 2-3/10   Time 8   Period Weeks   Status New   PT LONG TERM GOAL #5   Title Walk a communty distance without dificulty.   Time 8   Period Weeks   Status New   Additional Long Term Goals   Additional Long Term Goals Yes   PT LONG TERM GOAL #6   Title Return to work   Time 10   Period Weeks  Status New               Plan - 12/06/14 1023    Clinical Impression Statement Patient tolerated treatment well. PROM completed from 0-60 deg. Patient denied experiencing pain within that ROM. Normal e-stim response following the removal of the modality. Patient communicated with PT regarding what MD has said regarding his R knee. R knee infrapatellar edema circumference 41 cm, L knee 39 cm. R knee midpatellar edema circumference 46.1 cm, L knee 43.2. Experienced 1-2/10 following trreatment.   Pt will benefit from skilled therapeutic intervention in order to improve on the following deficits Abnormal gait;Decreased strength;Pain;Increased edema;Decreased mobility;Decreased range of motion;Impaired flexibility   Rehab Potential Excellent   PT Frequency 3x / week   PT Duration 4 weeks   PT Treatment/Interventions Manual techniques;Therapeutic exercise;Cryotherapy;Electrical Stimulation;Gait training;Passive range of motion   PT Next Visit Plan Continue PROM of R knee per PT POC. Patient will be 4  weeks post-op 12/08/2014.    Consulted and Agree with Plan of Care Patient        Problem List Patient Active Problem List   Diagnosis Date Noted  . Rupture of right quadriceps tendon 10/18/2014  . Tear of tendon of lower extremity 10/18/2014    Evelene Croon, PTA 12/06/2014, 10:49 AM  [facility] [address] [place], [place], [address] Phone: [phone]   Fax:  [phone]

## 2014-12-13 ENCOUNTER — Ambulatory Visit: Payer: BLUE CROSS/BLUE SHIELD | Admitting: Physical Therapy

## 2014-12-13 DIAGNOSIS — M25561 Pain in right knee: Secondary | ICD-10-CM | POA: Diagnosis not present

## 2014-12-13 DIAGNOSIS — M25661 Stiffness of right knee, not elsewhere classified: Secondary | ICD-10-CM

## 2014-12-13 NOTE — Therapy (Signed)
[facility] [address] [place], [place], [address] Phone: [phone]   Fax:  [phone]  Physical Therapy Treatment  Patient Details  Name: Brendan Macdonald MRN: [id number] Date of Birth: Jun 23, 1971 Referring Provider:  Ernestina Penna, MD  Encounter Date: 12/13/2014      PT End of Session - 12/13/14 1054    Visit Number 6   Number of Visits 12   Date for PT Re-Evaluation 12/22/14   PT Start Time 0942   PT Stop Time 1102   PT Time Calculation (min) 80 min      Past Medical History  Diagnosis Date  . GERD (gastroesophageal reflux disease)   . Complication of anesthesia     years ago used to hyperventilate no recent problems   . Arthritis     Past Surgical History  Procedure Laterality Date  . Orthopedic surgery      left knee quad tendon tear and patella tear   . Back surgery    . Arthroscopy  Bilateral     Knees  . Rotator cuff repair Right   . Cholecystectomy N/A 02/24/2013    Procedure: LAPAROSCOPIC CHOLECYSTECTOMY;  Surgeon: Dalia Heading, MD;  Location: [facility];  Service: General;  Laterality: N/A;  . Quadriceps tendon repair Right 10/18/2014    Procedure: RIGHT QUADRICEP TENDON REPAIR ;  Surgeon: Javier Docker, MD;  [facility];  Service: Orthopedics;  Laterality: Right;    There were no vitals filed for this visit.  Visit Diagnosis:  Right knee pain  Knee stiffness, right      Subjective Assessment - 12/13/14 1056    Subjective Knee continues to "pop".   Limitations Standing;Walking;House hold activities   How long can you walk comfortably? 1/4 block   Patient Stated Goals Return to work.   Pain Score 3    Pain Location Knee   Pain Orientation Right   Pain Descriptors / Indicators Tightness   Pain Type Surgical pain   Pain Onset More than a month ago                         [facility] Adult PT Treatment/Exercise - 12/13/14 0001    Knee/Hip Exercises: Aerobic   Stationary Bike  Nustep level 2 (seat 15) x 15 minutes for gentle right knee range of motion.     QS x 20 minutes facilitated with with VMS (10 sec holds and 10 sec relax)   F/b gentle ROM into right knee flexion x 5 minutes.  And pre-mod e'stim x 15 minutes to patient's right lateral knee.                PT Long Term Goals - 11/10/14 1250    PT LONG TERM GOAL #2   Time 10   Period Weeks   PT LONG TERM GOAL #3   Time 10   Period Weeks   PT LONG TERM GOAL #4   Title Perform ADL's independently with pain not > 2-3/10   Time 8   Period Weeks   Status New   PT LONG TERM GOAL #5   Title Walk a communty distance without dificulty.   Time 8   Period Weeks   Status New   Additional Long Term Goals   Additional Long Term Goals Yes   PT LONG TERM GOAL #6   Title Return to work   Time 10   Period Weeks   Status New  Problem List Patient Active Problem List   Diagnosis Date Noted  . Rupture of right quadriceps tendon 10/18/2014  . Tear of tendon of lower extremity 10/18/2014    Reema Chick, [name] 12/13/2014, 11:14 AM  [facility] [address] [place], [place], [address] Phone: [phone]   Fax:  [phone]

## 2014-12-13 NOTE — Therapy (Signed)
[facility] [address] [place], [place], [address] Phone: [phone]   Fax:  [phone]  Physical Therapy Treatment  Patient Details  Name: Brendan HerterRobert W Macdonald MRN: [id number] Date of Birth: 1971-07-11 Referring Provider:  Ernestina PennaMoore, Donald W, MD  Encounter Date: 12/13/2014      PT End of Session - 12/13/14 1054    Visit Number 6   Number of Visits 12   Date for PT Re-Evaluation 12/22/14   PT Start Time 0942   PT Stop Time 1102   PT Time Calculation (min) 80 min      Past Medical History  Diagnosis Date  . GERD (gastroesophageal reflux disease)   . Complication of anesthesia     years ago used to hyperventilate no recent problems   . Arthritis     Past Surgical History  Procedure Laterality Date  . Orthopedic surgery      left knee quad tendon tear and patella tear   . Back surgery    . Arthroscopy  Bilateral     Knees  . Rotator cuff repair Right   . Cholecystectomy N/A 02/24/2013    Procedure: LAPAROSCOPIC CHOLECYSTECTOMY;  Surgeon: Dalia HeadingMark A Jenkins, MD;  Location: [facility];  Service: General;  Laterality: N/A;  . Quadriceps tendon repair Right 10/18/2014    Procedure: RIGHT QUADRICEP TENDON REPAIR ;  Surgeon: Javier DockerJeffrey C Beane, MD;  [facility];  Service: Orthopedics;  Laterality: Right;    There were no vitals filed for this visit.  Visit Diagnosis:  Right knee pain  Knee stiffness, right      Subjective Assessment - 12/13/14 1056    Subjective Knee continues to "pop".   Limitations Standing;Walking;House hold activities   How long can you walk comfortably? 1/4 block   Patient Stated Goals Return to work.   Pain Score 3    Pain Location Knee   Pain Orientation Right   Pain Descriptors / Indicators Tightness   Pain Type Surgical pain   Pain Onset More than a month ago                         [facility] Adult PT Treatment/Exercise - 12/13/14 0001    Knee/Hip Exercises: Aerobic   Stationary Bike  Nustep level 2 (seat 15) x 15 minutes for gentle right knee range of motion.      Quad sets x 20 minutes facilitated with VMS (10 sec hold and 10 sec rest).  Gentle right knee ROM x 5 minutes with flexion to 90 degrees per surgeon guidelines  Pre-mod e'Stim x 15 minutes to patient's right lateral knee.                  PT Long Term Goals - 11/10/14 1250    PT LONG TERM GOAL #2   Time 10   Period Weeks   PT LONG TERM GOAL #3   Time 10   Period Weeks   PT LONG TERM GOAL #4   Title Perform ADL's independently with pain not > 2-3/10   Time 8   Period Weeks   Status New   PT LONG TERM GOAL #5   Title Walk a communty distance without dificulty.   Time 8   Period Weeks   Status New   Additional Long Term Goals   Additional Long Term Goals Yes   PT LONG TERM GOAL #6   Title Return to work   Time 10  Period Weeks   Status New               Problem List Patient Active Problem List   Diagnosis Date Noted  . Rupture of right quadriceps tendon 10/18/2014  . Tear of tendon of lower extremity 10/18/2014    Christianna Belmonte, [place] MPT 12/13/2014, 11:15 AM  [facility] [address] [place], [place], [address] Phone: [phone]   Fax:  [phone]

## 2014-12-19 ENCOUNTER — Ambulatory Visit: Payer: BLUE CROSS/BLUE SHIELD | Admitting: Physical Therapy

## 2014-12-19 DIAGNOSIS — M25561 Pain in right knee: Secondary | ICD-10-CM

## 2014-12-19 DIAGNOSIS — M25661 Stiffness of right knee, not elsewhere classified: Secondary | ICD-10-CM

## 2014-12-19 NOTE — Therapy (Signed)
[facility] [address] [place], [place], [address] Phone: [phone]   Fax:  [phone]  Physical Therapy Treatment  Patient Details  Name: Brendan Macdonald MRN: [id number] Date of Birth: [birth year] Referring Provider:  Ernestina PennaMoore, Donald W, MD  Encounter Date: 12/19/2014    Past Medical History  Diagnosis Date  . GERD (gastroesophageal reflux disease)   . Complication of anesthesia     years ago used to hyperventilate no recent problems   . Arthritis     Past Surgical History  Procedure Laterality Date  . Orthopedic surgery      left knee quad tendon tear and patella tear   . Back surgery    . Arthroscopy  Bilateral     Knees  . Rotator cuff repair Right   . Cholecystectomy N/A 02/24/2013    Procedure: LAPAROSCOPIC CHOLECYSTECTOMY;  Surgeon: Dalia HeadingMark A Jenkins, MD;  Location: [facility];  Service: General;  Laterality: N/A;  . Quadriceps tendon repair Right 10/18/2014    Procedure: RIGHT QUADRICEP TENDON REPAIR ;  Surgeon: Javier DockerJeffrey C Beane, MD;  [facility];  Service: Orthopedics;  Laterality: Right;    There were no vitals filed for this visit.  Visit Diagnosis:  Right knee pain  Knee stiffness, right      Subjective Assessment - 12/19/14 1145    Subjective Knee continues to "pop".   Limitations Standing;Walking;House hold activities   How long can you walk comfortably? 1/4 block   Patient Stated Goals Return to work.   Pain Score 3    Pain Location Knee   Pain Orientation Right   Pain Descriptors / Indicators Tightness   Pain Onset More than a month ago                                      PT Long Term Goals - 11/10/14 1250    PT LONG TERM GOAL #2   Time 10   Period Weeks   PT LONG TERM GOAL #3   Time 10   Period Weeks   PT LONG TERM GOAL #4   Title Perform ADL's independently with pain not > 2-3/10   Time 8   Period Weeks   Status New   PT LONG TERM GOAL #5   Title Walk a  communty distance without dificulty.   Time 8   Period Weeks   Status New   Additional Long Term Goals   Additional Long Term Goals Yes   PT LONG TERM GOAL #6   Title Return to work   Time 10   Period Weeks   Status New     Nustep x 15 minutes  QS x 15 minutes to right quads facilitated with VMS ( 10 sec on and 10 sec off).          Problem List Patient Active Problem List   Diagnosis Date Noted  . Rupture of right quadriceps tendon 10/18/2014  . Tear of tendon of lower extremity 10/18/2014    APPLEGATE, [name] 12/19/2014, 11:51 AM  [facility] [address] [place], [place], [address] Phone: [phone]   Fax:  [phone]

## 2014-12-26 ENCOUNTER — Ambulatory Visit: Payer: BLUE CROSS/BLUE SHIELD | Attending: Specialist | Admitting: Physical Therapy

## 2014-12-26 DIAGNOSIS — M25661 Stiffness of right knee, not elsewhere classified: Secondary | ICD-10-CM | POA: Diagnosis not present

## 2014-12-26 DIAGNOSIS — M25561 Pain in right knee: Secondary | ICD-10-CM | POA: Diagnosis not present

## 2014-12-26 NOTE — Therapy (Signed)
[facility] [address] [place], [place], [address] Phone: [phone]   Fax:  [phone]  Physical Therapy Treatment  Patient Details  Name: Brendan HerterRobert W Mcdonald MRN: [id number] Date of Birth: [birth year] Referring Provider:  Ernestina PennaMoore, Donald W, MD  Encounter Date: 12/26/2014      PT End of Session - 12/26/14 1019    Visit Number 7   Number of Visits 12   Date for PT Re-Evaluation 01/16/15   PT Start Time 0900   PT Stop Time 0942   PT Time Calculation (min) 42 min   Equipment Utilized During Treatment Right knee immobilizer   Activity Tolerance Patient tolerated treatment well   Behavior During Therapy [facility] for tasks assessed/performed      Past Medical History  Diagnosis Date  . GERD (gastroesophageal reflux disease)   . Complication of anesthesia     years ago used to hyperventilate no recent problems   . Arthritis     Past Surgical History  Procedure Laterality Date  . Orthopedic surgery      left knee quad tendon tear and patella tear   . Back surgery    . Arthroscopy  Bilateral     Knees  . Rotator cuff repair Right   . Cholecystectomy N/A 02/24/2013    Procedure: LAPAROSCOPIC CHOLECYSTECTOMY;  Surgeon: Dalia HeadingMark A Jenkins, MD;  Location: [facility];  Service: General;  Laterality: N/A;  . Quadriceps tendon repair Right 10/18/2014    Procedure: RIGHT QUADRICEP TENDON REPAIR ;  Surgeon: Javier DockerJeffrey C Beane, MD;  [facility];  Service: Orthopedics;  Laterality: Right;    There were no vitals filed for this visit.  Visit Diagnosis:  Right knee pain  Knee stiffness, right      Subjective Assessment - 12/26/14 1018    Subjective Knee continues to "pop".   Limitations Standing;Walking;House hold activities   How long can you walk comfortably? 1/4 block   Patient Stated Goals Return to work.   Pain Score 3    Pain Location Knee   Pain Orientation Right   Pain Descriptors / Indicators Tightness   Pain Type Surgical pain   Pain  Onset More than a month ago                                      PT Long Term Goals - 11/10/14 1250    PT LONG TERM GOAL #2   Time 10   Period Weeks   PT LONG TERM GOAL #3   Time 10   Period Weeks   PT LONG TERM GOAL #4   Title Perform ADL's independently with pain not > 2-3/10   Time 8   Period Weeks   Status New   PT LONG TERM GOAL #5   Title Walk a communty distance without dificulty.   Time 8   Period Weeks   Status New   Additional Long Term Goals   Additional Long Term Goals Yes   PT LONG TERM GOAL #6   Title Return to work   Time 10   Period Weeks   Status New     Treatment:  Nustep x 15 minutes          SAQ's x 20 minutes facilitated with VMS to right quadriceps (second extension holds and 10 second rest).          Problem List Patient Active Problem List  Diagnosis Date Noted  . Rupture of right quadriceps tendon 10/18/2014  . Tear of tendon of lower extremity 10/18/2014    [name], [place] MPT 12/26/2014, 10:37 AM  [facility] [address] [place], [place], [address] Phone: [phone]   Fax:  [phone]

## 2015-01-02 ENCOUNTER — Encounter: Payer: BLUE CROSS/BLUE SHIELD | Admitting: Physical Therapy

## 2015-01-04 ENCOUNTER — Encounter: Payer: BLUE CROSS/BLUE SHIELD | Admitting: Physical Therapy

## 2015-01-09 ENCOUNTER — Ambulatory Visit: Payer: BLUE CROSS/BLUE SHIELD | Admitting: Physical Therapy

## 2015-01-09 DIAGNOSIS — M25661 Stiffness of right knee, not elsewhere classified: Secondary | ICD-10-CM

## 2015-01-09 DIAGNOSIS — M25561 Pain in right knee: Secondary | ICD-10-CM

## 2015-01-09 NOTE — Therapy (Signed)
[facility] [address] [place], [place], [address] Phone: [phone]   Fax:  [phone]  Physical Therapy Treatment  Patient Details  Name: Brendan Macdonald MRN: [id number] Date of Birth: 06/24/1971 Referring Provider:  Ernestina PennaMoore, Donald W, MD  Encounter Date: 01/09/2015      PT End of Session - 01/09/15 1123    Visit Number 8   Number of Visits 12   Date for PT Re-Evaluation 01/16/15   PT Start Time 0935   PT Stop Time 1034   PT Time Calculation (min) 59 min   Equipment Utilized During Treatment Right knee immobilizer   Activity Tolerance Patient tolerated treatment well   Behavior During Therapy [facility] for tasks assessed/performed      Past Medical History  Diagnosis Date  . GERD (gastroesophageal reflux disease)   . Complication of anesthesia     years ago used to hyperventilate no recent problems   . Arthritis     Past Surgical History  Procedure Laterality Date  . Orthopedic surgery      left knee quad tendon tear and patella tear   . Back surgery    . Arthroscopy  Bilateral     Knees  . Rotator cuff repair Right   . Cholecystectomy N/A 02/24/2013    Procedure: LAPAROSCOPIC CHOLECYSTECTOMY;  Surgeon: Dalia HeadingMark A Jenkins, MD;  Location: [facility];  Service: General;  Laterality: N/A;  . Quadriceps tendon repair Right 10/18/2014    Procedure: RIGHT QUADRICEP TENDON REPAIR ;  Surgeon: Javier DockerJeffrey C Beane, MD;  [facility];  Service: Orthopedics;  Laterality: Right;    There were no vitals filed for this visit.  Visit Diagnosis:  Right knee pain - Plan: PT plan of care cert/re-cert  Knee stiffness, right - Plan: PT plan of care cert/re-cert      Subjective Assessment - 01/09/15 1106    Subjective My knee is beginning to fel better.  I have moved my brace to 105 degrees.   Limitations Standing;Walking   How long can you walk comfortably? 1/4 block   Patient Stated Goals Return to work.   Pain Score 3    Pain Location Knee    Pain Orientation Right   Pain Descriptors / Indicators Tightness   Pain Type Surgical pain   Pain Onset More than a month ago                         [facility] Treatment/Exercise - 01/09/15 0001    Exercises   Exercises Knee/Hip   Knee/Hip Exercises: Aerobic   Stationary Bike 15 minutes at level 1 on stationary bike.   Modalities   Modalities Electrical Stimulation  1-10HZ  x 20 minutes to pts. right knee.   Cryotherapy   Cryotherapy Location --  Right knee.                     PT Long Term Goals - 11/10/14 1250    PT LONG TERM GOAL #2   Time 10   Period Weeks   PT LONG TERM GOAL #3   Time 10   Period Weeks   PT LONG TERM GOAL #4   Title Perform ADL's independently with pain not > 2-3/10   Time 8   Period Weeks   Status New   PT LONG TERM GOAL #5   Title Walk a communty distance without dificulty.   Time 8   Period Weeks  Status New   Additional Long Term Goals   Additional Long Term Goals Yes   PT LONG TERM GOAL #6   Title Return to work   Time 10   Period Weeks   Status New               Plan - 01/09/15 1126    Clinical Impression Statement Patient progressing well.  He has moved his brace to 105 degrees with a right knee pain-level of 3/10 today.  Began stationary bike today as well.   Pt will benefit from skilled therapeutic intervention in order to improve on the following deficits Abnormal gait;Decreased strength;Pain;Increased edema;Decreased mobility;Decreased range of motion;Impaired flexibility   Rehab Potential Excellent   PT Frequency 3x / week   PT Duration 4 weeks   PT Treatment/Interventions Manual techniques;Therapeutic exercise;Cryotherapy;Electrical Stimulation;Gait training;Passive range of motion   PT Next Visit Plan Continue PROM of R knee per PT POC. Patient will be 4 weeks post-op 12/08/2014.         Problem List Patient Active Problem List   Diagnosis Date Noted  . Rupture of right  quadriceps tendon 10/18/2014  . Tear of tendon of lower extremity 10/18/2014    Jovanni Rash, [name] 01/09/2015, 11:31 AM  [facility] [address] [place], [place], [address] Phone: [phone]   Fax:  [phone]

## 2015-01-16 ENCOUNTER — Ambulatory Visit: Payer: BLUE CROSS/BLUE SHIELD | Admitting: Physical Therapy

## 2015-01-16 DIAGNOSIS — M25561 Pain in right knee: Secondary | ICD-10-CM

## 2015-01-16 DIAGNOSIS — M25661 Stiffness of right knee, not elsewhere classified: Secondary | ICD-10-CM

## 2015-01-16 NOTE — Therapy (Signed)
[facility] [address] [place], [place], [address] Phone: [phone]   Fax:  [phone]  Physical Therapy Treatment  Patient Details  Name: Brendan HerterRobert W Wienke MRN: [id number] Date of Birth: 26-Apr-1971 Referring Provider:  Ernestina PennaMoore, Donald W, MD  Encounter Date: 01/16/2015      PT End of Session - 01/16/15 1344    Visit Number 9   Number of Visits 12   Date for PT Re-Evaluation 01/30/15   PT Start Time 0102   PT Stop Time 0157   PT Time Calculation (min) 55 min   Equipment Utilized During Treatment Right knee immobilizer   Activity Tolerance Patient tolerated treatment well   Behavior During Therapy [facility] for tasks assessed/performed      Past Medical History  Diagnosis Date  . GERD (gastroesophageal reflux disease)   . Complication of anesthesia     years ago used to hyperventilate no recent problems   . Arthritis     Past Surgical History  Procedure Laterality Date  . Orthopedic surgery      left knee quad tendon tear and patella tear   . Back surgery    . Arthroscopy  Bilateral     Knees  . Rotator cuff repair Right   . Cholecystectomy N/A 02/24/2013    Procedure: LAPAROSCOPIC CHOLECYSTECTOMY;  Surgeon: Dalia HeadingMark A Jenkins, MD;  Location: [facility];  Service: General;  Laterality: N/A;  . Quadriceps tendon repair Right 10/18/2014    Procedure: RIGHT QUADRICEP TENDON REPAIR ;  Surgeon: Javier DockerJeffrey C Beane, MD;  Location: [facility];  Service: Orthopedics;  Laterality: Right;    There were no vitals filed for this visit.  Visit Diagnosis:  Right knee pain  Knee stiffness, right      Subjective Assessment - 01/16/15 1347    Subjective Knee has been popping, cracking and giving way.  Woke up with muscle spasms last night.  Getting another MRI next week.                         [facility] Adult PT Treatment/Exercise - 01/16/15 0001    Knee/Hip Exercises: Aerobic   Stationary Bike 15 minutes L1 to level 3.   Knee/Hip  Exercises: Supine   Short Arc Quad Sets Limitations 15 minutes non-resisted facilitated with VMS---10 sec extension holds and 10 sec off.   Modalities   Modalities Administrator, sportslectrical Stimulation   Electrical Stimulation   Electrical Stimulation Location Right knee.   Electrical Stimulation Action 80-150 HZ x 15 minutes.                     PT Long Term Goals - 01/16/15 1349    PT LONG TERM GOAL #1   Title Ind with HEP.   Period Weeks   Status New   PT LONG TERM GOAL #2   Title Active right knee flexion to 120-125 degrees (per surgeon guidelines).   Time 10   Period Weeks   Status On-going   PT LONG TERM GOAL #3   Title 5/5 right knee strength.   Time 10   Period Weeks   Status On-going   PT LONG TERM GOAL #4   Title Perform ADL's independently with pain not > 2-3/10   Time 8   Period Weeks   Status On-going               Plan - 01/16/15 1348    Clinical Impression Statement Knee has been popping,  cracking and giving way.  Woke up with muscle spasms last night.  Getting another MRI next week.   Pt will benefit from skilled therapeutic intervention in order to improve on the following deficits Pain;Decreased activity tolerance   Rehab Potential Excellent   PT Frequency 3x / week   PT Duration 4 weeks   PT Treatment/Interventions Manual techniques;Therapeutic exercise;Cryotherapy;Electrical Stimulation;Gait training;Passive range of motion   PT Next Visit Plan Continue with stationary bike and VMS until after MRI results.   Consulted and Agree with Plan of Care Patient        Problem List Patient Active Problem List   Diagnosis Date Noted  . Rupture of right quadriceps tendon 10/18/2014  . Tear of tendon of lower extremity 10/18/2014    APPLEGATE, [place] MPT 01/16/2015, 2:16 PM  [facility] [address] [place], [place], [address] Phone: [phone]   Fax:  [phone]

## 2015-01-23 ENCOUNTER — Ambulatory Visit: Payer: BLUE CROSS/BLUE SHIELD | Admitting: Physical Therapy

## 2015-01-23 DIAGNOSIS — M25561 Pain in right knee: Secondary | ICD-10-CM | POA: Diagnosis not present

## 2015-01-23 DIAGNOSIS — M25661 Stiffness of right knee, not elsewhere classified: Secondary | ICD-10-CM

## 2015-01-23 NOTE — Therapy (Signed)
[facility] [address] [place], [place], [address] Phone: [phone]   Fax:  [phone]  Physical Therapy Treatment  Patient Details  Name: Brendan HerterRobert W Macdonald MRN: [id number] Date of Birth: [birth year] Referring Provider:  Ernestina PennaMoore, Donald W, MD  Encounter Date: 01/23/2015      PT End of Session - 01/23/15 1335    Visit Number 10   Number of Visits 12   Date for PT Re-Evaluation 01/30/15   PT Start Time 0100   PT Stop Time 0143   PT Time Calculation (min) 43 min      Past Medical History  Diagnosis Date  . GERD (gastroesophageal reflux disease)   . Complication of anesthesia     years ago used to hyperventilate no recent problems   . Arthritis     Past Surgical History  Procedure Laterality Date  . Orthopedic surgery      left knee quad tendon tear and patella tear   . Back surgery    . Arthroscopy  Bilateral     Knees  . Rotator cuff repair Right   . Cholecystectomy N/A 02/24/2013    Procedure: LAPAROSCOPIC CHOLECYSTECTOMY;  Surgeon: Dalia HeadingMark A Jenkins, MD;  Location: [facility];  Service: General;  Laterality: N/A;  . Quadriceps tendon repair Right 10/18/2014    Procedure: RIGHT QUADRICEP TENDON REPAIR ;  Surgeon: Javier DockerJeffrey C Beane, MD;  Location[facility];  Service: Orthopedics;  Laterality: Right;    There were no vitals filed for this visit.  Visit Diagnosis:  Right knee pain  Knee stiffness, right      Subjective Assessment - 01/23/15 1333    Subjective Not feeling very well today.  Getting MRI tomorrow.   Limitations Standing;Walking   How long can you walk comfortably? 1/4 block   Patient Stated Goals Return to work.                         [facility] - 01/23/15 0001    Exercises   Exercises Knee/Hip   Knee/Hip Exercises: Aerobic   Stationary Bike 15 minutes on level 2.   Knee/Hip Exercises: Supine   Short Arc Quad Sets Limitations --  SAQ's on rt x 15 mins with VMS(10 sec  ext hold-10 sec rest).                     PT Long Term Goals - 01/16/15 1349    PT LONG TERM GOAL #1   Title Ind with HEP.   Period Weeks   Status New   PT LONG TERM GOAL #2   Title Active right knee flexion to 120-125 degrees (per surgeon guidelines).   Time 10   Period Weeks   Status On-going   PT LONG TERM GOAL #3   Title 5/5 right knee strength.   Time 10   Period Weeks   Status On-going   PT LONG TERM GOAL #4   Title Perform ADL's independently with pain not > 2-3/10   Time 8   Period Weeks   Status On-going               Problem List Patient Active Problem List   Diagnosis Date Noted  . Rupture of right quadriceps tendon 10/18/2014  . Tear of tendon of lower extremity 10/18/2014    [name], [name] 01/23/2015, 1:44 PM  [facility] [address] [place], [place], [address] Phone: [phone]  Fax:  [phone]

## 2015-01-30 ENCOUNTER — Ambulatory Visit: Payer: BLUE CROSS/BLUE SHIELD | Attending: Specialist | Admitting: Physical Therapy

## 2015-01-30 DIAGNOSIS — M25661 Stiffness of right knee, not elsewhere classified: Secondary | ICD-10-CM | POA: Diagnosis present

## 2015-01-30 DIAGNOSIS — M25561 Pain in right knee: Secondary | ICD-10-CM

## 2015-01-30 NOTE — Therapy (Signed)
[facility] [address] [place], [place], [address] Phone: [phone]   Fax:  [phone]  Physical Therapy Treatment  Patient Details  Name: Brendan HerterRobert W Gatta MRN: [id number] Date of Birth: 10/19/1970 Referring Provider:  Ernestina PennaMoore, Donald W, MD  Encounter Date: 01/30/2015      PT End of Session - 01/30/15 1046    Visit Number 11   Number of Visits 12   Date for PT Re-Evaluation 01/30/15   PT Start Time 0903   PT Stop Time 0950   PT Time Calculation (min) 47 min   Equipment Utilized During Treatment Right knee immobilizer   Activity Tolerance Patient tolerated treatment well      Past Medical History  Diagnosis Date  . GERD (gastroesophageal reflux disease)   . Complication of anesthesia     years ago used to hyperventilate no recent problems   . Arthritis     Past Surgical History  Procedure Laterality Date  . Orthopedic surgery      left knee quad tendon tear and patella tear   . Back surgery    . Arthroscopy  Bilateral     Knees  . Rotator cuff repair Right   . Cholecystectomy N/A 02/24/2013    Procedure: LAPAROSCOPIC CHOLECYSTECTOMY;  Surgeon: Dalia HeadingMark A Jenkins, MD;  Location: [facility];  Service: General;  Laterality: N/A;  . Quadriceps tendon repair Right 10/18/2014    Procedure: RIGHT QUADRICEP TENDON REPAIR ;  Surgeon: Javier DockerJeffrey C Beane, MD;  [facility];  Service: Orthopedics;  Laterality: Right;    There were no vitals filed for this visit.  Visit Diagnosis:  Right knee pain  Knee stiffness, right      Subjective Assessment - 01/30/15 1047    Subjective Patient reports his MRI is actually this week.                         [facility] Adult PT Treatment/Exercise - 01/30/15 0001    Knee/Hip Exercises: Aerobic   Stationary Bike 15 minutes up level 3   Knee/Hip Exercises: Supine   Short Arc Quad Sets Limitations VMS to right quad---SAQ's x 15 minutes with 10 sec extension holds and 10 sec rest).                      PT Long Term Goals - 01/16/15 1349    PT LONG TERM GOAL #1   Title Ind with HEP.   Period Weeks   Status New   PT LONG TERM GOAL #2   Title Active right knee flexion to 120-125 degrees (per surgeon guidelines).   Time 10   Period Weeks   Status On-going   PT LONG TERM GOAL #3   Title 5/5 right knee strength.   Time 10   Period Weeks   Status On-going   PT LONG TERM GOAL #4   Title Perform ADL's independently with pain not > 2-3/10   Time 8   Period Weeks   Status On-going               Problem List Patient Active Problem List   Diagnosis Date Noted  . Rupture of right quadriceps tendon 10/18/2014  . Tear of tendon of lower extremity 10/18/2014    [name], [name] 01/30/2015, 11:06 AM  [facility] [address] [place], [place], [address] Phone: [phone]   Fax:  [phone]

## 2015-02-06 ENCOUNTER — Encounter: Payer: BLUE CROSS/BLUE SHIELD | Admitting: Physical Therapy

## 2015-02-07 ENCOUNTER — Ambulatory Visit: Payer: BLUE CROSS/BLUE SHIELD | Admitting: Physical Therapy

## 2015-02-07 DIAGNOSIS — M25561 Pain in right knee: Secondary | ICD-10-CM

## 2015-02-07 DIAGNOSIS — M25661 Stiffness of right knee, not elsewhere classified: Secondary | ICD-10-CM

## 2015-02-07 NOTE — Therapy (Signed)
[facility] [address] [place], [place], [address] Phone: [phone]   Fax:  [phone]  Physical Therapy Treatment  Patient Details  Name: Brendan Macdonald MRN: [id number] Date of Birth: 1971/05/31 Referring Provider:  Ernestina Penna, MD  Encounter Date: 02/07/2015      PT End of Session - 02/07/15 1300    Visit Number 12   Number of Visits 12   Date for PT Re-Evaluation 01/30/15   PT Start Time 1115   PT Stop Time 1207   PT Time Calculation (min) 52 min   Equipment Utilized During Treatment Right knee immobilizer   Activity Tolerance Patient tolerated treatment well   Behavior During Therapy [facility] for tasks assessed/performed      Past Medical History  Diagnosis Date  . GERD (gastroesophageal reflux disease)   . Complication of anesthesia     years ago used to hyperventilate no recent problems   . Arthritis     Past Surgical History  Procedure Laterality Date  . Orthopedic surgery      left knee quad tendon tear and patella tear   . Back surgery    . Arthroscopy  Bilateral     Knees  . Rotator cuff repair Right   . Cholecystectomy N/A 02/24/2013    Procedure: LAPAROSCOPIC CHOLECYSTECTOMY;  Surgeon: Dalia Heading, MD;  Location: [facility];  Service: General;  Laterality: N/A;  . Quadriceps tendon repair Right 10/18/2014    Procedure: RIGHT QUADRICEP TENDON REPAIR ;  Surgeon: Javier Docker, MD;  Location: [facility];  Service: Orthopedics;  Laterality: Right;    There were no vitals filed for this visit.  Visit Diagnosis:  Right knee pain - Plan: PT plan of care cert/re-cert  Knee stiffness, right - Plan: PT plan of care cert/re-cert      Subjective Assessment - 02/07/15 1254    Subjective Patient forget updated MD note but states he will bring it in tomorrow.  He states his MRI to his right knee revealed a vertical tear in his patellar tendon and arthritis.  Patient states he may need another knee surgery to "clean it  up."   Pain Score 4    Pain Location Knee   Pain Orientation Right   Pain Descriptors / Indicators Aching   Pain Type Surgical pain   Pain Onset More than a month ago                                      PT Long Term Goals - 02/07/15 1301    PT LONG TERM GOAL #1   Title Ind with HEP.   Period Weeks   Status New   PT LONG TERM GOAL #2   Title Active right knee flexion to 120-125 degrees (per surgeon guidelines).   Time 10   Period Weeks   Status On-going   PT LONG TERM GOAL #3   Title 5/5 right knee strength.   Time 10   Period Weeks   Status On-going   PT LONG TERM GOAL #4   Title Perform ADL's independently with pain not > 2-3/10   Time 8   Period Weeks   Status On-going    Treatment:  Bike level 3 x 15 minutes  VMS x 15 minutes to patients right quadriceps with patient performing SAQ's (10 second extension holds----10 second rest).  IASTM x 10 minutes to patient's  right patellar tendon.    Patient tolerated treatment well.          Problem List Patient Active Problem List   Diagnosis Date Noted  . Rupture of right quadriceps tendon 10/18/2014  . Tear of tendon of lower extremity 10/18/2014    Jersey Espinoza, [place] MPT 02/07/2015, 1:09 PM  [facility] [address] [place], [place], [address] Phone: [phone]   Fax:  [phone]

## 2015-02-13 ENCOUNTER — Ambulatory Visit: Payer: BLUE CROSS/BLUE SHIELD | Admitting: Physical Therapy

## 2015-02-13 DIAGNOSIS — M25661 Stiffness of right knee, not elsewhere classified: Secondary | ICD-10-CM

## 2015-02-13 DIAGNOSIS — M25561 Pain in right knee: Secondary | ICD-10-CM

## 2015-02-13 NOTE — Therapy (Signed)
[facility] [address] [place], [place], [address] Phone: [phone]   Fax:  [phone]  Physical Therapy Treatment  Patient Details  Name: Brendan Macdonald MRN: [id number] Date of Birth: 09-14-1970 Referring Provider:  Ernestina Penna, MD  Encounter Date: 02/13/2015      PT End of Session - 02/13/15 1218    Visit Number 13   Number of Visits 24   Date for PT Re-Evaluation 03/27/15   PT Start Time 1115   PT Stop Time 1201   PT Time Calculation (min) 46 min   Activity Tolerance Patient tolerated treatment well   Behavior During Therapy [facility] for tasks assessed/performed      Past Medical History  Diagnosis Date  . GERD (gastroesophageal reflux disease)   . Complication of anesthesia     years ago used to hyperventilate no recent problems   . Arthritis     Past Surgical History  Procedure Laterality Date  . Orthopedic surgery      left knee quad tendon tear and patella tear   . Back surgery    . Arthroscopy  Bilateral     Knees  . Rotator cuff repair Right   . Cholecystectomy N/A 02/24/2013    Procedure: LAPAROSCOPIC CHOLECYSTECTOMY;  Surgeon: Dalia Heading, MD;  Location: [facility];  Service: General;  Laterality: N/A;  . Quadriceps tendon repair Right 10/18/2014    Procedure: RIGHT QUADRICEP TENDON REPAIR ;  Surgeon: Javier Docker, MD;  [facility];  Service: Orthopedics;  Laterality: Right;    There were no vitals filed for this visit.  Visit Diagnosis:  Right knee pain  Knee stiffness, right      Subjective Assessment - 02/13/15 1214    Subjective The patellar tendon has been bothering me quite a bit the last few days.   Limitations Standing;Walking   How long can you walk comfortably? 1/4 block   Patient Stated Goals Return to work.   Pain Score 5    Pain Location Knee   Pain Orientation Right   Pain Descriptors / Indicators Aching   Pain Type Surgical pain   Pain Onset More than a month ago   Aggravating Factors  Increased walking.   Multiple Pain Sites No   Multiple Pain Sites No                         [facility] Adult PT Treatment/Exercise - 02/13/15 0001    Knee/Hip Exercises: Aerobic   Stationary Bike --  15 minutes level 3.   Modalities   Modalities Ultrasound   Ultrasound   Ultrasound Location 1.50 W/cm2 at 50% at 3.3 Mhz to patient's right patellar tendon x 10 minutes.   Manual Therapy   Manual therapy comments IASTM x 13 minutes to patient's right patellar tendon.                     PT Long Term Goals - 02/07/15 1301    PT LONG TERM GOAL #1   Title Ind with HEP.   Period Weeks   Status New   PT LONG TERM GOAL #2   Title Active right knee flexion to 120-125 degrees (per surgeon guidelines).   Time 10   Period Weeks   Status On-going   PT LONG TERM GOAL #3   Title 5/5 right knee strength.   Time 10   Period Weeks   Status On-going   PT LONG  TERM GOAL #4   Title Perform ADL's independently with pain not > 2-3/10   Time 8   Period Weeks   Status On-going               Problem List Patient Active Problem List   Diagnosis Date Noted  . Rupture of right quadriceps tendon 10/18/2014  . Tear of tendon of lower extremity 10/18/2014    APPLEGATE, [place] MPT 02/13/2015, 12:19 PM  [facility] [address] [place], [place], [address] Phone: [phone]   Fax:  [phone]

## 2015-02-27 ENCOUNTER — Encounter: Payer: BLUE CROSS/BLUE SHIELD | Admitting: Physical Therapy

## 2015-03-02 ENCOUNTER — Encounter: Payer: BLUE CROSS/BLUE SHIELD | Admitting: Physical Therapy

## 2015-03-07 ENCOUNTER — Ambulatory Visit: Payer: Self-pay | Admitting: Orthopedic Surgery

## 2015-03-07 ENCOUNTER — Encounter: Payer: BLUE CROSS/BLUE SHIELD | Admitting: Physical Therapy

## 2015-03-12 ENCOUNTER — Ambulatory Visit: Payer: Self-pay | Admitting: Orthopedic Surgery

## 2015-03-12 NOTE — H&P (Signed)
Brendan HerterRobert W Macdonald is an 44 y.o. male.   Chief Complaint: R knee pain HPI: The patient is a 44 year old male who presents today for follow up of their knee. The patient is being followed for their right quadriceps tendon rupture. They are now 4 1/2 months out from surgery. Symptoms reported today include: pain, swelling and instability. Current treatment includes: physical therapy, home exercise program, bracing and activity modification. The following medication has been used for pain control: none. The patient presents today following physical therapy. The patient has reported improvement of their symptoms with: Cortisone injections (helped very little). Note for "Follow-up Knee": Brendan Macdonald follows up for his right knee. He reports the cortisone injection from last visit helped for about 2 days then the knee started hurting again and has been about the same since then. He's continued working with PT, feels he has reached a limit in his flexion. He is able to fully extend the knee but notes anterior knee pain when doing so. He reports ongoing catching, giving way, instability, popping, crunching and grinding. He describes is as broken glass inside the knee. He notes the most sharp pain on the medial side of the knee. The incision itself is still sensitive. He's wearing his hinged brace for support. He is out of work.  Past Medical History  Diagnosis Date  . GERD (gastroesophageal reflux disease)   . Complication of anesthesia     years ago used to hyperventilate no recent problems   . Arthritis     Past Surgical History  Procedure Laterality Date  . Orthopedic surgery      left knee quad tendon tear and patella tear   . Back surgery    . Arthroscopy  Bilateral     Knees  . Rotator cuff repair Right   . Cholecystectomy N/A 02/24/2013    Procedure: LAPAROSCOPIC CHOLECYSTECTOMY;  Surgeon: Dalia HeadingMark A Jenkins, MD;  Location: [facility];  Service: General;  Laterality: N/A;  . Quadriceps tendon repair Right  10/18/2014    Procedure: RIGHT QUADRICEP TENDON REPAIR ;  Surgeon: Javier DockerJeffrey C Beane, MD;  Location: [facility];  Service: Orthopedics;  Laterality: Right;    Family History  Problem Relation Age of Onset  . Cancer Mother   . Diabetes Mother   . Heart disease Father   . Hypertension Father    Social History:  reports that he quit smoking about 4 years ago. His smokeless tobacco use includes Snuff. He reports that he drinks alcohol. He reports that he does not use illicit drugs.  Allergies:  Allergies  Allergen Reactions  . Oxycodone Shortness Of Breath and Other (See Comments)    Hallucinations  . Vicodin [Hydrocodone-Acetaminophen] Shortness Of Breath and Other (See Comments)    Hallucinations     (Not in a hospital admission)  No results found for this or any previous visit (from the past 48 hour(s)). No results found.  Review of Systems  Constitutional: Negative.   HENT: Negative.   Eyes: Negative.   Respiratory: Negative.   Cardiovascular: Negative.   Gastrointestinal: Negative.   Genitourinary: Negative.   Musculoskeletal: Positive for joint pain.  Skin: Negative.   Neurological: Negative.   Psychiatric/Behavioral: Negative.     There were no vitals taken for this visit. Physical Exam  Constitutional: He is oriented to person, place, and time. He appears well-developed and well-nourished.  HENT:  Head: Normocephalic.  Eyes: Pupils are equal, round, and reactive to light.  Neck: Normal range of motion.  Cardiovascular: Normal rate.   Respiratory: Effort normal.  GI: Soft.  Musculoskeletal:  Right Lower Extremity: Right Knee: Inspection and Palpation - Tenderness - quadriceps tender to palpation, patellar tendon tender to palpation and medial joint line tender to palpation, no tenderness to palpation of the superior calf, no tenderness to palpation of the quadriceps tendon, no tenderness to palpation of the patella, no tenderness to palpation of the lateral joint  line, no tenderness to palpation of the fibular head, no tenderness to palpation of the peroneal nerve. Note: mild incisional tenderness. Swelling - mild. Effusion - trace. Tissue tension/texture is - soft. Crepitus - mild patellofemoral crepitus. Pulses - 2+. Sensation - intact to light touch. Skin - Color - no ecchymosis, no erythema. Wound/Incision - Appearance - The incision is well healed with no erythema or exudates. Swelling - there is no swelling around the incision. Healing - The incision is well healed and healing as expected. ROM: Flexion - AROM - 100 . Extension - PROM - 0 . Right Knee - Deformities/Malalignments/Discrepancies - no deformities noted.  Neurological: He is alert and oriented to person, place, and time. He has normal reflexes.  Skin: Skin is warm and dry.    Prior MRI reviewed at last visit with severe patellar tendon with interstitial split tears, quad tendinosis with possible split tear. No meniscal tear noted. Effusion, evidence of prior Osgood Schlatter's.  Assessment/Plan R knee arthrofibrosis, partial tear patellar and quad tendons  Pt 4.5 months out from R quad tendon repair with ongoing stiffness (flexion to 100 degrees), pain, instability, catching and crepitus due to scar tissue and possible occult meniscal tear or chondral flap tear, refractory to bracing, PT, HEP, activity modifications, quad strengthening, medications. We discussed etiology of his symptoms, as well as his underlying DJD. Discussed tx options at this point. He only had 2 days of relief from intra-articular steroid injection which confirms intrinsic knee etiology but would not recommend repeating the injection at this point due to limited duration of relief. Given his ongoing symptoms at this point it is reasonable to consider knee arthroscopy which was discussed at last visit. At this point, recommend proceeding with right knee evaluation and manipulation under anesthesia, arthroscopy, debridement,  lysis of scar tissue, possible repair of patellar or quad tendon. Discussed the procedure itself as well as risks, complications and alternatives, including but not limited to DVT, PE, infx, bleeding, failure of procedure, need for secondary procedure, ongoing pain/symptoms, anesthesia risk, even stroke or death. Also discussed typical post-op protocols, time out of work, ice and elevation, HEP/quad strengthening, possible formal PT. Discussed need for DVT ppx post-op with ASA per protocol. We discussed the possibility of worsening pain and stiffness post-operatively due to DJD and the possible need for future steroid injections, viscosupplementation, repeat arthroscopy, even TKA. All questions were answered. Patient desires to proceed with surgery. We discussed possible need for tendon repair if necessary, which is unlikely given the appearance on MRI. Discussed time out of work, returning to PT immediately after surgery to continue to work on ROM and strength. In the interim, he will continue with HEP, hinged brace, ice, elevation, NSAIDs prn, and activity modifications. He will remain out of work. He will follow up 10-14 days post-op for suture removal and call with any questions or concerns in the interim.  Right knee exam and manipulation under anesthesia, arthroscopy, debridement, lysis of scar tissue, possible repair patellar and quadricep tendons  Brendan Macdonald M. PA-C for Dr. Beane  03/12/2015, 8:29 AM    

## 2015-03-21 ENCOUNTER — Encounter (HOSPITAL_COMMUNITY): Payer: Self-pay

## 2015-03-21 ENCOUNTER — Encounter (HOSPITAL_COMMUNITY)
Admission: RE | Admit: 2015-03-21 | Discharge: 2015-03-21 | Disposition: A | Discharge status: Home / Self Care | Payer: BLUE CROSS/BLUE SHIELD | Source: Ambulatory Visit | Attending: Specialist | Admitting: Specialist

## 2015-03-21 DIAGNOSIS — X58XXXA Exposure to other specified factors, initial encounter: Secondary | ICD-10-CM | POA: Diagnosis not present

## 2015-03-21 DIAGNOSIS — S83511A Sprain of anterior cruciate ligament of right knee, initial encounter: Secondary | ICD-10-CM | POA: Diagnosis not present

## 2015-03-21 DIAGNOSIS — M24661 Ankylosis, right knee: Secondary | ICD-10-CM | POA: Diagnosis not present

## 2015-03-21 DIAGNOSIS — Z87891 Personal history of nicotine dependence: Secondary | ICD-10-CM | POA: Diagnosis not present

## 2015-03-21 DIAGNOSIS — M94261 Chondromalacia, right knee: Secondary | ICD-10-CM | POA: Diagnosis not present

## 2015-03-21 DIAGNOSIS — M1990 Unspecified osteoarthritis, unspecified site: Secondary | ICD-10-CM | POA: Diagnosis not present

## 2015-03-21 DIAGNOSIS — M25561 Pain in right knee: Secondary | ICD-10-CM | POA: Diagnosis present

## 2015-03-21 DIAGNOSIS — E669 Obesity, unspecified: Secondary | ICD-10-CM | POA: Diagnosis not present

## 2015-03-21 DIAGNOSIS — Z6834 Body mass index (BMI) 34.0-34.9, adult: Secondary | ICD-10-CM | POA: Diagnosis not present

## 2015-03-21 DIAGNOSIS — K219 Gastro-esophageal reflux disease without esophagitis: Secondary | ICD-10-CM | POA: Diagnosis not present

## 2015-03-21 DIAGNOSIS — M24561 Contracture, right knee: Secondary | ICD-10-CM | POA: Diagnosis not present

## 2015-03-21 HISTORY — DX: Personal history of other infectious and parasitic diseases: Z86.19

## 2015-03-21 LAB — CBC
HCT: 37.6 % — ABNORMAL LOW (ref 39.0–52.0)
HEMOGLOBIN: 12.8 g/dL — AB (ref 13.0–17.0)
MCH: 27.6 pg (ref 26.0–34.0)
MCHC: 34 g/dL (ref 30.0–36.0)
MCV: 81 fL (ref 78.0–100.0)
Platelets: 225 10*3/uL (ref 150–400)
RBC: 4.64 MIL/uL (ref 4.22–5.81)
RDW: 13.1 % (ref 11.5–15.5)
WBC: 6.7 10*3/uL (ref 4.0–10.5)

## 2015-03-21 LAB — BASIC METABOLIC PANEL
Anion gap: 10 (ref 5–15)
BUN: 12 mg/dL (ref 6–20)
CO2: 25 mmol/L (ref 22–32)
CREATININE: 1.11 mg/dL (ref 0.61–1.24)
Calcium: 9.2 mg/dL (ref 8.9–10.3)
Chloride: 105 mmol/L (ref 101–111)
GFR calc Af Amer: >60 mL/min (ref >60)
Glucose, Bld: 108 mg/dL — ABNORMAL HIGH (ref 65–99)
Potassium: 3.3 mmol/L — ABNORMAL LOW (ref 3.5–5.1)
SODIUM: 140 mmol/L (ref 135–145)

## 2015-03-21 NOTE — Patient Instructions (Signed)
Brendan Macdonald  03/21/2015   Your procedure is scheduled on: Friday March 23, 2015  Report to [facility]  Entrance take [place]  elevators to 3rd floor to  Short Stay Center at 12:30 PM.  Call this number if you have problems the morning of surgery [phone]   Remember: ONLY 1 PERSON MAY GO WITH YOU TO SHORT STAY TO GET  READY MORNING OF YOUR SURGERY.  Do not eat food After Midnight but may take clear liquids till 8:30 am day of surgery then nothing by mouth.      Take these medicines the morning of surgery with A SIP OF WATER: Ranitidine (Zantac); Flonase if needed                               You may not have any metal on your body including hair pins and              piercings  Do not wear jewelry, lotions, powders or colognes, deodorant             Men may shave face and neck.   Do not bring valuables to the hospital. [facility] IS NOT             RESPONSIBLE   FOR VALUABLES.  Contacts, dentures or bridgework may not be worn into surgery.      Patients discharged the day of surgery will not be allowed to drive home.  Name and phone number of your driver:[name] (wife)               Please read over the following fact sheets you were given:INCENTIVE SPIROMETER  _____________________________________________________________________             [facility] - Preparing for Surgery Before surgery, you can play an important role.  Because skin is not sterile, your skin needs to be as free of germs as possible.  You can reduce the number of germs on your skin by washing with CHG (chlorahexidine gluconate) soap before surgery.  CHG is an antiseptic cleaner which kills germs and bonds with the skin to continue killing germs even after washing. Please DO NOT use if you have an allergy to CHG or antibacterial soaps.  If your skin becomes reddened/irritated stop using the CHG and inform your nurse when you arrive at Short Stay. Do not shave (including  legs and underarms) for at least 48 hours prior to the first CHG shower.  You may shave your face/neck. Please follow these instructions carefully:  1.  Shower with CHG Soap the night before surgery and the  morning of Surgery.  2.  If you choose to wash your hair, wash your hair first as usual with your  normal  shampoo.  3.  After you shampoo, rinse your hair and body thoroughly to remove the  shampoo.                           4.  Use CHG as you would any other liquid soap.  You can apply chg directly  to the skin and wash                       Gently with a scrungie or clean washcloth.  5.  Apply the CHG Soap  to your body ONLY FROM THE NECK DOWN.   Do not use on face/ open                           Wound or open sores. Avoid contact with eyes, ears mouth and genitals (private parts).                       Wash face,  Genitals (private parts) with your normal soap.             6.  Wash thoroughly, paying special attention to the area where your surgery  will be performed.  7.  Thoroughly rinse your body with warm water from the neck down.  8.  DO NOT shower/wash with your normal soap after using and rinsing off  the CHG Soap.                9.  Pat yourself dry with a clean towel.            10.  Wear clean pajamas.            11.  Place clean sheets on your bed the night of your first shower and do not  sleep with pets. Day of Surgery : Do not apply any lotions/deodorants the morning of surgery.  Please wear clean clothes to the hospital/surgery center.  FAILURE TO FOLLOW THESE INSTRUCTIONS MAY RESULT IN THE CANCELLATION OF YOUR SURGERY PATIENT SIGNATURE_________________________________  NURSE SIGNATURE__________________________________  ________________________________________________________________________    CLEAR LIQUID DIET   Foods Allowed                                                                     Foods Excluded  Coffee and tea, regular and decaf                              liquids that you cannot  Plain Jell-O in any flavor                                             see through such as: Fruit ices (not with fruit pulp)                                     milk, soups, orange juice  Iced Popsicles                                    All solid food Carbonated beverages, regular and diet                                    Cranberry, grape and apple juices Sports drinks like Gatorade Lightly seasoned clear broth or consume(fat free) Sugar, honey syrup  Sample Menu Breakfast  Lunch                                     Supper Cranberry juice                    Beef broth                            Chicken broth Jell-O                                     Grape juice                           Apple juice Coffee or tea                        Jell-O                                      Popsicle                                                Coffee or tea                        Coffee or tea  _____________________________________________________________________    Incentive Spirometer  An incentive spirometer is a tool that can help keep your lungs clear and active. This tool measures how well you are filling your lungs with each breath. Taking long deep breaths may help reverse or decrease the chance of developing breathing (pulmonary) problems (especially infection) following:  A long period of time when you are unable to move or be active. BEFORE THE PROCEDURE   If the spirometer includes an indicator to show your best effort, your nurse or respiratory therapist will set it to a desired goal.  If possible, sit up straight or lean slightly forward. Try not to slouch.  Hold the incentive spirometer in an upright position. INSTRUCTIONS FOR USE  1. Sit on the edge of your bed if possible, or sit up as far as you can in bed or on a chair. 2. Hold the incentive spirometer in an upright position. 3. Breathe out  normally. 4. Place the mouthpiece in your mouth and seal your lips tightly around it. 5. Breathe in slowly and as deeply as possible, raising the piston or the ball toward the top of the column. 6. Hold your breath for 3-5 seconds or for as long as possible. Allow the piston or ball to fall to the bottom of the column. 7. Remove the mouthpiece from your mouth and breathe out normally. 8. Rest for a few seconds and repeat Steps 1 through 7 at least 10 times every 1-2 hours when you are awake. Take your time and take a few normal breaths between deep breaths. 9. The spirometer may include an indicator to show your best effort. Use the indicator as a goal to work toward during each repetition. 10. After each set of 10 deep breaths,  practice coughing to be sure your lungs are clear. If you have an incision (the cut made at the time of surgery), support your incision when coughing by placing a pillow or rolled up towels firmly against it. Once you are able to get out of bed, walk around indoors and cough well. You may stop using the incentive spirometer when instructed by your caregiver.  RISKS AND COMPLICATIONS  Take your time so you do not get dizzy or light-headed.  If you are in pain, you may need to take or ask for pain medication before doing incentive spirometry. It is harder to take a deep breath if you are having pain. AFTER USE  Rest and breathe slowly and easily.  It can be helpful to keep track of a log of your progress. Your caregiver can provide you with a simple table to help with this. If you are using the spirometer at home, follow these instructions: SEEK MEDICAL CARE IF:   You are having difficultly using the spirometer.  You have trouble using the spirometer as often as instructed.  Your pain medication is not giving enough relief while using the spirometer.  You develop fever of 100.5 F (38.1 C) or higher. SEEK IMMEDIATE MEDICAL CARE IF:   You cough up bloody sputum  that had not been present before.  You develop fever of 102 F (38.9 C) or greater.  You develop worsening pain at or near the incision site. MAKE SURE YOU:   Understand these instructions.  Will watch your condition.  Will get help right away if you are not doing well or get worse. Document Released: 12/22/2006 Document Revised: 11/03/2011 Document Reviewed: 02/22/2007 [facility] Patient Information 2014 [place], [place].   ________________________________________________________________________

## 2015-03-22 MED ORDER — DEXTROSE 5 % IV SOLN
3.0000 g | INTRAVENOUS | Status: AC
  Administered 2015-03-23: 3 g via INTRAVENOUS
  Filled 2015-03-22 (×2): qty 3000

## 2015-03-23 ENCOUNTER — Encounter (HOSPITAL_COMMUNITY): Payer: Self-pay | Admitting: *Deleted

## 2015-03-23 ENCOUNTER — Encounter (HOSPITAL_COMMUNITY)
Admission: RE | Disposition: A | Discharge status: Home / Self Care | Payer: Self-pay | Source: Ambulatory Visit | Attending: Specialist

## 2015-03-23 ENCOUNTER — Ambulatory Visit (HOSPITAL_COMMUNITY): Payer: BLUE CROSS/BLUE SHIELD | Admitting: Anesthesiology

## 2015-03-23 ENCOUNTER — Ambulatory Visit (HOSPITAL_COMMUNITY)
Admission: RE | Admit: 2015-03-23 | Discharge: 2015-03-23 | Disposition: A | Discharge status: Home / Self Care | Payer: BLUE CROSS/BLUE SHIELD | Source: Ambulatory Visit | Attending: Specialist | Admitting: Specialist

## 2015-03-23 DIAGNOSIS — S83511A Sprain of anterior cruciate ligament of right knee, initial encounter: Secondary | ICD-10-CM | POA: Insufficient documentation

## 2015-03-23 DIAGNOSIS — K219 Gastro-esophageal reflux disease without esophagitis: Secondary | ICD-10-CM | POA: Insufficient documentation

## 2015-03-23 DIAGNOSIS — M24661 Ankylosis, right knee: Secondary | ICD-10-CM | POA: Insufficient documentation

## 2015-03-23 DIAGNOSIS — M94261 Chondromalacia, right knee: Secondary | ICD-10-CM | POA: Insufficient documentation

## 2015-03-23 DIAGNOSIS — M1990 Unspecified osteoarthritis, unspecified site: Secondary | ICD-10-CM | POA: Insufficient documentation

## 2015-03-23 DIAGNOSIS — E669 Obesity, unspecified: Secondary | ICD-10-CM | POA: Insufficient documentation

## 2015-03-23 DIAGNOSIS — X58XXXA Exposure to other specified factors, initial encounter: Secondary | ICD-10-CM | POA: Insufficient documentation

## 2015-03-23 DIAGNOSIS — Z87891 Personal history of nicotine dependence: Secondary | ICD-10-CM | POA: Insufficient documentation

## 2015-03-23 DIAGNOSIS — M24561 Contracture, right knee: Secondary | ICD-10-CM | POA: Insufficient documentation

## 2015-03-23 DIAGNOSIS — Z6834 Body mass index (BMI) 34.0-34.9, adult: Secondary | ICD-10-CM | POA: Insufficient documentation

## 2015-03-23 HISTORY — PX: KNEE ARTHROSCOPY WITH PATELLAR TENDON REPAIR: SHX5656

## 2015-03-23 HISTORY — PX: EXAM UNDER ANESTHESIA WITH MANIPULATION OF KNEE: SHX5816

## 2015-03-23 HISTORY — PX: LYSIS OF ADHESION: SHX5961

## 2015-03-23 SURGERY — KNEE ARTHROSCOPY WITH PATELLAR TENDON REPAIR
Anesthesia: General | Site: Knee | Laterality: Right

## 2015-03-23 MED ORDER — DOCUSATE SODIUM 100 MG PO CAPS: 100.0000 mg | ORAL_CAPSULE | Freq: Two times a day (BID) | ORAL | Status: DC | PRN

## 2015-03-23 MED ORDER — FENTANYL CITRATE (PF) 100 MCG/2ML IJ SOLN
INTRAMUSCULAR | Status: DC | PRN
  Administered 2015-03-23 (×2): 50 ug via INTRAVENOUS
  Administered 2015-03-23: 100 ug via INTRAVENOUS

## 2015-03-23 MED ORDER — MIDAZOLAM HCL 2 MG/2ML IJ SOLN
INTRAMUSCULAR | Status: AC
  Filled 2015-03-23: qty 2

## 2015-03-23 MED ORDER — METHOCARBAMOL 500 MG PO TABS
500.0000 mg | ORAL_TABLET | Freq: Once | ORAL | Status: AC
  Administered 2015-03-23: 500 mg via ORAL
  Filled 2015-03-23: qty 1

## 2015-03-23 MED ORDER — METHOCARBAMOL 500 MG PO TABS: 500.0000 mg | ORAL_TABLET | Freq: Three times a day (TID) | ORAL | Status: DC

## 2015-03-23 MED ORDER — FENTANYL CITRATE (PF) 100 MCG/2ML IJ SOLN: 25.0000 ug | INTRAMUSCULAR | Status: DC | PRN

## 2015-03-23 MED ORDER — LACTATED RINGERS IR SOLN
Status: DC | PRN
  Administered 2015-03-23: 3000 mL

## 2015-03-23 MED ORDER — LIDOCAINE HCL (CARDIAC) 20 MG/ML IV SOLN
INTRAVENOUS | Status: DC | PRN
  Administered 2015-03-23: 100 mg via INTRAVENOUS

## 2015-03-23 MED ORDER — GLYCOPYRROLATE 0.2 MG/ML IJ SOLN
INTRAMUSCULAR | Status: DC | PRN
  Administered 2015-03-23: 0.2 mg via INTRAVENOUS

## 2015-03-23 MED ORDER — FENTANYL CITRATE (PF) 100 MCG/2ML IJ SOLN
INTRAMUSCULAR | Status: AC
  Filled 2015-03-23: qty 2

## 2015-03-23 MED ORDER — PROPOFOL 10 MG/ML IV BOLUS
INTRAVENOUS | Status: DC | PRN
  Administered 2015-03-23: 250 mg via INTRAVENOUS

## 2015-03-23 MED ORDER — DEXAMETHASONE SODIUM PHOSPHATE 10 MG/ML IJ SOLN
INTRAMUSCULAR | Status: DC | PRN
  Administered 2015-03-23: 10 mg via INTRAVENOUS

## 2015-03-23 MED ORDER — LACTATED RINGERS IV SOLN
INTRAVENOUS | Status: DC
  Administered 2015-03-23: 1000 mL via INTRAVENOUS

## 2015-03-23 MED ORDER — PROMETHAZINE HCL 25 MG/ML IJ SOLN: 6.2500 mg | INTRAMUSCULAR | Status: DC | PRN

## 2015-03-23 MED ORDER — HYDROMORPHONE HCL 2 MG PO TABS
4.0000 mg | ORAL_TABLET | Freq: Once | ORAL | Status: AC
  Administered 2015-03-23: 4 mg via ORAL
  Filled 2015-03-23: qty 2

## 2015-03-23 MED ORDER — MIDAZOLAM HCL 5 MG/5ML IJ SOLN
INTRAMUSCULAR | Status: DC | PRN
  Administered 2015-03-23: 2 mg via INTRAVENOUS

## 2015-03-23 MED ORDER — ONDANSETRON HCL 4 MG/2ML IJ SOLN
INTRAMUSCULAR | Status: AC
  Filled 2015-03-23: qty 2

## 2015-03-23 MED ORDER — BUPIVACAINE-EPINEPHRINE (PF) 0.5% -1:200000 IJ SOLN
INTRAMUSCULAR | Status: AC
  Filled 2015-03-23: qty 30

## 2015-03-23 MED ORDER — EPINEPHRINE HCL 1 MG/ML IJ SOLN
INTRAMUSCULAR | Status: DC | PRN
Start: 2015-03-23 — End: 2015-03-23
  Administered 2015-03-23: 1 mg

## 2015-03-23 MED ORDER — DEXAMETHASONE SODIUM PHOSPHATE 10 MG/ML IJ SOLN
INTRAMUSCULAR | Status: AC
Start: 2015-03-23 — End: 2015-03-23
  Filled 2015-03-23: qty 1

## 2015-03-23 MED ORDER — EPINEPHRINE HCL 1 MG/ML IJ SOLN
INTRAMUSCULAR | Status: AC
  Filled 2015-03-23: qty 2

## 2015-03-23 MED ORDER — BUPIVACAINE-EPINEPHRINE 0.5% -1:200000 IJ SOLN
INTRAMUSCULAR | Status: DC | PRN
  Administered 2015-03-23: 20 mL

## 2015-03-23 MED ORDER — PROPOFOL 10 MG/ML IV BOLUS
INTRAVENOUS | Status: AC
  Filled 2015-03-23: qty 20

## 2015-03-23 MED ORDER — ONDANSETRON HCL 4 MG/2ML IJ SOLN
INTRAMUSCULAR | Status: DC | PRN
  Administered 2015-03-23: 4 mg via INTRAVENOUS

## 2015-03-23 MED ORDER — LIDOCAINE HCL (CARDIAC) 20 MG/ML IV SOLN
INTRAVENOUS | Status: AC
  Filled 2015-03-23: qty 5

## 2015-03-23 MED ORDER — HYDROMORPHONE HCL 4 MG PO TABS: 4.0000 mg | ORAL_TABLET | ORAL | Status: DC | PRN

## 2015-03-23 SURGICAL SUPPLY — 65 items
BAG ZIPLOCK 12X15 (MISCELLANEOUS) IMPLANT
BANDAGE ELASTIC 6 VELCRO ST LF (GAUZE/BANDAGES/DRESSINGS) ×2 IMPLANT
BANDAGE ESMARK 6X9 LF (GAUZE/BANDAGES/DRESSINGS) ×1 IMPLANT
BIT DRILL 2.4X128 (BIT) IMPLANT
BIT DRILL 2.8X128 (BIT) IMPLANT
BLADE 4.2CUDA (BLADE) IMPLANT
BLADE CUDA SHAVER 3.5 (BLADE) ×2 IMPLANT
BNDG ELASTIC 6X10 VLCR STRL LF (GAUZE/BANDAGES/DRESSINGS) ×2 IMPLANT
BNDG ESMARK 6X9 LF (GAUZE/BANDAGES/DRESSINGS) ×2
BOOTIES KNEE HIGH SLOAN (MISCELLANEOUS) ×2 IMPLANT
CLOTH 2% CHLOROHEXIDINE 3PK (PERSONAL CARE ITEMS) ×2 IMPLANT
CUFF TOURN SGL QUICK 34 (TOURNIQUET CUFF) ×1
CUFF TRNQT CYL 34X4X40X1 (TOURNIQUET CUFF) ×1 IMPLANT
DRAPE ORTHO SPLIT 77X108 STRL (DRAPES) ×2
DRAPE POUCH INSTRU U-SHP 10X18 (DRAPES) ×2 IMPLANT
DRAPE SHEET LG 3/4 BI-LAMINATE (DRAPES) IMPLANT
DRAPE SURG ORHT 6 SPLT 77X108 (DRAPES) ×2 IMPLANT
DRAPE U-SHAPE 47X51 STRL (DRAPES) ×2 IMPLANT
DRSG ADAPTIC 3X8 NADH LF (GAUZE/BANDAGES/DRESSINGS) IMPLANT
DRSG AQUACEL AG ADV 3.5X10 (GAUZE/BANDAGES/DRESSINGS) IMPLANT
DRSG EMULSION OIL 3X3 NADH (GAUZE/BANDAGES/DRESSINGS) ×2 IMPLANT
DRSG PAD ABDOMINAL 8X10 ST (GAUZE/BANDAGES/DRESSINGS) ×2 IMPLANT
DURAPREP 26ML APPLICATOR (WOUND CARE) ×2 IMPLANT
ELECT REM PT RETURN 9FT ADLT (ELECTROSURGICAL) ×2
ELECTRODE REM PT RTRN 9FT ADLT (ELECTROSURGICAL) ×1 IMPLANT
GAUZE SPONGE 4X4 12PLY STRL (GAUZE/BANDAGES/DRESSINGS) ×2 IMPLANT
GLOVE BIOGEL PI IND STRL 7.5 (GLOVE) ×1 IMPLANT
GLOVE BIOGEL PI IND STRL 8 (GLOVE) ×1 IMPLANT
GLOVE BIOGEL PI INDICATOR 7.5 (GLOVE) ×1
GLOVE BIOGEL PI INDICATOR 8 (GLOVE) ×1
GLOVE SURG SS PI 7.5 STRL IVOR (GLOVE) ×2 IMPLANT
GLOVE SURG SS PI 8.0 STRL IVOR (GLOVE) ×2 IMPLANT
GOWN STRL REUS W/TWL XL LVL3 (GOWN DISPOSABLE) ×4 IMPLANT
IMMOBILIZER KNEE 20 (SOFTGOODS) ×2
IMMOBILIZER KNEE 20 THIGH 36 (SOFTGOODS) ×1 IMPLANT
KIT BASIN OR (CUSTOM PROCEDURE TRAY) ×2 IMPLANT
MANIFOLD NEPTUNE II (INSTRUMENTS) ×2 IMPLANT
NEEDLE HYPO 22GX1.5 SAFETY (NEEDLE) ×2 IMPLANT
NEEDLE MA TROC 1/2 CIR (NEEDLE) IMPLANT
NEEDLE MAYO .5 CIRCLE (NEEDLE) IMPLANT
PACK ARTHROSCOPY WL (CUSTOM PROCEDURE TRAY) ×2 IMPLANT
PACK TOTAL JOINT (CUSTOM PROCEDURE TRAY) ×2 IMPLANT
PAD ABD 8X10 STRL (GAUZE/BANDAGES/DRESSINGS) ×2 IMPLANT
PADDING CAST COTTON 6X4 STRL (CAST SUPPLIES) ×2 IMPLANT
PASSER SUT SWANSON 36MM LOOP (INSTRUMENTS) IMPLANT
POSITIONER SURGICAL ARM (MISCELLANEOUS) ×2 IMPLANT
SET ARTHROSCOPY TUBING (MISCELLANEOUS) ×1
SET ARTHROSCOPY TUBING LN (MISCELLANEOUS) ×1 IMPLANT
STAPLER VISISTAT (STAPLE) ×2 IMPLANT
SUT ETHIBOND 5 LR DA (SUTURE) IMPLANT
SUT ETHIBOND NAB CT1 #1 30IN (SUTURE) IMPLANT
SUT ETHILON 4 0 PS 2 18 (SUTURE) ×2 IMPLANT
SUT FIBERWIRE #2 38 T-5 BLUE (SUTURE) ×4
SUT VIC AB 0 CT1 27 (SUTURE)
SUT VIC AB 0 CT1 27XBRD ANTBC (SUTURE) IMPLANT
SUT VIC AB 1 CT1 27 (SUTURE) ×2
SUT VIC AB 1 CT1 27XBRD ANTBC (SUTURE) ×2 IMPLANT
SUT VIC AB 2-0 CT1 27 (SUTURE) ×2
SUT VIC AB 2-0 CT1 TAPERPNT 27 (SUTURE) ×2 IMPLANT
SUTURE FIBERWR #2 38 T-5 BLUE (SUTURE) ×2 IMPLANT
SYRINGE 20CC LL (MISCELLANEOUS) ×2 IMPLANT
TOWEL OR 17X26 10 PK STRL BLUE (TOWEL DISPOSABLE) ×2 IMPLANT
TOWEL OR NON WOVEN STRL DISP B (DISPOSABLE) IMPLANT
WAND 90 DEG TURBOVAC W/CORD (SURGICAL WAND) IMPLANT
WRAP KNEE MAXI GEL POST OP (GAUZE/BANDAGES/DRESSINGS) ×2 IMPLANT

## 2015-03-23 NOTE — Transfer of Care (Signed)
Immediate Anesthesia Transfer of Care Note  Patient: Brendan Macdonald  Procedure(s) Performed: Procedure(s): RIGHT KNEE ARTHROSCOPY WITH DEBRIDEMENT, POSSIBLE REPAIR PATELLA  AND QUADRICEP TENDON  (Right) RIGHT EXAM UNDER ANESTHESIA WITH MANIPULATION UNDER ANESTHESIA (Right) LYSIS OF SCAR TISSUE (Right)  Patient Location: PACU  Anesthesia Type:General  Level of Consciousness: awake, alert  and oriented  Airway & Oxygen Therapy: Patient Spontanous Breathing and Patient connected to face mask oxygen  Post-op Assessment: Report given to RN and Post -op Vital signs reviewed and stable  Post vital signs: Reviewed and stable  Last Vitals:  Filed Vitals:   03/23/15 1234  BP: 124/90  Pulse: 78  Temp: 36.3 C  Resp: 18    Complications: No apparent anesthesia complications

## 2015-03-23 NOTE — Anesthesia Postprocedure Evaluation (Signed)
  Anesthesia Post-op Note  Patient: Brendan Macdonald  Procedure(s) Performed: Procedure(s) (LRB): RIGHT KNEE ARTHROSCOPY WITH DEBRIDEMENT (Right) RIGHT EXAM UNDER ANESTHESIA WITH MANIPULATION UNDER ANESTHESIA (Right) LYSIS OF SCAR TISSUE (Right)  Patient Location: PACU  Anesthesia Type: General  Level of Consciousness: awake and alert   Airway and Oxygen Therapy: Patient Spontanous Breathing  Post-op Pain: mild  Post-op Assessment: Post-op Vital signs reviewed, Patient's Cardiovascular Status Stable, Respiratory Function Stable, Patent Airway and No signs of Nausea or vomiting. Required no meds in PACU.  Last Vitals:  Filed Vitals:   03/23/15 1606  BP: 122/77  Pulse: 77  Temp: 36.4 C  Resp: 13    Post-op Vital Signs: stable   Complications: No apparent anesthesia complications

## 2015-03-23 NOTE — Anesthesia Preprocedure Evaluation (Addendum)
Anesthesia Evaluation  Patient identified by MRN, date of birth, ID band Patient awake    Reviewed: Allergy & Precautions, NPO status , Patient's Chart, lab work & pertinent test results  History of Anesthesia Complications (+) history of anesthetic complications  Airway Mallampati: II  TM Distance: >3 FB Neck ROM: Full    Dental no notable dental hx.    Pulmonary neg pulmonary ROS, former smoker,  breath sounds clear to auscultation  Pulmonary exam normal       Cardiovascular negative cardio ROS Normal cardiovascular examRhythm:Regular Rate:Normal     Neuro/Psych negative neurological ROS  negative psych ROS   GI/Hepatic Neg liver ROS, GERD-  ,  Endo/Other  negative endocrine ROS  Renal/GU negative Renal ROS  negative genitourinary   Musculoskeletal  (+) Arthritis -,   Abdominal (+) + obese,   Peds negative pediatric ROS (+)  Hematology negative hematology ROS (+)   Anesthesia Other Findings   Reproductive/Obstetrics negative OB ROS                            Anesthesia Physical Anesthesia Plan  ASA: II  Anesthesia Plan: General   Post-op Pain Management:    Induction: Intravenous  Airway Management Planned: LMA  Additional Equipment:   Intra-op Plan:   Post-operative Plan: Extubation in OR  Informed Consent: I have reviewed the patients History and Physical, chart, labs and discussed the procedure including the risks, benefits and alternatives for the proposed anesthesia with the patient or authorized representative who has indicated his/her understanding and acceptance.   Dental advisory given  Plan Discussed with: CRNA  Anesthesia Plan Comments:         Anesthesia Quick Evaluation

## 2015-03-23 NOTE — Brief Op Note (Signed)
03/23/2015  2:56 PM  PATIENT:  Brendan Macdonald  44 y.o. male  PRE-OPERATIVE DIAGNOSIS:  RIGHT KNEE ARTHROFIBROSIS, PARTIAL TEAR PATELLA AND QUAD TENDON  POST-OPERATIVE DIAGNOSIS:  * No post-op diagnosis entered *  PROCEDURE:  Procedure(s): RIGHT KNEE ARTHROSCOPY WITH DEBRIDEMENT, POSSIBLE REPAIR PATELLA  AND QUADRICEP TENDON  (Right) RIGHT EXAM UNDER ANESTHESIA WITH MANIPULATION UNDER ANESTHESIA (Right) LYSIS OF SCAR TISSUE (Right)  SURGEON:  Surgeon(s) and Role:    * Jene Every, MD - Primary  PHYSICIAN ASSISTANT:   ASSISTANTS: [place]   ANESTHESIA:   general  EBL:     BLOOD ADMINISTERED:none  DRAINS: none   LOCAL MEDICATIONS USED:  MARCAINE     SPECIMEN:  No Specimen  DISPOSITION OF SPECIMEN:  N/A  COUNTS:  YES  TOURNIQUET:    DICTATION: .Other Dictation: Dictation Number [id number]  PLAN OF CARE: Discharge to home after PACU  PATIENT DISPOSITION:  PACU - hemodynamically stable.   Delay start of Pharmacological VTE agent (>24hrs) due to surgical blood loss or risk of bleeding: no

## 2015-03-23 NOTE — Anesthesia Procedure Notes (Signed)
Procedure Name: LMA Insertion Date/Time: 03/23/2015 2:23 PM Performed by: Thornell Mule Pre-anesthesia Checklist: Patient identified, Emergency Drugs available, Suction available, Patient being monitored and Timeout performed Patient Re-evaluated:Patient Re-evaluated prior to inductionOxygen Delivery Method: Circle system utilized Preoxygenation: Pre-oxygenation with 100% oxygen Intubation Type: IV induction LMA: LMA inserted LMA Size: 4.0 Number of attempts: 1 Placement Confirmation: positive ETCO2 Tube secured with: Tape

## 2015-03-23 NOTE — Interval H&P Note (Signed)
History and Physical Interval Note:  03/23/2015 12:36 PM  Brendan Macdonald  has presented today for surgery, with the diagnosis of RIGHT KNEE ARTHROFIBROSIS, PARTIAL TEAR PATELLA AND QUAD TENDON  The various methods of treatment have been discussed with the patient and family. After consideration of risks, benefits and other options for treatment, the patient has consented to  Procedure(s): RIGHT KNEE ARTHROSCOPY WITH DEBRIDEMENT, POSSIBLE REPAIR PATELLA  AND QUADRICEP TENDON  (Right) RIGHT EXAM UNDER ANESTHESIA WITH MANIPULATION UNDER ANESTHESIA (Right) LYSIS OF SCAR TISSUE (Right) as a surgical intervention .  The patient's history has been reviewed, patient examined, no change in status, stable for surgery.  I have reviewed the patient's chart and labs.  Questions were answered to the patient's satisfaction.     English Craighead C

## 2015-03-23 NOTE — Discharge Instructions (Signed)

## 2015-03-23 NOTE — H&P (View-Only) (Signed)
Brendan HerterRobert W Broyhill is an 44 y.o. male.   Chief Complaint: R knee pain HPI: The patient is a 44 year old male who presents today for follow up of their knee. The patient is being followed for their right quadriceps tendon rupture. They are now 4 1/2 months out from surgery. Symptoms reported today include: pain, swelling and instability. Current treatment includes: physical therapy, home exercise program, bracing and activity modification. The following medication has been used for pain control: none. The patient presents today following physical therapy. The patient has reported improvement of their symptoms with: Cortisone injections (helped very little). Note for "Follow-up Knee": Harm follows up for his right knee. He reports the cortisone injection from last visit helped for about 2 days then the knee started hurting again and has been about the same since then. He's continued working with PT, feels he has reached a limit in his flexion. He is able to fully extend the knee but notes anterior knee pain when doing so. He reports ongoing catching, giving way, instability, popping, crunching and grinding. He describes is as broken glass inside the knee. He notes the most sharp pain on the medial side of the knee. The incision itself is still sensitive. He's wearing his hinged brace for support. He is out of work.  Past Medical History  Diagnosis Date  . GERD (gastroesophageal reflux disease)   . Complication of anesthesia     years ago used to hyperventilate no recent problems   . Arthritis     Past Surgical History  Procedure Laterality Date  . Orthopedic surgery      left knee quad tendon tear and patella tear   . Back surgery    . Arthroscopy  Bilateral     Knees  . Rotator cuff repair Right   . Cholecystectomy N/A 02/24/2013    Procedure: LAPAROSCOPIC CHOLECYSTECTOMY;  Surgeon: Dalia HeadingMark A Jenkins, MD;  Location: [facility];  Service: General;  Laterality: N/A;  . Quadriceps tendon repair Right  10/18/2014    Procedure: RIGHT QUADRICEP TENDON REPAIR ;  Surgeon: Javier DockerJeffrey C Beane, MD;  Location: [facility];  Service: Orthopedics;  Laterality: Right;    Family History  Problem Relation Age of Onset  . Cancer Mother   . Diabetes Mother   . Heart disease Father   . Hypertension Father    Social History:  reports that he quit smoking about 4 years ago. His smokeless tobacco use includes Snuff. He reports that he drinks alcohol. He reports that he does not use illicit drugs.  Allergies:  Allergies  Allergen Reactions  . Oxycodone Shortness Of Breath and Other (See Comments)    Hallucinations  . Vicodin [Hydrocodone-Acetaminophen] Shortness Of Breath and Other (See Comments)    Hallucinations     (Not in a hospital admission)  No results found for this or any previous visit (from the past 48 hour(s)). No results found.  Review of Systems  Constitutional: Negative.   HENT: Negative.   Eyes: Negative.   Respiratory: Negative.   Cardiovascular: Negative.   Gastrointestinal: Negative.   Genitourinary: Negative.   Musculoskeletal: Positive for joint pain.  Skin: Negative.   Neurological: Negative.   Psychiatric/Behavioral: Negative.     There were no vitals taken for this visit. Physical Exam  Constitutional: He is oriented to person, place, and time. He appears well-developed and well-nourished.  HENT:  Head: Normocephalic.  Eyes: Pupils are equal, round, and reactive to light.  Neck: Normal range of motion.  Cardiovascular: Normal rate.   Respiratory: Effort normal.  GI: Soft.  Musculoskeletal:  Right Lower Extremity: Right Knee: Inspection and Palpation - Tenderness - quadriceps tender to palpation, patellar tendon tender to palpation and medial joint line tender to palpation, no tenderness to palpation of the superior calf, no tenderness to palpation of the quadriceps tendon, no tenderness to palpation of the patella, no tenderness to palpation of the lateral joint  line, no tenderness to palpation of the fibular head, no tenderness to palpation of the peroneal nerve. Note: mild incisional tenderness. Swelling - mild. Effusion - trace. Tissue tension/texture is - soft. Crepitus - mild patellofemoral crepitus. Pulses - 2+. Sensation - intact to light touch. Skin - Color - no ecchymosis, no erythema. Wound/Incision - Appearance - The incision is well healed with no erythema or exudates. Swelling - there is no swelling around the incision. Healing - The incision is well healed and healing as expected. ROM: Flexion - AROM - 100 . Extension - PROM - 0 . Right Knee - Deformities/Malalignments/Discrepancies - no deformities noted.  Neurological: He is alert and oriented to person, place, and time. He has normal reflexes.  Skin: Skin is warm and dry.    Prior MRI reviewed at last visit with severe patellar tendon with interstitial split tears, quad tendinosis with possible split tear. No meniscal tear noted. Effusion, evidence of prior Osgood Schlatter's.  Assessment/Plan R knee arthrofibrosis, partial tear patellar and quad tendons  Pt 4.5 months out from R quad tendon repair with ongoing stiffness (flexion to 100 degrees), pain, instability, catching and crepitus due to scar tissue and possible occult meniscal tear or chondral flap tear, refractory to bracing, PT, HEP, activity modifications, quad strengthening, medications. We discussed etiology of his symptoms, as well as his underlying DJD. Discussed tx options at this point. He only had 2 days of relief from intra-articular steroid injection which confirms intrinsic knee etiology but would not recommend repeating the injection at this point due to limited duration of relief. Given his ongoing symptoms at this point it is reasonable to consider knee arthroscopy which was discussed at last visit. At this point, recommend proceeding with right knee evaluation and manipulation under anesthesia, arthroscopy, debridement,  lysis of scar tissue, possible repair of patellar or quad tendon. Discussed the procedure itself as well as risks, complications and alternatives, including but not limited to DVT, PE, infx, bleeding, failure of procedure, need for secondary procedure, ongoing pain/symptoms, anesthesia risk, even stroke or death. Also discussed typical post-op protocols, time out of work, ice and elevation, HEP/quad strengthening, possible formal PT. Discussed need for DVT ppx post-op with ASA per protocol. We discussed the possibility of worsening pain and stiffness post-operatively due to DJD and the possible need for future steroid injections, viscosupplementation, repeat arthroscopy, even TKA. All questions were answered. Patient desires to proceed with surgery. We discussed possible need for tendon repair if necessary, which is unlikely given the appearance on MRI. Discussed time out of work, returning to PT immediately after surgery to continue to work on ROM and strength. In the interim, he will continue with HEP, hinged brace, ice, elevation, NSAIDs prn, and activity modifications. He will remain out of work. He will follow up 10-14 days post-op for suture removal and call with any questions or concerns in the interim.  Right knee exam and manipulation under anesthesia, arthroscopy, debridement, lysis of scar tissue, possible repair patellar and quadricep tendons  Jatziri Goffredo M. PA-C for Dr. Shelle Iron  03/12/2015, 8:29 AM

## 2015-03-24 NOTE — Op Note (Signed)
NAME:  Macdonald, Brendan NO.:  [id number]  MEDICAL RECORD NO.:  [id number]  LOCATION:  [facility]                         FACILITY:  [facility]  PHYSICIAN:  Jene Every, M.D.    DATE OF BIRTH:  06-09-1971  DATE OF PROCEDURE:  03/23/2015 DATE OF DISCHARGE:  03/23/2015                              OPERATIVE REPORT   PREOPERATIVE DIAGNOSIS:  Arthrofibrosis, knee contracture, right knee, status post quad tendon repair.  POSTOPERATIVE DIAGNOSES:  Arthrofibrosis, knee contracture, right knee, status post quad tendon repair, grade 3 chondromalacia of medial tibial plateau, partial tear of the anterior cruciate ligament.  PROCEDURES PERFORMED: 1. Manipulation under anesthesia. 2. Right knee arthroscopy. 3. Chondroplasty, medial tibial plateau. 4. Debridement of the partial tear of the anterior cruciate ligament,     synovectomy, debridement.  ANESTHESIA:  General.  ASSISTANT:  Lanna Poche, P.A.  HISTORY:  A 44 year old male, status post quad tendon repair.  He has had persistent pain, swelling of the knee.  Following healing of the tendon, he had suboptimal motion and lacked beyond 90 degrees of flexion and full extension.  He was indicated for knee arthroscopy, debridement, lysis of adhesions, and evaluation of the intraarticular knee and lavaged.  Risks and benefits discussed including bleeding, infection, damage to the neurovascular structures, no change in symptoms, worsening symptoms, DVT, PE, anesthetic complications, etc.  TECHNIQUE:  The patient was placed in supine position.  After induction of adequate general anesthesia, 3 g Kefzol, the right lower extremity was prepped and draped in usual sterile fashion.  Examined under anesthesia.  He had approximately 110 degrees of flexion after full anesthesia.  We manipulated him to 140 with gentle lysis of adhesions. I placed a lateral parapatellar portal.  After we placed him into near full extension, -3  degrees, a lateral parapatellar portal was fashioned with a #11 blade.  Ingress cannula atraumatically placed.  Slightly blood-tinged synovial fluid was evacuated.  Irrigant was utilized to insufflate the joint at 65 mmHg.  Under direct visualization, medial parapatellar port was fashioned with a #11 blade after localization with 18-gauge needle, sparing the medial meniscus.  Noted were some grade 3 changes in the medial tibial plateau.  Light chondroplasty performed here.  Hypertrophic synovitis was noted.  This was debrided in the medial compartment.  The meniscus seemed to be stable to probe palpation without evidence of tearing.  There was small tear of the lateral bundle of the ACL, approximately 15% of that.  This was debrided.  The remainder of the ACL was unremarkable.  Lateral compartment revealed normal femoral condyle, tibial plateau, and meniscus stable to probe palpation.  Hypertrophic synovitis was noted.  This was debrided as well.  In the suprapatellar pouch, there was extensive fibrosis noted and debrided from the lysis.  I performed a full synovectomy and debridement of arthrofibrosis in the mediolateral gutter and suprapatellar pouch.  Examination of the quadriceps tendon was intact and attached to the patella.  There is normal patellofemoral tracking. After full debridement and synovectomy, I copiously lavaged the knee, no active bleeding.  Good patellofemoral tracking was noted.  Again, I revisited all compartments, no further pathology amenable to arthroscopic intervention.  I, therefore, removed  all instrumentation. Portals were closed with 4-0 nylon simple sutures.  0.25% Marcaine with epinephrine was infiltrated in the joint.  Wound was dressed sterilely. Awoken without difficulty and transported to the recovery room in satisfactory condition.  The patient tolerated the procedure well.  No complications.  Assistant, Lanna Poche, P.A.  Minimal blood  loss.     Jene Every, M.D.     Brendan Macdonald  D:  03/23/2015  T:  03/24/2015  Job:  [id number]

## 2015-03-26 ENCOUNTER — Encounter (HOSPITAL_COMMUNITY): Payer: Self-pay | Admitting: Specialist

## 2015-04-16 ENCOUNTER — Ambulatory Visit: Payer: BLUE CROSS/BLUE SHIELD | Attending: Specialist | Admitting: Physical Therapy

## 2015-04-16 DIAGNOSIS — M25561 Pain in right knee: Secondary | ICD-10-CM | POA: Insufficient documentation

## 2015-04-16 NOTE — Therapy (Signed)
[facility] [address] [place], [place], [address] Phone: [phone]   Fax:  [phone]  Physical Therapy Evaluation  Patient Details  Name: PAARTH CROPPER MRN: [id number] Date of Birth: 01-29-1971 Referring Provider:  Jene Every, MD  Encounter Date: 04/16/2015      PT End of Session - 04/16/15 1719    Visit Number 1   Number of Visits 6   Date for PT Re-Evaluation 05/14/15   PT Start Time 0230   PT Stop Time 0312   PT Time Calculation (min) 42 min   Equipment Utilized During Treatment --  Right knee brace with patellar orifice.   Activity Tolerance Patient tolerated treatment well   Behavior During Therapy [facility] for tasks assessed/performed      Past Medical History  Diagnosis Date  . GERD (gastroesophageal reflux disease)   . Complication of anesthesia     years ago used to hyperventilate no recent problems   . Arthritis   . History of chicken pox     Past Surgical History  Procedure Laterality Date  . Orthopedic surgery      left knee quad tendon tear and patella tear   . Back surgery    . Arthroscopy  Bilateral     Knees  . Rotator cuff repair Right   . Cholecystectomy N/A 02/24/2013    Procedure: LAPAROSCOPIC CHOLECYSTECTOMY;  Surgeon: Dalia Heading, MD;  Location: [facility];  Service: General;  Laterality: N/A;  . Quadriceps tendon repair Right 10/18/2014    Procedure: RIGHT QUADRICEP TENDON REPAIR ;  Surgeon: Javier Docker, MD;  Location[facility];  Service: Orthopedics;  Laterality: Right;  . Knee arthroscopy with patellar tendon repair Right 03/23/2015    Procedure: RIGHT KNEE ARTHROSCOPY WITH DEBRIDEMENT;  Surgeon: Jene Every, MD;  [facility];  Service: Orthopedics;  Laterality: Right;  . Exam under anesthesia with manipulation of knee Right 03/23/2015    Procedure: RIGHT EXAM UNDER ANESTHESIA WITH MANIPULATION UNDER ANESTHESIA;  Surgeon: Jene Every, MD;  [facility];  Service: Orthopedics;   Laterality: Right;  . Lysis of adhesion Right 03/23/2015    Procedure: LYSIS OF SCAR TISSUE;  Surgeon: Jene Every, MD;  [facility];  Service: Orthopedics;  Laterality: Right;    There were no vitals filed for this visit.  Visit Diagnosis:  Right knee pain - Plan: PT plan of care cert/re-cert      Subjective Assessment - 04/16/15 1708    Subjective My knee feels loose and that it will buckle.  My Dr. wants me to wean out of the brace.            [facility] PT Assessment - 04/16/15 0001    Assessment   Medical Diagnosis Right knee arthroscopic surgery.   Onset Date/Surgical Date --  03/23/15(surgery date).   Precautions   Precaution Comments Currently using a right knee brace with patellar orifice but was told by his surgeon that he may wean from it.   Restrictions   Weight Bearing Restrictions No   Balance Screen   Has the patient fallen in the past 6 months No   Has the patient had a decrease in activity level because of a fear of falling?  No   Is the patient reluctant to leave their home because of a fear of falling?  No   Home [employer] information [employer] manager residence   Prior Function   Level of Independence Independent   Observation/Other Assessments-Edema  Edema --  Minimal right knee edema.   Posture/Postural Control   Posture Comments Increased right hip ER with patient in supine.   ROM / Strength   AROM / PROM / Strength AROM;Strength   AROM   Overall AROM Comments -5 degrees to 127 degrees of available right knee AROM.   Strength   Overall Strength Comments Notable right quadriceps atrophy when contralaterally compared.   Palpation   Palpation comment Tender at scpoe site especially medially.   Ambulation/Gait   Gait Comments Decreased stance time over right LE.                   [facility] Adult PT Treatment/Exercise - 04/16/15 0001    Manual Therapy   Manual therapy comments STW/M to affected right knee x 11 minutes.                      PT Long Term Goals - 04/16/15 1720    PT LONG TERM GOAL #1   Title Ind with HEP.   Time 3   Period Weeks   Status New   PT LONG TERM GOAL #2   Title Right knee strength a solid 5/5 with no c/o buckling or feeling loose.   Time 3   Period Weeks   PT LONG TERM GOAL #3   Title Perform ADL's with pain not > 3/10.   PT LONG TERM GOAL #5   Title Achieve full right knee extension.   Time 3   Period Weeks   Status New               Plan - 04/16/15 1708    Clinical Impression Statement The patient underwent a right knee arthroscopic surgery on 03/23/15.  He is wearing a right knee brace with a patellar orifice.  he reports his pain-level will rise to a 6-7/10 if he is up for longer periods of time.   Pt will benefit from skilled therapeutic intervention in order to improve on the following deficits Pain;Decreased activity tolerance;Decreased strength;Decreased range of motion   Rehab Potential Excellent   PT Frequency 2x / week   PT Duration 3 weeks   PT Treatment/Interventions Therapeutic exercise;Electrical Stimulation;Manual techniques;Therapeutic activities;Neuromuscular re-education;Passive range of motion   PT Next Visit Plan Stationary bike.  Quad and hamstring strengthening (right); Leg press; standing TKE's; CKC exercises.   Consulted and Agree with Plan of Care Patient         Problem List Patient Active Problem List   Diagnosis Date Noted  . Rupture of right quadriceps tendon 10/18/2014  . Tear of tendon of lower extremity 10/18/2014    APPLEGATE, [place] MPT 04/16/2015, 5:26 PM  [facility] [address] [place], [place], [address] Phone: [phone]   Fax:  [phone]

## 2015-04-19 ENCOUNTER — Encounter: Payer: BLUE CROSS/BLUE SHIELD | Admitting: *Deleted

## 2015-04-24 ENCOUNTER — Ambulatory Visit: Payer: Self-pay | Admitting: Family Medicine

## 2015-04-25 ENCOUNTER — Encounter: Payer: BLUE CROSS/BLUE SHIELD | Admitting: Physical Therapy

## 2015-04-27 ENCOUNTER — Ambulatory Visit: Payer: BLUE CROSS/BLUE SHIELD | Attending: Specialist | Admitting: Physical Therapy

## 2015-04-27 DIAGNOSIS — M25561 Pain in right knee: Secondary | ICD-10-CM | POA: Diagnosis not present

## 2015-04-27 DIAGNOSIS — M25661 Stiffness of right knee, not elsewhere classified: Secondary | ICD-10-CM | POA: Diagnosis present

## 2015-04-27 NOTE — Therapy (Signed)
[facility] [address] [place], [place], [address] Phone: [phone]   Fax:  [phone]  Physical Therapy Treatment  Patient Details  Name: Brendan Macdonald MRN: [id number] Date of Birth: April 06, 1971 Referring Provider:  Ernestina Penna, MD  Encounter Date: 04/27/2015      PT End of Session - 04/27/15 1228    Visit Number 2   Number of Visits 6   Date for PT Re-Evaluation 05/14/15   PT Start Time 0945   PT Stop Time 1021   PT Time Calculation (min) 36 min   Activity Tolerance Patient tolerated treatment well   Behavior During Therapy [facility] for tasks assessed/performed      Past Medical History  Diagnosis Date  . GERD (gastroesophageal reflux disease)   . Complication of anesthesia     years ago used to hyperventilate no recent problems   . Arthritis   . History of chicken pox     Past Surgical History  Procedure Laterality Date  . Orthopedic surgery      left knee quad tendon tear and patella tear   . Back surgery    . Arthroscopy  Bilateral     Knees  . Rotator cuff repair Right   . Cholecystectomy N/A 02/24/2013    Procedure: LAPAROSCOPIC CHOLECYSTECTOMY;  Surgeon: Dalia Heading, MD;  Location: [facility];  Service: General;  Laterality: N/A;  . Quadriceps tendon repair Right 10/18/2014    Procedure: RIGHT QUADRICEP TENDON REPAIR ;  Surgeon: Javier Docker, MD;  Location: [facility];  Service: Orthopedics;  Laterality: Right;  . Knee arthroscopy with patellar tendon repair Right 03/23/2015    Procedure: RIGHT KNEE ARTHROSCOPY WITH DEBRIDEMENT;  Surgeon: Jene Every, MD;  Location: [facility];  Service: Orthopedics;  Laterality: Right;  . Exam under anesthesia with manipulation of knee Right 03/23/2015    Procedure: RIGHT EXAM UNDER ANESTHESIA WITH MANIPULATION UNDER ANESTHESIA;  Surgeon: Jene Every, MD;  [facility];  Service: Orthopedics;  Laterality: Right;  . Lysis of adhesion Right 03/23/2015    Procedure: LYSIS OF SCAR  TISSUE;  Surgeon: Jene Every, MD;  [facility];  Service: Orthopedics;  Laterality: Right;    There were no vitals filed for this visit.  Visit Diagnosis:  Right knee pain  Knee stiffness, right      Subjective Assessment - 04/27/15 1219    Subjective Knee still "pops."                                      PT Long Term Goals - 04/16/15 1720    PT LONG TERM GOAL #1   Title Ind with HEP.   Time 3   Period Weeks   Status New   PT LONG TERM GOAL #2   Title Right knee strength a solid 5/5 with no c/o buckling or feeling loose.   Time 3   Period Weeks   PT LONG TERM GOAL #3   Title Perform ADL's with pain not > 3/10.   PT LONG TERM GOAL #5   Title Achieve full right knee extension.   Time 3   Period Weeks   Status New               Problem List Patient Active Problem List   Diagnosis Date Noted  . Rupture of right quadriceps tendon 10/18/2014  . Tear of tendon of lower  extremity 10/18/2014   TREATMENT:  Stationary bike x 15 minutes at level 3 f/b seated hamstring curls with 40# x 5 minutes f/b bilateral knee extension with 20# x 5 minutes (patient stating no increased pain with this exercise) f/b leg press with 3 1/2 plates 80 degrees to -10 degrees x 5 minutes.  Patient tolerated treatment without complaint.  Jaime Grizzell, [place] MPT 04/27/2015, 1:06 PM  [facility] [address] [place], [place], [address] Phone: [phone]   Fax:  [phone]

## 2015-05-01 ENCOUNTER — Ambulatory Visit: Payer: BLUE CROSS/BLUE SHIELD | Admitting: Physical Therapy

## 2015-05-01 DIAGNOSIS — M25661 Stiffness of right knee, not elsewhere classified: Secondary | ICD-10-CM

## 2015-05-01 DIAGNOSIS — M25561 Pain in right knee: Secondary | ICD-10-CM

## 2015-05-01 NOTE — Therapy (Signed)
[facility] [address] [place], [place], [address] Phone: [phone]   Fax:  [phone]  Physical Therapy Treatment  Patient Details  Name: Brendan Macdonald MRN: [id number] Date of Birth: Aug 06, 1971 Referring Provider:  Ernestina Penna, MD  Encounter Date: 05/01/2015      PT End of Session - 05/01/15 1205    Visit Number 3   Number of Visits 6   Date for PT Re-Evaluation 05/14/15   PT Start Time 1030   PT Stop Time 1111   PT Time Calculation (min) 41 min   Activity Tolerance Patient tolerated treatment well   Behavior During Therapy [facility] for tasks assessed/performed      Past Medical History  Diagnosis Date  . GERD (gastroesophageal reflux disease)   . Complication of anesthesia     years ago used to hyperventilate no recent problems   . Arthritis   . History of chicken pox     Past Surgical History  Procedure Laterality Date  . Orthopedic surgery      left knee quad tendon tear and patella tear   . Back surgery    . Arthroscopy  Bilateral     Knees  . Rotator cuff repair Right   . Cholecystectomy N/A 02/24/2013    Procedure: LAPAROSCOPIC CHOLECYSTECTOMY;  Surgeon: Dalia Heading, MD;  Location: [facility];  Service: General;  Laterality: N/A;  . Quadriceps tendon repair Right 10/18/2014    Procedure: RIGHT QUADRICEP TENDON REPAIR ;  Surgeon: Javier Docker, MD;  Location: [facility];  Service: Orthopedics;  Laterality: Right;  . Knee arthroscopy with patellar tendon repair Right 03/23/2015    Procedure: RIGHT KNEE ARTHROSCOPY WITH DEBRIDEMENT;  Surgeon: Jene Every, MD;  Location: [facility];  Service: Orthopedics;  Laterality: Right;  . Exam under anesthesia with manipulation of knee Right 03/23/2015    Procedure: RIGHT EXAM UNDER ANESTHESIA WITH MANIPULATION UNDER ANESTHESIA;  Surgeon: Jene Every, MD;  [facility]: [facility];  Service: Orthopedics;  Laterality: Right;  . Lysis of adhesion Right 03/23/2015    Procedure: LYSIS OF SCAR  TISSUE;  Surgeon: Jene Every, MD;  [facility]: [facility];  Service: Orthopedics;  Laterality: Right;    There were no vitals filed for this visit.  Visit Diagnosis:  Right knee pain  Knee stiffness, right      Subjective Assessment - 05/01/15 1204    Subjective I felt some "good sorness" from that workout.   Pain Score 5    Pain Location Knee   Pain Orientation Right   Pain Descriptors / Indicators Aching   Pain Type Surgical pain   Pain Frequency Constant                                      PT Long Term Goals - 04/16/15 1720    PT LONG TERM GOAL #1   Title Ind with HEP.   Time 3   Period Weeks   Status New   PT LONG TERM GOAL #2   Title Right knee strength a solid 5/5 with no c/o buckling or feeling loose.   Time 3   Period Weeks   PT LONG TERM GOAL #3   Title Perform ADL's with pain not > 3/10.   PT LONG TERM GOAL #5   Title Achieve full right knee extension.   Time 3   Period Weeks   Status New  Treatment:  Stationary bike level 2 x 15 minutes.  20# knee extension x 5 minutes. 50# ham curls x 5 minues. Leg Press 3 1/2 plates x 5 minutes 80 to -10 degrees.          Problem List Patient Active Problem List   Diagnosis Date Noted  . Rupture of right quadriceps tendon 10/18/2014  . Tear of tendon of lower extremity 10/18/2014    Daysen Gundrum, [place] MPT 05/01/2015, 12:16 PM  [facility] [address] [place], [place], [address] Phone: [phone]   Fax:  [phone]

## 2015-05-09 ENCOUNTER — Ambulatory Visit: Payer: BLUE CROSS/BLUE SHIELD | Admitting: Physical Therapy

## 2015-05-09 DIAGNOSIS — M25561 Pain in right knee: Secondary | ICD-10-CM | POA: Diagnosis not present

## 2015-05-09 DIAGNOSIS — M25661 Stiffness of right knee, not elsewhere classified: Secondary | ICD-10-CM

## 2015-05-09 NOTE — Therapy (Signed)
[facility] [address] [place], [place], [address] Phone: [phone]   Fax:  [phone]  Physical Therapy Treatment  Patient Details  Name: Brendan Macdonald MRN: [id number] Date of Birth: [birth year] Referring Provider:  Ernestina Penna, MD  Encounter Date: 05/09/2015    Past Medical History  Diagnosis Date  . GERD (gastroesophageal reflux disease)   . Complication of anesthesia     years ago used to hyperventilate no recent problems   . Arthritis   . History of chicken pox     Past Surgical History  Procedure Laterality Date  . Orthopedic surgery      left knee quad tendon tear and patella tear   . Back surgery    . Arthroscopy  Bilateral     Knees  . Rotator cuff repair Right   . Cholecystectomy N/A 02/24/2013    Procedure: LAPAROSCOPIC CHOLECYSTECTOMY;  Surgeon: Dalia Heading, MD;  Location: [facility];  Service: General;  Laterality: N/A;  . Quadriceps tendon repair Right 10/18/2014    Procedure: RIGHT QUADRICEP TENDON REPAIR ;  Surgeon: Javier Docker, MD;  [facility]: [facility];  Service: Orthopedics;  Laterality: Right;  . Knee arthroscopy with patellar tendon repair Right 03/23/2015    Procedure: RIGHT KNEE ARTHROSCOPY WITH DEBRIDEMENT;  Surgeon: Jene Every, MD;  [facility]: [facility];  Service: Orthopedics;  Laterality: Right;  . Exam under anesthesia with manipulation of knee Right 03/23/2015    Procedure: RIGHT EXAM UNDER ANESTHESIA WITH MANIPULATION UNDER ANESTHESIA;  Surgeon: Jene Every, MD;  [facility]: [facility];  Service: Orthopedics;  Laterality: Right;  . Lysis of adhesion Right 03/23/2015    Procedure: LYSIS OF SCAR TISSUE;  Surgeon: Jene Every, MD;  Location: [facility];  Service: Orthopedics;  Laterality: Right;    There were no vitals filed for this visit.  Visit Diagnosis:  Right knee pain  Knee stiffness, right      Subjective Assessment - 05/09/15 0937    Subjective Pain not to bad today, about a 2/10.  I hope  to be back in the truck by [employer].   Pain Score 2    Pain Location Knee   Pain Orientation Right   Pain Descriptors / Indicators Aching   Pain Type Surgical pain   Pain Frequency Constant                                      PT Long Term Goals - 05/09/15 1110    PT LONG TERM GOAL #1   Title Ind with HEP.   Time 3   Period Weeks   Status New   PT LONG TERM GOAL #2   Title Right knee strength a solid 5/5 with no c/o buckling or feeling loose.   Time 3   Status On-going   PT LONG TERM GOAL #3   Title Perform ADL's with pain not > 3/10.   Time 3   Period Weeks   Status On-going   PT LONG TERM GOAL #5   Title Achieve full right knee extension.   Time 3   Period Weeks   Status Achieved     Treatment:  Elliptical L3/L3 x 5 minutes f/b stationary bike @ L3 x 10 minutes f/b 20# knee extension x 6 minutes f/b 50# ham curls x 6 minutes f/b leg press 80 to -10 degrees x 6 minutes.  Forward and lateral step-ups  in parallel bars x 6 minutes.  No complaints with there ex today.          Plan - 05/09/15 1110    Clinical Impression Statement Overall much better with a pain-level around a 2/10 today.   Pt will benefit from skilled therapeutic intervention in order to improve on the following deficits Pain;Decreased activity tolerance;Decreased strength;Decreased range of motion   Rehab Potential Excellent   PT Frequency 2x / week   PT Duration 3 weeks   PT Treatment/Interventions Therapeutic exercise;Electrical Stimulation;Manual techniques;Therapeutic activities;Neuromuscular re-education;Passive range of motion   PT Next Visit Plan Stationary bike.  Quad and hamstring strengthening (right); Leg press; standing TKE's; CKC exercises.        Problem List Patient Active Problem List   Diagnosis Date Noted  . Rupture of right quadriceps tendon 10/18/2014  . Tear of tendon of lower extremity 10/18/2014    Zev Blue, [place] MPT 05/09/2015, 11:11  AM  [facility] [address] [place], [place], [address] Phone: [phone]   Fax:  [phone]

## 2015-05-16 ENCOUNTER — Encounter: Payer: BLUE CROSS/BLUE SHIELD | Admitting: Physical Therapy

## 2015-05-21 ENCOUNTER — Ambulatory Visit: Payer: BLUE CROSS/BLUE SHIELD | Admitting: Physical Therapy

## 2015-05-21 DIAGNOSIS — M25561 Pain in right knee: Secondary | ICD-10-CM | POA: Diagnosis not present

## 2015-05-21 NOTE — Therapy (Signed)
[facility] [address] [place], [place], [address] Phone: [phone]   Fax:  [phone]  Physical Therapy Treatment  Patient Details  Name: Brendan Macdonald MRN: [id number] Date of Birth: Jun 13, 1971 Referring Provider:  Ernestina Penna, MD  Encounter Date: 05/21/2015      PT End of Session - 05/21/15 1439    Visit Number 4   Number of Visits 6   Date for PT Re-Evaluation 05/14/15   PT Start Time 0145   PT Stop Time 0232   PT Time Calculation (min) 47 min   Activity Tolerance Patient tolerated treatment well   Behavior During Therapy [facility] for tasks assessed/performed      Past Medical History  Diagnosis Date  . GERD (gastroesophageal reflux disease)   . Complication of anesthesia     years ago used to hyperventilate no recent problems   . Arthritis   . History of chicken pox     Past Surgical History  Procedure Laterality Date  . Orthopedic surgery      left knee quad tendon tear and patella tear   . Back surgery    . Arthroscopy  Bilateral     Knees  . Rotator cuff repair Right   . Cholecystectomy N/A 02/24/2013    Procedure: LAPAROSCOPIC CHOLECYSTECTOMY;  Surgeon: Dalia Heading, MD;  Location: [facility];  Service: General;  Laterality: N/A;  . Quadriceps tendon repair Right 10/18/2014    Procedure: RIGHT QUADRICEP TENDON REPAIR ;  Surgeon: Javier Docker, MD;  Location: [facility];  Service: Orthopedics;  Laterality: Right;  . Knee arthroscopy with patellar tendon repair Right 03/23/2015    Procedure: RIGHT KNEE ARTHROSCOPY WITH DEBRIDEMENT;  Surgeon: Jene Every, MD;  Location: [facility];  Service: Orthopedics;  Laterality: Right;  . Exam under anesthesia with manipulation of knee Right 03/23/2015    Procedure: RIGHT EXAM UNDER ANESTHESIA WITH MANIPULATION UNDER ANESTHESIA;  Surgeon: Jene Every, MD;  Location[facility];  Service: Orthopedics;  Laterality: Right;  . Lysis of adhesion Right 03/23/2015    Procedure: LYSIS OF SCAR  TISSUE;  Surgeon: Jene Every, MD;  Location: [facility];  Service: Orthopedics;  Laterality: Right;    There were no vitals filed for this visit.  Visit Diagnosis:  Right knee pain      Subjective Assessment - 05/21/15 1350    Subjective Pain ranges from a 2-5/10 depending on what I do.   Pain Score 3    Pain Location Knee   Pain Orientation Right   Pain Descriptors / Indicators Aching   Pain Type Surgical pain   Pain Frequency Constant                                      PT Long Term Goals - 05/09/15 1110    PT LONG TERM GOAL #1   Title Ind with HEP.   Time 3   Period Weeks   Status New   PT LONG TERM GOAL #2   Title Right knee strength a solid 5/5 with no c/o buckling or feeling loose.   Time 3   Status On-going   PT LONG TERM GOAL #3   Title Perform ADL's with pain not > 3/10.   Time 3   Period Weeks   Status On-going   PT LONG TERM GOAL #5   Title Achieve full right knee extension.   Time  3   Period Weeks   Status Achieved     Treatment:  Stationary bike x 15 minutes at level 3; knee extension machine 20# x 5 minutes f/b 50# hamstring curls; Leg press 4 plates -10 to 90 degrees f/b IASTM to patient's right quad/patellar tendon--patient felt good after treatment.          Problem List Patient Active Problem List   Diagnosis Date Noted  . Rupture of right quadriceps tendon 10/18/2014  . Tear of tendon of lower extremity 10/18/2014    APPLEGATE, [place] MPT 05/21/2015, 2:46 PM  [facility] [address] [place], [place], [address] Phone: [phone]   Fax:  [phone]

## 2015-05-30 ENCOUNTER — Encounter: Payer: BLUE CROSS/BLUE SHIELD | Admitting: Physical Therapy

## 2015-11-05 NOTE — Therapy (Signed)
[facility] [place], [place], [address] Phone: [phone]   Fax:  [phone]  Physical Therapy Treatment  Patient Details  Name: Brendan Macdonald MRN: [id number] Date of Birth: 1971/03/28 No Data Recorded  Encounter Date: 05/21/2015    Past Medical History  Diagnosis Date  . GERD (gastroesophageal reflux disease)   . Complication of anesthesia     years ago used to hyperventilate no recent problems   . Arthritis   . History of chicken pox     Past Surgical History  Procedure Laterality Date  . Orthopedic surgery      left knee quad tendon tear and patella tear   . Back surgery    . Arthroscopy  Bilateral     Knees  . Rotator cuff repair Right   . Cholecystectomy N/A 02/24/2013    Procedure: LAPAROSCOPIC CHOLECYSTECTOMY;  Surgeon: Jamesetta So, MD;  Location: [facility];  Service: General;  Laterality: N/A;  . Quadriceps tendon repair Right 10/18/2014    Procedure: RIGHT QUADRICEP TENDON REPAIR ;  Surgeon: Johnn Hai, MD;  Location: [facility];  Service: Orthopedics;  Laterality: Right;  . Knee arthroscopy with patellar tendon repair Right 03/23/2015    Procedure: RIGHT KNEE ARTHROSCOPY WITH DEBRIDEMENT;  Surgeon: Susa Day, MD;  Location: [facility];  Service: Orthopedics;  Laterality: Right;  . Exam under anesthesia with manipulation of knee Right 03/23/2015    Procedure: RIGHT EXAM UNDER ANESTHESIA WITH MANIPULATION UNDER ANESTHESIA;  Surgeon: Susa Day, MD;  Location: [facility];  Service: Orthopedics;  Laterality: Right;  . Lysis of adhesion Right 03/23/2015    Procedure: LYSIS OF SCAR TISSUE;  Surgeon: Susa Day, MD;  Location: [facility];  Service: Orthopedics;  Laterality: Right;    There were no vitals filed for this visit.  Visit Diagnosis:  Right knee pain                                    PT Long Term Goals - 05/09/15 1110    PT LONG TERM GOAL #1   Title Ind with HEP.   Time 3   Period Weeks   Status New   PT LONG TERM GOAL #2   Title Right knee strength a solid 5/5 with no c/o buckling or feeling loose.   Time 3   Status On-going   PT LONG TERM GOAL #3   Title Perform ADL's with pain not > 3/10.   Time 3   Period Weeks   Status On-going   PT LONG TERM GOAL #5   Title Achieve full right knee extension.   Time 3   Period Weeks   Status Achieved               Problem List Patient Active Problem List   Diagnosis Date Noted  . Rupture of right quadriceps tendon 10/18/2014  . Tear of tendon of lower extremity 10/18/2014  PHYSICAL THERAPY DISCHARGE SUMMARY  Visits from Start of Care: 19  Current functional level related to goals / functional outcomes: Please see above.   Remaining deficits: Continued right knee pain.   Education / Equipment: HEP.  Plan: Patient agrees to discharge.  Patient goals were partially met. Patient is being discharged due to meeting the stated rehab goals.  ?????       Brendan Macdonald, Brendan Macdonald 11/05/2015, 5:39 PM  [facility] [address]  [place], [place], [address] Phone: [phone]   Fax:  [phone]  Name: Brendan Macdonald MRN: [id number] Date of Birth: 08-08-1971

## 2015-11-23 ENCOUNTER — Emergency Department (HOSPITAL_COMMUNITY): Payer: BLUE CROSS/BLUE SHIELD

## 2015-11-23 ENCOUNTER — Encounter (HOSPITAL_COMMUNITY): Payer: Self-pay | Admitting: Emergency Medicine

## 2015-11-23 DIAGNOSIS — Z8619 Personal history of other infectious and parasitic diseases: Secondary | ICD-10-CM | POA: Diagnosis not present

## 2015-11-23 DIAGNOSIS — R079 Chest pain, unspecified: Secondary | ICD-10-CM | POA: Diagnosis present

## 2015-11-23 DIAGNOSIS — R0602 Shortness of breath: Secondary | ICD-10-CM | POA: Diagnosis not present

## 2015-11-23 DIAGNOSIS — K219 Gastro-esophageal reflux disease without esophagitis: Secondary | ICD-10-CM | POA: Diagnosis not present

## 2015-11-23 DIAGNOSIS — Z79899 Other long term (current) drug therapy: Secondary | ICD-10-CM | POA: Diagnosis not present

## 2015-11-23 DIAGNOSIS — M1990 Unspecified osteoarthritis, unspecified site: Secondary | ICD-10-CM | POA: Diagnosis not present

## 2015-11-23 DIAGNOSIS — Z87891 Personal history of nicotine dependence: Secondary | ICD-10-CM | POA: Insufficient documentation

## 2015-11-23 DIAGNOSIS — R05 Cough: Secondary | ICD-10-CM | POA: Insufficient documentation

## 2015-11-23 DIAGNOSIS — Z7951 Long term (current) use of inhaled steroids: Secondary | ICD-10-CM | POA: Diagnosis not present

## 2015-11-23 DIAGNOSIS — R6883 Chills (without fever): Secondary | ICD-10-CM | POA: Insufficient documentation

## 2015-11-23 NOTE — ED Notes (Signed)
Pt. reports intermittent central chest pain /tightness onset this week with SOB and  productive cough , denies nausea or diaphoresis and  mild dizziness.

## 2015-11-24 ENCOUNTER — Emergency Department (HOSPITAL_COMMUNITY)
Admission: EM | Admit: 2015-11-24 | Discharge: 2015-11-24 | Disposition: A | Discharge status: Home / Self Care | Payer: BLUE CROSS/BLUE SHIELD | Attending: Emergency Medicine | Admitting: Emergency Medicine

## 2015-11-24 DIAGNOSIS — R079 Chest pain, unspecified: Secondary | ICD-10-CM

## 2015-11-24 LAB — BASIC METABOLIC PANEL
Anion gap: 10 (ref 5–15)
BUN: 12 mg/dL (ref 6–20)
CALCIUM: 9.5 mg/dL (ref 8.9–10.3)
CHLORIDE: 102 mmol/L (ref 101–111)
CO2: 25 mmol/L (ref 22–32)
Creatinine, Ser: 1.11 mg/dL (ref 0.61–1.24)
GFR calc Af Amer: >60 mL/min (ref >60)
GFR calc non Af Amer: >60 mL/min (ref >60)
Glucose, Bld: 135 mg/dL — ABNORMAL HIGH (ref 65–99)
Potassium: 3.9 mmol/L (ref 3.5–5.1)
Sodium: 137 mmol/L (ref 135–145)

## 2015-11-24 LAB — CBC
HCT: 41.9 % (ref 39.0–52.0)
Hemoglobin: 14.4 g/dL (ref 13.0–17.0)
MCH: 28.3 pg (ref 26.0–34.0)
MCHC: 34.4 g/dL (ref 30.0–36.0)
MCV: 82.3 fL (ref 78.0–100.0)
PLATELETS: 250 10*3/uL (ref 150–400)
RBC: 5.09 MIL/uL (ref 4.22–5.81)
RDW: 12.7 % (ref 11.5–15.5)
WBC: 8.6 10*3/uL (ref 4.0–10.5)

## 2015-11-24 LAB — I-STAT TROPONIN, ED: TROPONIN I, POC: 0 ng/mL (ref 0.00–0.08)

## 2015-11-24 LAB — TROPONIN I

## 2015-11-24 MED ORDER — GI COCKTAIL ~~LOC~~
30.0000 mL | Freq: Once | ORAL | Status: AC
  Administered 2015-11-24: 30 mL via ORAL
  Filled 2015-11-24: qty 30

## 2015-11-24 MED ORDER — KETOROLAC TROMETHAMINE 60 MG/2ML IM SOLN
60.0000 mg | Freq: Once | INTRAMUSCULAR | Status: AC
  Administered 2015-11-24: 60 mg via INTRAMUSCULAR
  Filled 2015-11-24: qty 2

## 2015-11-24 MED ORDER — RANITIDINE HCL 150 MG PO TABS: 150.0000 mg | ORAL_TABLET | Freq: Two times a day (BID) | ORAL | Status: DC

## 2015-11-24 NOTE — Discharge Instructions (Signed)
Nonspecific Chest Pain  °Chest pain can be caused by many different conditions. There is always a chance that your pain could be related to something serious, such as a heart attack or a blood clot in your lungs. Chest pain can also be caused by conditions that are not life-threatening. If you have chest pain, it is very important to follow up with your health care provider. °CAUSES  °Chest pain can be caused by: °· Heartburn. °· Pneumonia or bronchitis. °· Anxiety or stress. °· Inflammation around your heart (pericarditis) or lung (pleuritis or pleurisy). °· A blood clot in your lung. °· A collapsed lung (pneumothorax). It can develop suddenly on its own (spontaneous pneumothorax) or from trauma to the chest. °· Shingles infection (varicella-zoster virus). °· Heart attack. °· Damage to the bones, muscles, and cartilage that make up your chest wall. This can include: °¨ Bruised bones due to injury. °¨ Strained muscles or cartilage due to frequent or repeated coughing or overwork. °¨ Fracture to one or more ribs. °¨ Sore cartilage due to inflammation (costochondritis). °RISK FACTORS  °Risk factors for chest pain may include: °· Activities that increase your risk for trauma or injury to your chest. °· Respiratory infections or conditions that cause frequent coughing. °· Medical conditions or overeating that can cause heartburn. °· Heart disease or family history of heart disease. °· Conditions or health behaviors that increase your risk of developing a blood clot. °· Having had chicken pox (varicella zoster). °SIGNS AND SYMPTOMS °Chest pain can feel like: °· Burning or tingling on the surface of your chest or deep in your chest. °· Crushing, pressure, aching, or squeezing pain. °· Dull or sharp pain that is worse when you move, cough, or take a deep breath. °· Pain that is also felt in your back, neck, shoulder, or arm, or pain that spreads to any of these areas. °Your chest pain may come and go, or it may stay  constant. °DIAGNOSIS °Lab tests or other studies may be needed to find the cause of your pain. Your health care provider may have you take a test called an ambulatory ECG (electrocardiogram). An ECG records your heartbeat patterns at the time the test is performed. You may also have other tests, such as: °· Transthoracic echocardiogram (TTE). During echocardiography, sound waves are used to create a picture of all of the heart structures and to look at how blood flows through your heart. °· Transesophageal echocardiogram (TEE). This is a more advanced imaging test that obtains images from inside your body. It allows your health care provider to see your heart in finer detail. °· Cardiac monitoring. This allows your health care provider to monitor your heart rate and rhythm in real time. °· Holter monitor. This is a portable device that records your heartbeat and can help to diagnose abnormal heartbeats. It allows your health care provider to track your heart activity for several days, if needed. °· Stress tests. These can be done through exercise or by taking medicine that makes your heart beat more quickly. °· Blood tests. °· Imaging tests. °TREATMENT  °Your treatment depends on what is causing your chest pain. Treatment may include: °· Medicines. These may include: °¨ Acid blockers for heartburn. °¨ Anti-inflammatory medicine. °¨ Pain medicine for inflammatory conditions. °¨ Antibiotic medicine, if an infection is present. °¨ Medicines to dissolve blood clots. °¨ Medicines to treat coronary artery disease. °· Supportive care for conditions that do not require medicines. This may include: °¨ Resting. °¨ Applying heat   or cold packs to injured areas. °¨ Limiting activities until pain decreases. °HOME CARE INSTRUCTIONS °· If you were prescribed an antibiotic medicine, finish it all even if you start to feel better. °· Avoid any activities that bring on chest pain. °· Do not use any tobacco products, including  cigarettes, chewing tobacco, or electronic cigarettes. If you need help quitting, ask your health care provider. °· Do not drink alcohol. °· Take medicines only as directed by your health care provider. °· Keep all follow-up visits as directed by your health care provider. This is important. This includes any further testing if your chest pain does not go away. °· If heartburn is the cause for your chest pain, you may be told to keep your head raised (elevated) while sleeping. This reduces the chance that acid will go from your stomach into your esophagus. °· Make lifestyle changes as directed by your health care provider. These may include: °¨ Getting regular exercise. Ask your health care provider to suggest some activities that are safe for you. °¨ Eating a heart-healthy diet. A registered dietitian can help you to learn healthy eating options. °¨ Maintaining a healthy weight. °¨ Managing diabetes, if necessary. °¨ Reducing stress. °SEEK MEDICAL CARE IF: °· Your chest pain does not go away after treatment. °· You have a rash with blisters on your chest. °· You have a fever. °SEEK IMMEDIATE MEDICAL CARE IF:  °· Your chest pain is worse. °· You have an increasing cough, or you cough up blood. °· You have severe abdominal pain. °· You have severe weakness. °· You faint. °· You have chills. °· You have sudden, unexplained chest discomfort. °· You have sudden, unexplained discomfort in your arms, back, neck, or jaw. °· You have shortness of breath at any time. °· You suddenly start to sweat, or your skin gets clammy. °· You feel nauseous or you vomit. °· You suddenly feel light-headed or dizzy. °· Your heart begins to beat quickly, or it feels like it is skipping beats. °These symptoms may represent a serious problem that is an emergency. Do not wait to see if the symptoms will go away. Get medical help right away. Call your local emergency services (911 in the [place] Do not drive yourself to the hospital. °  °This  information is not intended to replace advice given to you by your health care provider. Make sure you discuss any questions you have with your health care provider. °  °Document Released: 05/21/2005 Document Revised: 09/01/2014 Document Reviewed: 03/17/2014 °Elsevier Interactive Patient Education ©2016 [employer] ° °

## 2015-11-24 NOTE — ED Provider Notes (Signed)
CSN: [id number]     Arrival date & time 11/23/15  2325 History  By signing my name below, I, Brendan Macdonald, attest that this documentation has been prepared under the direction and in the presence of Azalia Bilis, MD. Electronically Signed: Octavia Heir, ED Scribe. 11/24/2015. 2:19 AM.    Chief Complaint  Patient presents with  . Chest Pain      The history is provided by the patient. No language interpreter was used.   HPI Comments: TULLIO CHAUSSE is a 45 y.o. male who has a PMHx of GERD and arthritis presents to the Emergency Department complaining of intermittent, gradual worsening, stabbing chest tightness, chest pressure with associated shortness of breath, productive cough, and chills onset 4 days ago. He states it feels as if an elephant is sitting on his chest. Pt endorses increased chest pain when taking a deep breath and reports it feels as if he cannot catch his breath. Pt says his blood pressure was higher than normal today (189/99). He reports his blood pressure is never over 114/77. Pt has a paternal family hx of heart disease. Denies fever. Past Medical History  Diagnosis Date  . GERD (gastroesophageal reflux disease)   . Complication of anesthesia     years ago used to hyperventilate no recent problems   . Arthritis   . History of chicken pox    Past Surgical History  Procedure Laterality Date  . Orthopedic surgery      left knee quad tendon tear and patella tear   . Back surgery    . Arthroscopy  Bilateral     Knees  . Rotator cuff repair Right   . Cholecystectomy N/A 02/24/2013    Procedure: LAPAROSCOPIC CHOLECYSTECTOMY;  Surgeon: Dalia Heading, MD;  Location: [facility];  Service: General;  Laterality: N/A;  . Quadriceps tendon repair Right 10/18/2014    Procedure: RIGHT QUADRICEP TENDON REPAIR ;  Surgeon: Javier Docker, MD;  Location: [facility];  Service: Orthopedics;  Laterality: Right;  . Knee arthroscopy with patellar tendon repair Right 03/23/2015    Procedure:  RIGHT KNEE ARTHROSCOPY WITH DEBRIDEMENT;  Surgeon: Jene Every, MD;  Location: [facility];  Service: Orthopedics;  Laterality: Right;  . Exam under anesthesia with manipulation of knee Right 03/23/2015    Procedure: RIGHT EXAM UNDER ANESTHESIA WITH MANIPULATION UNDER ANESTHESIA;  Surgeon: Jene Every, MD;  Location: [facility];  Service: Orthopedics;  Laterality: Right;  . Lysis of adhesion Right 03/23/2015    Procedure: LYSIS OF SCAR TISSUE;  Surgeon: Jene Every, MD;  Location: [facility];  Service: Orthopedics;  Laterality: Right;   Family History  Problem Relation Age of Onset  . Cancer Mother   . Diabetes Mother   . Heart disease Father   . Hypertension Father    Social History  Substance Use Topics  . Smoking status: Former Smoker -- 0.75 packs/day for 20 years    Types: Cigarettes, Cigars    Quit date: 08/25/2012  . Smokeless tobacco: Current User    Types: Snuff  . Alcohol Use: Yes     Comment: Occasionally    Review of Systems  A complete 10 system review of systems was obtained and all systems are negative except as noted in the HPI and PMH.    Allergies  Oxycodone and Vicodin  Home Medications   Prior to Admission medications   Medication Sig Start Date End Date Taking? Authorizing Provider  cyclobenzaprine (FLEXERIL) 10 MG tablet Take 1 tablet (10 mg  total) by mouth 3 (three) times daily as needed for muscle spasms. Patient not taking: Reported on 11/10/2014 10/18/14   Jene EveryJeffrey Beane, MD  docusate sodium (COLACE) 100 MG capsule Take 1 capsule (100 mg total) by mouth 2 (two) times daily as needed for mild constipation. Patient not taking: Reported on 11/10/2014 10/18/14   Jene EveryJeffrey Beane, MD  docusate sodium (COLACE) 100 MG capsule Take 1 capsule (100 mg total) by mouth 2 (two) times daily as needed for mild constipation. Patient not taking: Reported on 04/16/2015 03/23/15   Jene EveryJeffrey Beane, MD  fexofenadine-pseudoephedrine (ALLEGRA-D 24) 180-240 MG per 24 hr tablet Take 1 tablet by  mouth daily. 10/05/13   Deatra CanterWilliam J Oxford, FNP  fluticasone (FLONASE) 50 MCG/ACT nasal spray Place 2 sprays into both nostrils daily. Patient taking differently: Place 1 spray into both nostrils daily.  10/05/13   Deatra CanterWilliam J Oxford, FNP  HYDROmorphone (DILAUDID) 4 MG tablet Take 1 tablet (4 mg total) by mouth every 4 (four) hours as needed for severe pain. Patient not taking: Reported on 03/14/2015 10/18/14   Jene EveryJeffrey Beane, MD  HYDROmorphone (DILAUDID) 4 MG tablet Take 1 tablet (4 mg total) by mouth every 4 (four) hours as needed for severe pain. 03/23/15   Jene EveryJeffrey Beane, MD  ibuprofen (ADVIL,MOTRIN) 200 MG tablet Take 800 mg by mouth every 6 (six) hours as needed for moderate pain.    Historical Provider, MD  meloxicam (MOBIC) 15 MG tablet Take 1 tablet (15 mg total) by mouth daily. Patient not taking: Reported on 03/14/2015 09/21/13   Deatra CanterWilliam J Oxford, FNP  methocarbamol (ROBAXIN) 500 MG tablet Take 1 tablet (500 mg total) by mouth 3 (three) times daily. Patient not taking: Reported on 04/16/2015 03/23/15   Jene EveryJeffrey Beane, MD  ranitidine (ZANTAC) 150 MG tablet Take 150 mg by mouth 2 (two) times daily.    Historical Provider, MD  vitamin B-12 (CYANOCOBALAMIN) 50 MCG tablet Take 50 mcg by mouth daily.    Historical Provider, MD   Triage vitals: BP 133/91 mmHg  Pulse 85  Temp(Src) 97.7 F (36.5 C) (Oral)  Resp 20  Ht 6\' 5"  (1.956 m)  Wt 284 lb (128.822 kg)  BMI 33.67 kg/m2  SpO2 96% Physical Exam  Constitutional: He is oriented to person, place, and time. He appears well-developed and well-nourished.  HENT:  Head: Normocephalic and atraumatic.  Eyes: EOM are normal.  Neck: Normal range of motion.  Cardiovascular: Normal rate, regular rhythm, normal heart sounds and intact distal pulses.   Pulmonary/Chest: Effort normal and breath sounds normal. No respiratory distress.  Abdominal: Soft. He exhibits no distension. There is no tenderness.  Musculoskeletal: Normal range of motion.  Neurological: He  is alert and oriented to person, place, and time.  Skin: Skin is warm and dry.  Psychiatric: He has a normal mood and affect. Judgment normal.  Nursing note and vitals reviewed.   ED Course  Procedures  DIAGNOSTIC STUDIES: Oxygen Saturation is 96% on RA, normal by my interpretation.  COORDINATION OF CARE:  2:18 AM Discussed treatment plan which includes repeat EKG, toradol and GI cocktail with pt at bedside and pt agreed to plan.  Labs Review Labs Reviewed  CBC  BASIC METABOLIC PANEL  Rosezena SensorI-STAT TROPOININ, ED    Imaging Review Dg Chest 2 View  11/24/2015  CLINICAL DATA:  Acute onset of intermittent central chest pain and tightness. Shortness of breath and productive cough. Mild dizziness. Initial encounter. EXAM: CHEST  2 VIEW COMPARISON:  Chest radiograph from 10/22/2013 FINDINGS:  The lungs are well-aerated. Mild peribronchial thickening is noted. There is no evidence of focal opacification, pleural effusion or pneumothorax. The heart is normal in size; the mediastinal contour is within normal limits. No acute osseous abnormalities are seen. Clips are noted within the right upper quadrant, reflecting prior cholecystectomy. IMPRESSION: Mild peribronchial thickening noted.  Lungs remain grossly clear. Electronically Signed   By: Roanna Raider M.D.   On: 11/24/2015 00:06   I have personally reviewed and evaluated these images and lab results as part of my medical decision-making.   EKG Interpretation#1 Date/Time:  Friday November 23 2015 23:27:54 EDT Ventricular Rate:  90 PR Interval:  124 QRS Duration: 90 QT Interval:  346 QTC Calculation: 423 R Axis:   4 Text Interpretation:  Normal sinus rhythm Normal ECG No significant change  was found Confirmed by Makynlie Rossini  MD, Caryn Bee ([id number]) on 11/24/2015 2:16:45 AM    ECG interpretation #2  Date: 11/24/2015  Rate: 68  Rhythm: normal sinus rhythm  QRS Axis: normal  Intervals: normal  ST/T Wave abnormalities: normal  Conduction Disutrbances:  none  Narrative Interpretation:   Old EKG Reviewed: No significant changes noted     MDM   Final diagnoses:  Chest pain, unspecified chest pain type   4:24 AM Patient feels better this time.  EKG 2 and troponin 2 without acute abnormality.  Some improvement with GI cocktail.  May be esophagitis/gastroesophageal reflux disease.  Discharge home in good condition.  Primary care follow-up.  Patient is PERC negative   I personally performed the services described in this documentation, which was scribed in my presence. The recorded information has been reviewed and is accurate.      Azalia Bilis, MD 11/24/15 [phone]

## 2015-11-26 ENCOUNTER — Encounter: Payer: Self-pay | Admitting: *Deleted

## 2015-11-26 ENCOUNTER — Encounter (INDEPENDENT_AMBULATORY_CARE_PROVIDER_SITE_OTHER): Payer: Self-pay

## 2015-11-26 ENCOUNTER — Ambulatory Visit (INDEPENDENT_AMBULATORY_CARE_PROVIDER_SITE_OTHER): Payer: BLUE CROSS/BLUE SHIELD

## 2015-11-26 ENCOUNTER — Encounter: Payer: Self-pay | Admitting: Family Medicine

## 2015-11-26 ENCOUNTER — Ambulatory Visit (INDEPENDENT_AMBULATORY_CARE_PROVIDER_SITE_OTHER): Payer: BLUE CROSS/BLUE SHIELD | Admitting: Family Medicine

## 2015-11-26 VITALS — BP 136/79 | HR 93 | Temp 97.0°F | Ht 75.5 in | Wt 288.4 lb

## 2015-11-26 DIAGNOSIS — R42 Dizziness and giddiness: Secondary | ICD-10-CM | POA: Diagnosis not present

## 2015-11-26 DIAGNOSIS — R0789 Other chest pain: Secondary | ICD-10-CM

## 2015-11-26 DIAGNOSIS — R0600 Dyspnea, unspecified: Secondary | ICD-10-CM

## 2015-11-26 MED ORDER — LEVALBUTEROL HCL 0.63 MG/3ML IN NEBU
0.6300 mg | INHALATION_SOLUTION | Freq: Once | RESPIRATORY_TRACT | Status: AC
  Administered 2015-11-26: 0.63 mg via RESPIRATORY_TRACT

## 2015-11-26 MED ORDER — DEXLANSOPRAZOLE 60 MG PO CPDR: 60.0000 mg | DELAYED_RELEASE_CAPSULE | Freq: Every day | ORAL | Status: DC

## 2015-11-26 NOTE — Patient Instructions (Signed)
Your EKG, chest x-ray and breathing evaluations are normal.

## 2015-11-26 NOTE — Progress Notes (Signed)
Subjective:  Patient ID: Brendan Macdonald, male    DOB: [birth year]  Age: 45 y.o. MRN: [id number]  CC: er follow up   HPI Brendan Macdonald presents for Sudden onset of chest pain describes as a tightness. Also noted in the ER to have some cough on that report. Patient says he feels trunk as well. The onset was the same for all 3 approximately 3-1/2 days ago. 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 Currently symptoms Are continuous but intermittent. There is a sharp substernal intermittent pain as well as a tightness all across the anterior chest. He continues to have some cough and sinus drainage. Of note is that he had negative enzymes and normal EKG in the emergency department.  Of note is that he does have risk factors for heart disease in his father, hypertension, smoking and obesity as well.    History Jemuel has a past medical history of GERD (gastroesophageal reflux disease); Complication of anesthesia; Arthritis; and History of chicken pox.   He has past surgical history that includes orthopedic surgery; Back surgery; Arthroscopy  (Bilateral); Rotator cuff repair (Right); Cholecystectomy (N/A, 02/24/2013); Quadriceps tendon repair (Right, 10/18/2014); Knee arthroscopy with patellar tendon repair (Right, 03/23/2015); Exam under anesthesia with manipulation of knee (Right, 03/23/2015); and Lysis of adhesion (Right, 03/23/2015).   His family history includes Cancer in his mother; Diabetes in his mother; Heart disease in his father; Hypertension in his father.He reports that he quit smoking about 3 years ago. His smoking use included Cigarettes and Cigars. He has a 15 pack-year smoking history. His smokeless tobacco use includes Snuff. He reports that he drinks alcohol. He reports that he does not use illicit drugs.    ROS Review of Systems  Constitutional: Negative for fever, chills, diaphoresis and unexpected weight change.    HENT: Positive for congestion, postnasal drip and rhinorrhea. Negative for hearing loss and sore throat.   Eyes: Negative for visual disturbance.  Respiratory: Positive for cough, chest tightness and shortness of breath.   Cardiovascular: Positive for chest pain.  Gastrointestinal: Negative for abdominal pain, diarrhea and constipation.  Genitourinary: Negative for dysuria and flank pain.  Musculoskeletal: Negative for joint swelling and arthralgias.  Skin: Negative for rash.  Neurological: Negative for dizziness and headaches.  Psychiatric/Behavioral: Negative for sleep disturbance and dysphoric mood.    Objective:  BP 136/79 mmHg  Pulse 93  Temp(Src) 97 F (36.1 C) (Oral)  Ht 6' 3.5" (1.918 m)  Wt 288 lb 6.4 oz (130.817 kg)  BMI 35.56 kg/m2  SpO2 96%  BP Readings from Last 3 Encounters:  11/26/15 136/79  11/24/15 123/99  03/23/15 135/83    Wt Readings from Last 3 Encounters:  11/26/15 288 lb 6.4 oz (130.817 kg)  11/23/15 284 lb (128.822 kg)  03/23/15 278 lb (126.1 kg)     Physical Exam  Constitutional: He is oriented to person, place, and time. He appears well-developed and well-nourished. No distress.  HENT:  Head: Normocephalic and atraumatic.  Right Ear: External ear normal.  Left Ear: External ear normal.  Nose: Nose normal.  Mouth/Throat: Oropharynx is clear and moist.  Eyes: Conjunctivae and EOM are normal. Pupils are equal, round, and reactive to light.  Neck: Normal range of motion. Neck supple. No thyromegaly present.  Cardiovascular: Normal rate, regular rhythm and normal heart sounds.   No murmur heard. Pulmonary/Chest: Effort normal and breath sounds normal. No respiratory distress.  He has no wheezes. He has no rales.  Abdominal: Soft. Bowel sounds are normal. He exhibits no distension. There is no tenderness.  Lymphadenopathy:    He has no cervical adenopathy.  Neurological: He is alert and oriented to person, place, and time. He has normal  reflexes.  Skin: Skin is warm and dry.  Psychiatric: He has a normal mood and affect. His behavior is normal. Judgment and thought content normal.     Lab Results  Component Value Date   WBC 8.6 11/24/2015   HGB 14.4 11/24/2015   HCT 41.9 11/24/2015   PLT 250 11/24/2015   GLUCOSE 135* 11/24/2015   ALT 27 10/22/2013   AST 21 10/22/2013   NA 137 11/24/2015   K 3.9 11/24/2015   CL 102 11/24/2015   CREATININE 1.11 11/24/2015   BUN 12 11/24/2015   CO2 25 11/24/2015   TSH 0.973 10/05/2013   PSA 0.6 10/05/2013   INR 0.98 10/22/2013    No results found.  Assessment & Plan:   Roth was seen today for er follow up.  Diagnoses and all orders for this visit:  Other chest pain -     EKG 12-Lead -     DG Chest 2 View; Future -     CBC with Differential/Platelet -     CMP14+EGFR -     Spirometry: Peak -     Echo stress; Future -     Ambulatory referral to Gastroenterology  Dyspnea -     EKG 12-Lead -     DG Chest 2 View; Future -     CBC with Differential/Platelet -     CMP14+EGFR -     PR EVAL OF BRONCHOSPASM -     levalbuterol (XOPENEX) nebulizer solution 0.63 mg; Take 3 mLs (0.63 mg total) by nebulization once. -     Spirometry: Peak -     Echo stress; Future  Dizziness -     EKG 12-Lead -     DG Chest 2 View; Future -     CBC with Differential/Platelet -     CMP14+EGFR -     Spirometry: Peak  Other orders -     dexlansoprazole (DEXILANT) 60 MG capsule; Take 1 capsule (60 mg total) by mouth daily.    I am having Mr. Beswick start on dexlansoprazole. I am also having him maintain his fexofenadine-pseudoephedrine, fluticasone, aspirin EC, Homeopathic Products (ZICAM ALLERGY RELIEF NA), and ranitidine. We administered levalbuterol.  Meds ordered this encounter  Medications  . levalbuterol (XOPENEX) nebulizer solution 0.63 mg    Sig:   . dexlansoprazole (DEXILANT) 60 MG capsule    Sig: Take 1 capsule (60 mg total) by mouth daily.    Dispense:  30 capsule     Refill:  5     Follow-up: Return if symptoms worsen or fail to improve.  Claretta Fraise, M.D.

## 2015-11-27 ENCOUNTER — Encounter: Payer: Self-pay | Admitting: Gastroenterology

## 2015-12-04 ENCOUNTER — Telehealth: Payer: Self-pay

## 2015-12-04 MED ORDER — LANSOPRAZOLE 30 MG PO CPDR: 30.0000 mg | DELAYED_RELEASE_CAPSULE | Freq: Two times a day (BID) | ORAL | Status: DC

## 2015-12-04 NOTE — Telephone Encounter (Signed)
Insurance denied prior authorization for [employer]    Dexilant is subject to step therapy which requires a trial of generic drugs before brand name   They do not list which ones

## 2015-12-04 NOTE — Telephone Encounter (Signed)
The requested substitution has been sent to the pharmacy.  Please let the patient know. Thanks, WS 

## 2016-01-28 ENCOUNTER — Other Ambulatory Visit: Payer: Self-pay

## 2016-01-28 ENCOUNTER — Ambulatory Visit: Payer: BLUE CROSS/BLUE SHIELD | Admitting: Gastroenterology

## 2016-04-11 ENCOUNTER — Telehealth: Payer: Self-pay | Admitting: Family Medicine

## 2016-06-29 IMAGING — DX DG CHEST 2V
3 series · 3 of 3 positions shown · non-contrast
Comparison: Chest radiograph from 10/22/2013

CLINICAL DATA: Acute onset of intermittent central chest pain and
tightness. Shortness of breath and productive cough. Mild dizziness.
Initial encounter.

EXAM:
CHEST  2 VIEW

[chest pa (1 of 2)]
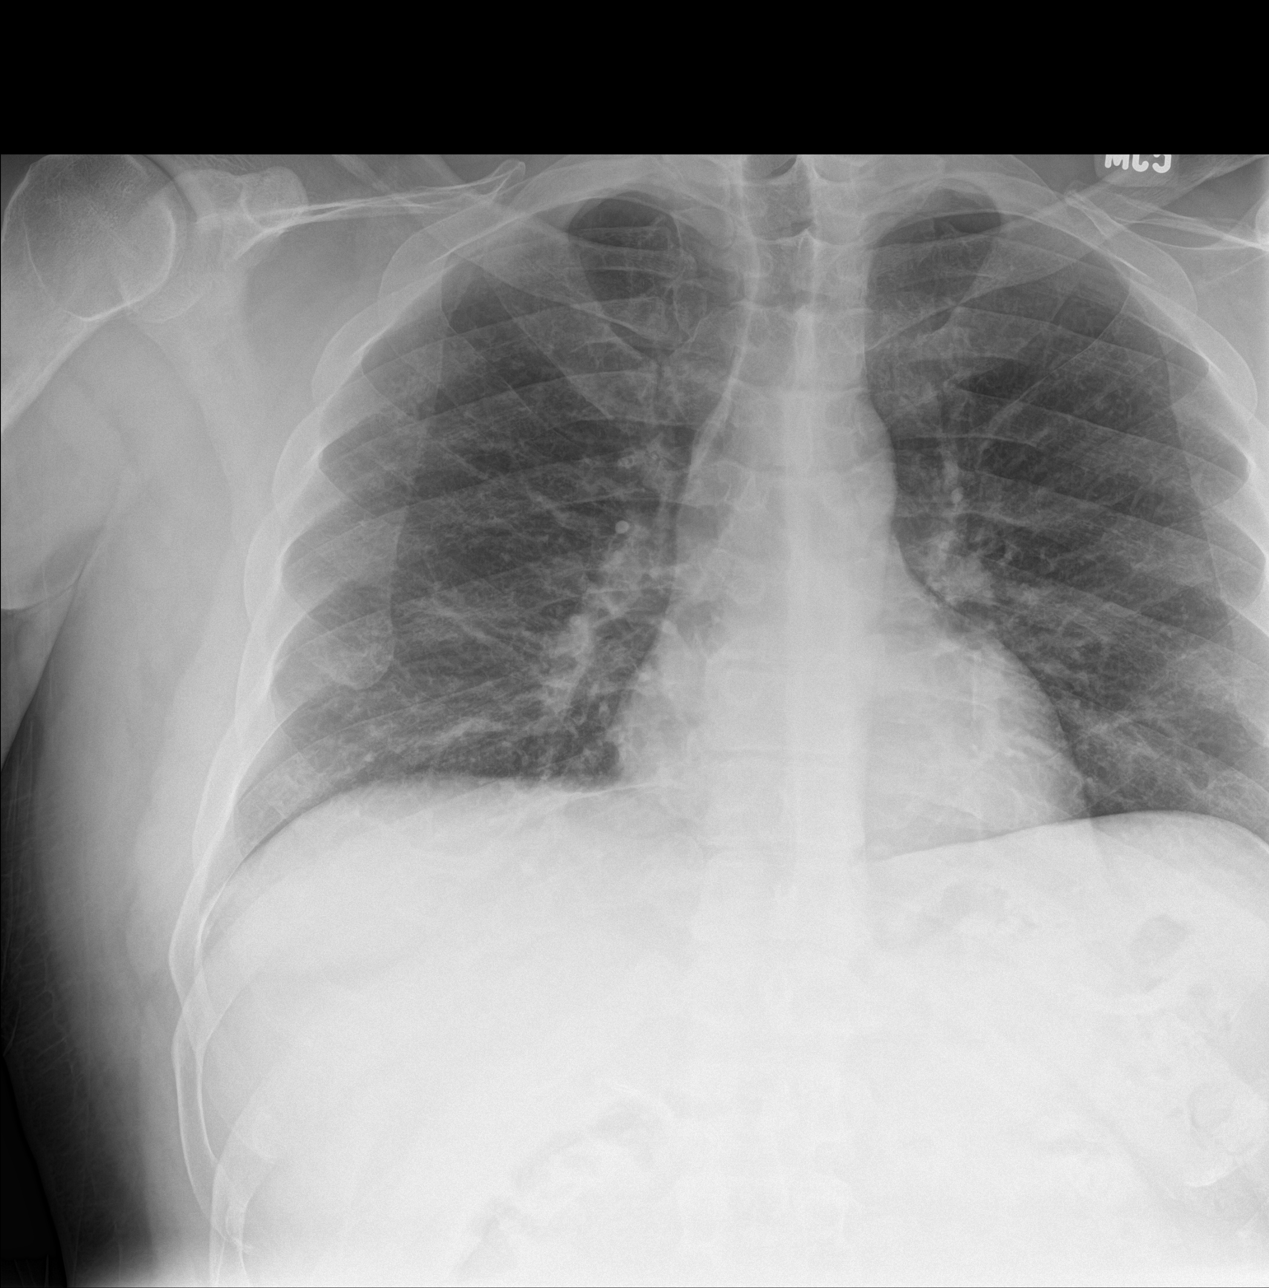

[chest lat]
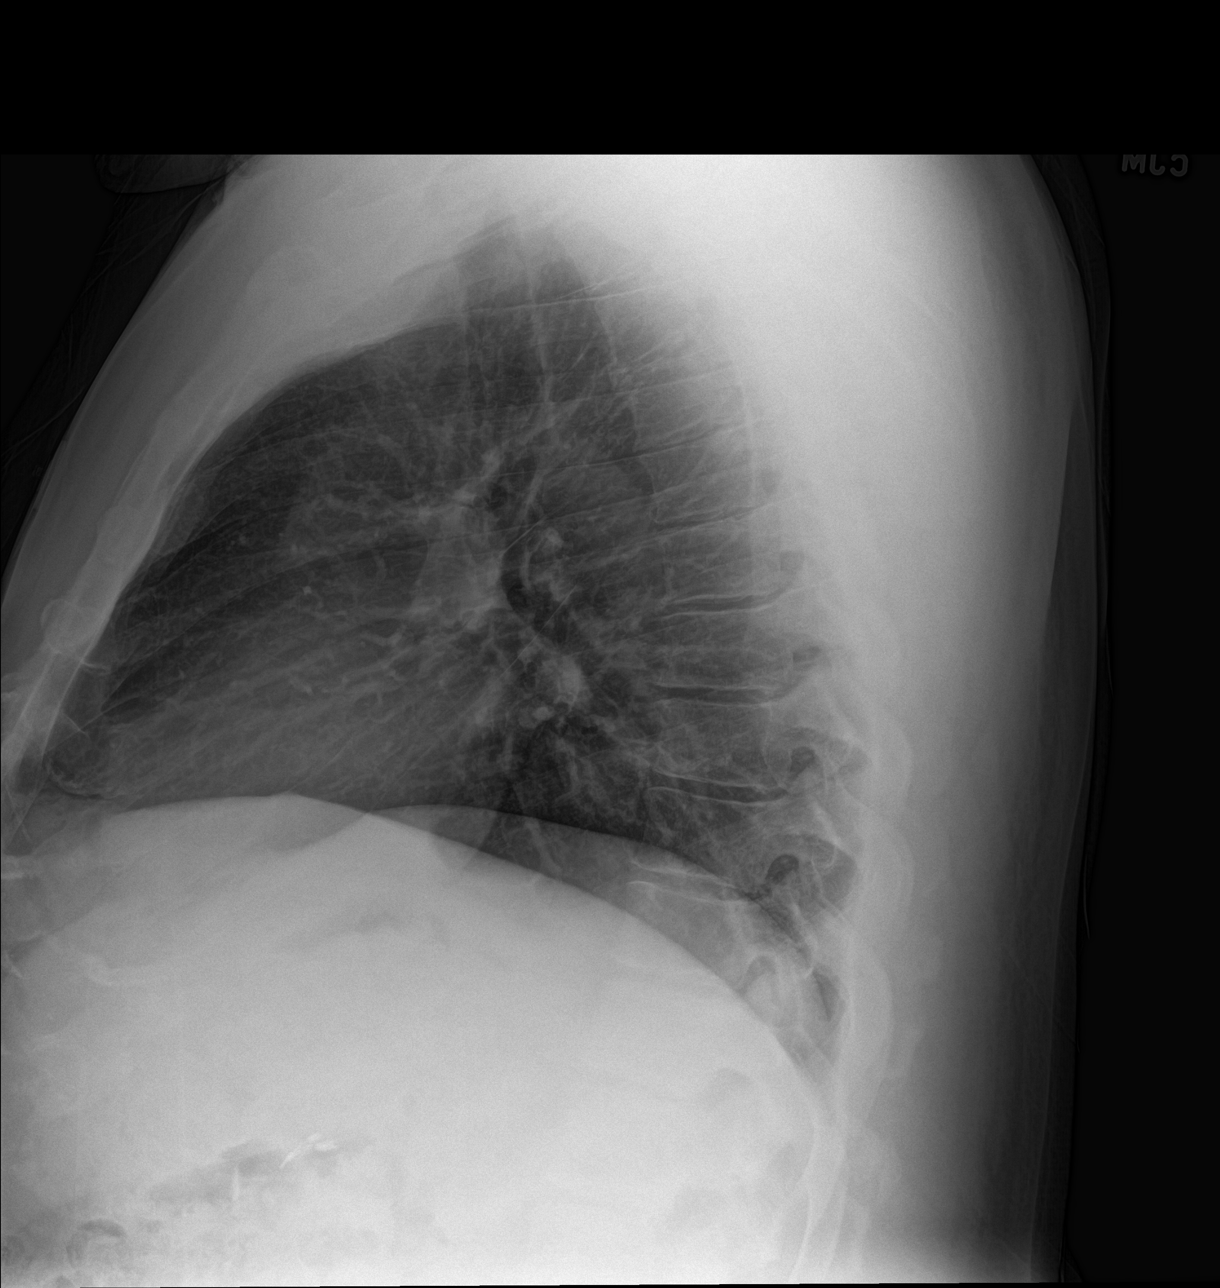

[chest pa (2 of 2)]
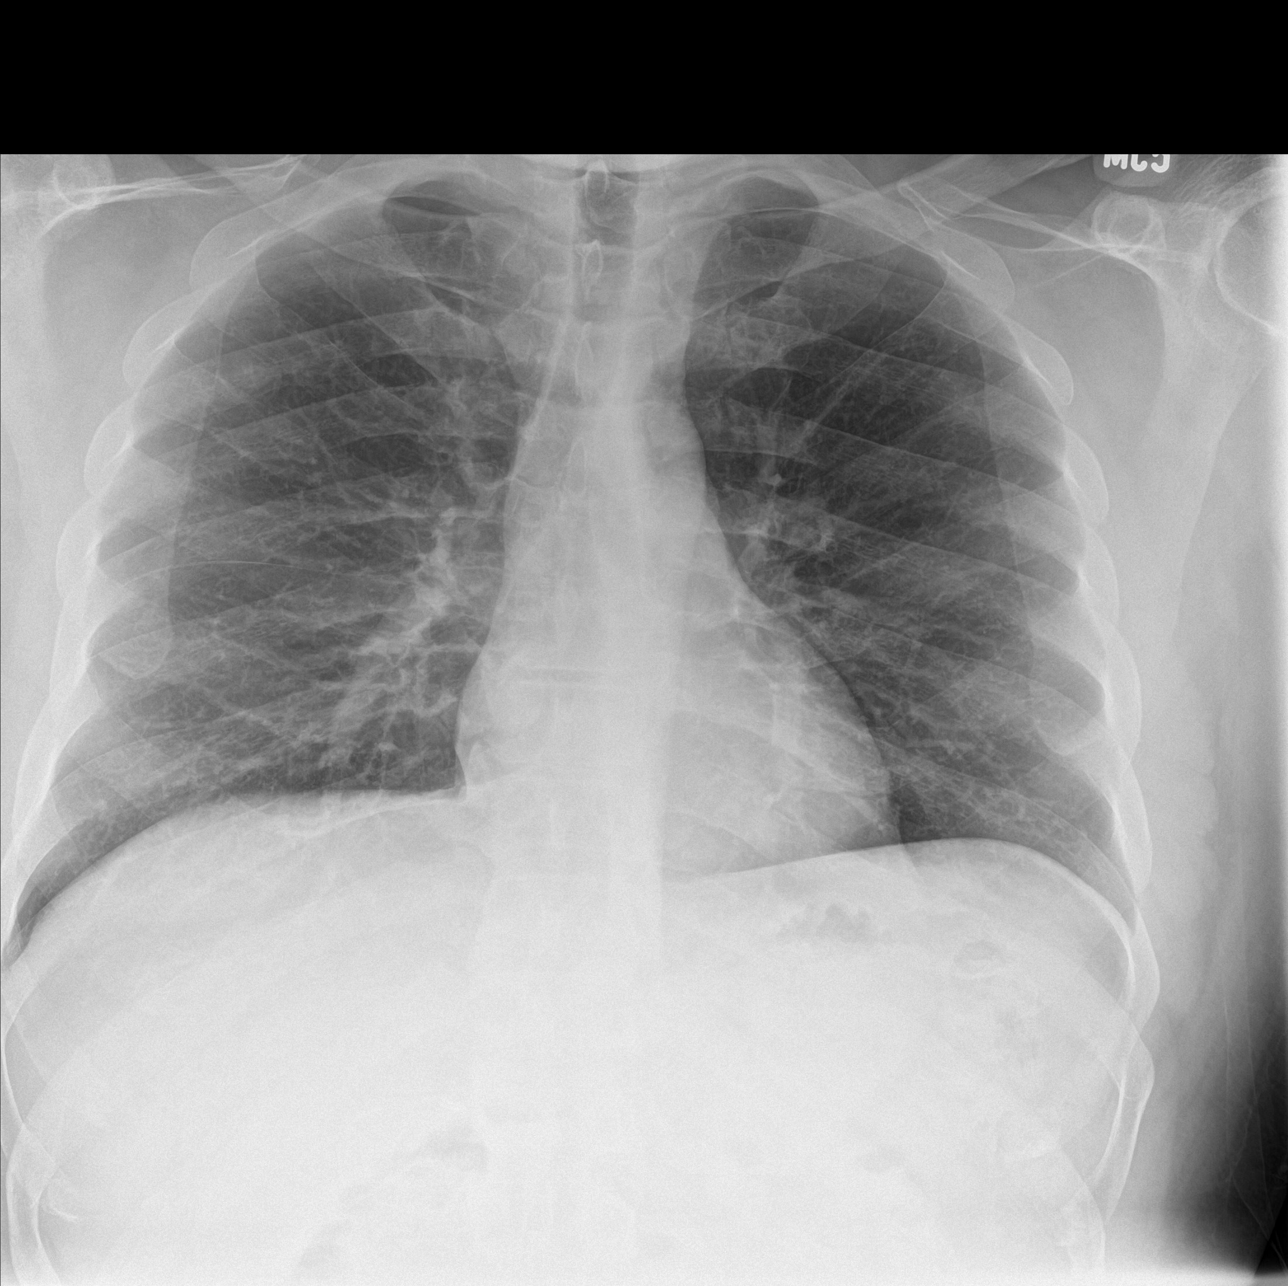

[3 of 3 positions shown; findings below may reference images not displayed]

FINDINGS: The lungs are well-aerated. Mild peribronchial thickening is noted.
There is no evidence of focal opacification, pleural effusion or
pneumothorax.

The heart is normal in size; the mediastinal contour is within
normal limits. No acute osseous abnormalities are seen. Clips are
noted within the right upper quadrant, reflecting prior
cholecystectomy.
IMPRESSION: Mild peribronchial thickening noted.  Lungs remain grossly clear.

## 2017-02-16 ENCOUNTER — Ambulatory Visit: Payer: BLUE CROSS/BLUE SHIELD | Admitting: Family Medicine

## 2017-11-28 ENCOUNTER — Ambulatory Visit (INDEPENDENT_AMBULATORY_CARE_PROVIDER_SITE_OTHER): Payer: 59 | Admitting: Physician Assistant

## 2017-11-28 ENCOUNTER — Encounter: Payer: Self-pay | Admitting: Physician Assistant

## 2017-11-28 ENCOUNTER — Telehealth: Payer: Self-pay | Admitting: Family Medicine

## 2017-11-28 VITALS — BP 113/71 | HR 96 | Temp 96.7°F | Ht 75.5 in | Wt 279.0 lb

## 2017-11-28 DIAGNOSIS — J40 Bronchitis, not specified as acute or chronic: Secondary | ICD-10-CM

## 2017-11-28 DIAGNOSIS — B353 Tinea pedis: Secondary | ICD-10-CM

## 2017-11-28 DIAGNOSIS — M533 Sacrococcygeal disorders, not elsewhere classified: Secondary | ICD-10-CM

## 2017-11-28 DIAGNOSIS — J309 Allergic rhinitis, unspecified: Secondary | ICD-10-CM

## 2017-11-28 MED ORDER — ECONAZOLE NITRATE 1 % EX CREA: TOPICAL_CREAM | Freq: Every day | CUTANEOUS | 0 refills | Status: DC

## 2017-11-28 MED ORDER — TERBINAFINE HCL 250 MG PO TABS: 250.0000 mg | ORAL_TABLET | Freq: Every day | ORAL | 2 refills | Status: DC

## 2017-11-28 MED ORDER — CEFDINIR 300 MG PO CAPS: 300.0000 mg | ORAL_CAPSULE | Freq: Two times a day (BID) | ORAL | 0 refills | Status: DC

## 2017-11-28 MED ORDER — METHYLPREDNISOLONE ACETATE 80 MG/ML IJ SUSP
80.0000 mg | Freq: Once | INTRAMUSCULAR | Status: AC
  Administered 2017-11-28: 80 mg via INTRAMUSCULAR

## 2017-11-28 MED ORDER — ALBUTEROL SULFATE 108 (90 BASE) MCG/ACT IN AEPB: 2.0000 | INHALATION_SPRAY | Freq: Four times a day (QID) | RESPIRATORY_TRACT | 0 refills | Status: DC

## 2017-11-28 MED ORDER — FEXOFENADINE-PSEUDOEPHED ER 180-240 MG PO TB24: 1.0000 | ORAL_TABLET | Freq: Every day | ORAL | 11 refills | Status: AC

## 2017-11-28 MED ORDER — DICLOFENAC SODIUM 75 MG PO TBEC: 75.0000 mg | DELAYED_RELEASE_TABLET | Freq: Two times a day (BID) | ORAL | 5 refills | Status: DC

## 2017-11-28 NOTE — Telephone Encounter (Signed)
Angel to send - pt aware

## 2017-11-28 NOTE — Patient Instructions (Addendum)
In a few days you may receive a survey in the mail or online from [employer] regarding your visit with us today. Please take a moment to fill this out. Your feedback is very important to our whole office. It can help us better understand your needs as well as improve your experience and satisfaction. Thank you for taking your time to complete it. We care about you.  Tyeisha Dinan, PA-C  Back Exercises If you have pain in your back, do these exercises 2-3 times each day or as told by your doctor. When the pain goes away, do the exercises once each day, but repeat the steps more times for each exercise (do more repetitions). If you do not have pain in your back, do these exercises once each day or as told by your doctor. Exercises Single Knee to Chest  Do these steps 3-5 times in a row for each leg: 1. Lie on your back on a firm bed or the floor with your legs stretched out. 2. Bring one knee to your chest. 3. Hold your knee to your chest by grabbing your knee or thigh. 4. Pull on your knee until you feel a gentle stretch in your lower back. 5. Keep doing the stretch for 10-30 seconds. 6. Slowly let go of your leg and straighten it.  Pelvic Tilt  Do these steps 5-10 times in a row: 1. Lie on your back on a firm bed or the floor with your legs stretched out. 2. Bend your knees so they point up to the ceiling. Your feet should be flat on the floor. 3. Tighten your lower belly (abdomen) muscles to press your lower back against the floor. This will make your tailbone point up to the ceiling instead of pointing down to your feet or the floor. 4. Stay in this position for 5-10 seconds while you gently tighten your muscles and breathe evenly.  Cat-Cow  Do these steps until your lower back bends more easily: 1. Get on your hands and knees on a firm surface. Keep your hands under your shoulders, and keep your knees under your hips. You may put padding under your knees. 2. Let your head hang down,  and make your tailbone point down to the floor so your lower back is round like the back of a cat. 3. Stay in this position for 5 seconds. 4. Slowly lift your head and make your tailbone point up to the ceiling so your back hangs low (sags) like the back of a cow. 5. Stay in this position for 5 seconds.  Press-Ups  Do these steps 5-10 times in a row: 1. Lie on your belly (face-down) on the floor. 2. Place your hands near your head, about shoulder-width apart. 3. While you keep your back relaxed and keep your hips on the floor, slowly straighten your arms to raise the top half of your body and lift your shoulders. Do not use your back muscles. To make yourself more comfortable, you may change where you place your hands. 4. Stay in this position for 5 seconds. 5. Slowly return to lying flat on the floor.  Bridges  Do these steps 10 times in a row: 1. Lie on your back on a firm surface. 2. Bend your knees so they point up to the ceiling. Your feet should be flat on the floor. 3. Tighten your butt muscles and lift your butt off of the floor until your waist is almost as high as your knees. If   you do not feel the muscles working in your butt and the back of your thighs, slide your feet 1-2 inches farther away from your butt. 4. Stay in this position for 3-5 seconds. 5. Slowly lower your butt to the floor, and let your butt muscles relax.  If this exercise is too easy, try doing it with your arms crossed over your chest. Belly Crunches  Do these steps 5-10 times in a row: 1. Lie on your back on a firm bed or the floor with your legs stretched out. 2. Bend your knees so they point up to the ceiling. Your feet should be flat on the floor. 3. Cross your arms over your chest. 4. Tip your chin a little bit toward your chest but do not bend your neck. 5. Tighten your belly muscles and slowly raise your chest just enough to lift your shoulder blades a tiny bit off of the floor. 6. Slowly lower  your chest and your head to the floor.  Back Lifts Do these steps 5-10 times in a row: 1. Lie on your belly (face-down) with your arms at your sides, and rest your forehead on the floor. 2. Tighten the muscles in your legs and your butt. 3. Slowly lift your chest off of the floor while you keep your hips on the floor. Keep the back of your head in line with the curve in your back. Look at the floor while you do this. 4. Stay in this position for 3-5 seconds. 5. Slowly lower your chest and your face to the floor.  Contact a doctor if:  Your back pain gets a lot worse when you do an exercise.  Your back pain does not lessen 2 hours after you exercise. If you have any of these problems, stop doing the exercises. Do not do them again unless your doctor says it is okay. Get help right away if:  You have sudden, very bad back pain. If this happens, stop doing the exercises. Do not do them again unless your doctor says it is okay. This information is not intended to replace advice given to you by your health care provider. Make sure you discuss any questions you have with your health care provider. Document Released: 09/13/2010 Document Revised: 01/17/2016 Document Reviewed: 10/05/2014 Elsevier Interactive Patient Education  2018 [employer] Inc[employer]  

## 2017-11-29 NOTE — Progress Notes (Signed)
BP 113/71 (BP Location: Left Arm)   Pulse 96   Temp (!) 96.7 F (35.9 C) (Oral)   Ht 6' 3.5" (1.918 m)   Wt 279 lb (126.6 kg)   BMI 34.41 kg/m    Subjective:    Patient ID: Brendan Macdonald, male    DOB: Feb 14, 1971, 47 y.o.   MRN: [id number]  HPI: Brendan Macdonald is a 47 y.o. male presenting on 11/28/2017 for Cough (congestion, ears achy and stopped up  - started last sat ); feet are peeling; and Back Pain (left side )  Patient has not been here in a few years, unable to see his provider. Seen in our office today for the above.  Patient with several days of progressing upper respiratory and bronchial symptoms. Initially there was more upper respiratory congestion. This progressed to having significant cough that is productive throughout the day and severe at night. There is occasional wheezing after coughing. Sometimes there is slight dyspnea on exertion. It is productive mucus that is yellow in color. Denies any blood.  Bad pain at the left SI joint, Comes and  Goes, no specific injury, At times is very tight.    Has recurrence of severe athletes foot and peeling with pain. Feet itch and nails are thickened.  Past Medical History:  Diagnosis Date  . Arthritis   . Complication of anesthesia    years ago used to hyperventilate no recent problems   . GERD (gastroesophageal reflux disease)   . History of chicken pox    Relevant past medical, surgical, family and social history reviewed and updated as indicated. Interim medical history since our last visit reviewed. Allergies and medications reviewed and updated. DATA REVIEWED: CHART IN EPIC  Family History reviewed for pertinent findings.  Review of Systems  Constitutional: Positive for fatigue. Negative for appetite change.  HENT: Positive for sinus pressure and sore throat.   Eyes: Negative.  Negative for pain and visual disturbance.  Respiratory: Positive for cough, shortness of breath and wheezing. Negative for chest  tightness.   Cardiovascular: Negative.  Negative for chest pain, palpitations and leg swelling.  Gastrointestinal: Negative.  Negative for abdominal pain, diarrhea, nausea and vomiting.  Endocrine: Negative.   Genitourinary: Negative.   Musculoskeletal: Positive for arthralgias, back pain and myalgias.  Skin: Positive for rash. Negative for color change.  Neurological: Positive for headaches. Negative for weakness and numbness.  Psychiatric/Behavioral: Negative.     Allergies as of 11/28/2017      Reactions   Oxycodone Shortness Of Breath, Other (See Comments)   Hallucinations   Vicodin [hydrocodone-acetaminophen] Shortness Of Breath, Other (See Comments)   Hallucinations      Medication List        Accurate as of 11/28/17 11:59 PM. Always use your most recent med list.          Albuterol Sulfate 108 (90 Base) MCG/ACT Aepb Inhale 2 Inhalers into the lungs 4 (four) times daily.   cefdinir 300 MG capsule Commonly known as:  OMNICEF Take 1 capsule (300 mg total) by mouth 2 (two) times daily. 1 po BID   diclofenac 75 MG EC tablet Commonly known as:  VOLTAREN Take 1 tablet (75 mg total) by mouth 2 (two) times daily.   econazole nitrate 1 % cream Apply topically daily.   fexofenadine-pseudoephedrine 180-240 MG 24 hr tablet Commonly known as:  ALLEGRA-D 24 Take 1 tablet by mouth daily.   fluticasone 50 MCG/ACT nasal spray Commonly known as:  FLONASE  Place 2 sprays into both nostrils daily.   ranitidine 150 MG tablet Commonly known as:  ZANTAC Take 1 tablet (150 mg total) by mouth 2 (two) times daily.   terbinafine 250 MG tablet Commonly known as:  LAMISIL Take 1 tablet (250 mg total) by mouth daily.          Objective:    BP 113/71 (BP Location: Left Arm)   Pulse 96   Temp (!) 96.7 F (35.9 C) (Oral)   Ht 6' 3.5" (1.918 m)   Wt 279 lb (126.6 kg)   BMI 34.41 kg/m   Allergies  Allergen Reactions  . Oxycodone Shortness Of Breath and Other (See Comments)     Hallucinations  . Vicodin [Hydrocodone-Acetaminophen] Shortness Of Breath and Other (See Comments)    Hallucinations    Wt Readings from Last 3 Encounters:  11/28/17 279 lb (126.6 kg)  11/26/15 288 lb 6.4 oz (130.8 kg)  11/23/15 284 lb (128.8 kg)    Physical Exam  Constitutional: He appears well-developed and well-nourished.  HENT:  Head: Normocephalic and atraumatic.  Right Ear: Hearing and tympanic membrane normal.  Left Ear: Hearing and tympanic membrane normal.  Nose: Mucosal edema and sinus tenderness present. No nasal deformity. Right sinus exhibits frontal sinus tenderness. Left sinus exhibits frontal sinus tenderness.  Mouth/Throat: Posterior oropharyngeal erythema present.  Eyes: Pupils are equal, round, and reactive to light. Conjunctivae and EOM are normal. Right eye exhibits no discharge. Left eye exhibits no discharge.  Neck: Normal range of motion. Neck supple.  Cardiovascular: Normal rate, regular rhythm and normal heart sounds.  Pulmonary/Chest: Effort normal. No respiratory distress. He has no decreased breath sounds. He has wheezes. He has no rhonchi. He has no rales.  Abdominal: Soft. Bowel sounds are normal.  Musculoskeletal:       Lumbar back: He exhibits decreased range of motion and tenderness.       Back:  Skin: Skin is warm and dry.        Assessment & Plan:   1. Allergic rhinitis - fexofenadine-pseudoephedrine (ALLEGRA-D 24) 180-240 MG 24 hr tablet; Take 1 tablet by mouth daily.  Dispense: 30 tablet; Refill: 11  2. Bronchitis - methylPREDNISolone acetate (DEPO-MEDROL) injection 80 mg - cefdinir (OMNICEF) 300 MG capsule; Take 1 capsule (300 mg total) by mouth 2 (two) times daily. 1 po BID  Dispense: 20 capsule; Refill: 0 - Albuterol Sulfate 108 (90 Base) MCG/ACT AEPB; Inhale 2 Inhalers into the lungs 4 (four) times daily.  Dispense: 1 each; Refill: 0  3. Tinea pedis of both feet - terbinafine (LAMISIL) 250 MG tablet; Take 1 tablet (250 mg total) by  mouth daily.  Dispense: 30 tablet; Refill: 2 - econazole nitrate 1 % cream; Apply topically daily.  Dispense: 30 g; Refill: 0  4. Sacroiliac joint dysfunction - diclofenac (VOLTAREN) 75 MG EC tablet; Take 1 tablet (75 mg total) by mouth 2 (two) times daily.  Dispense: 60 tablet; Refill: 5   Continue all other maintenance medications as listed above.  Follow up plan: Return if symptoms worsen or fail to improve, for follow up with PCP.  Educational handout given for survey  Remus LofflerAngel S. Roselia Snipe PA-C [facility] [address]  [place], [place] [id number] [phone]   11/29/2017, 9:26 PM

## 2019-03-28 ENCOUNTER — Ambulatory Visit (INDEPENDENT_AMBULATORY_CARE_PROVIDER_SITE_OTHER): Payer: 59 | Admitting: Family Medicine

## 2019-03-28 ENCOUNTER — Encounter: Payer: Self-pay | Admitting: Family Medicine

## 2019-03-28 VITALS — BP 118/70 | HR 95 | Temp 97.8°F | Resp 12 | Ht 75.5 in | Wt 260.0 lb

## 2019-03-28 DIAGNOSIS — Z13228 Encounter for screening for other metabolic disorders: Secondary | ICD-10-CM | POA: Diagnosis not present

## 2019-03-28 DIAGNOSIS — Z130 Encounter for screening for diseases of the blood and blood-forming organs and certain disorders involving the immune mechanism: Secondary | ICD-10-CM

## 2019-03-28 DIAGNOSIS — Z1329 Encounter for screening for other suspected endocrine disorder: Secondary | ICD-10-CM | POA: Diagnosis not present

## 2019-03-28 DIAGNOSIS — Z23 Encounter for immunization: Secondary | ICD-10-CM

## 2019-03-28 DIAGNOSIS — R351 Nocturia: Secondary | ICD-10-CM

## 2019-03-28 DIAGNOSIS — N50811 Right testicular pain: Secondary | ICD-10-CM

## 2019-03-28 DIAGNOSIS — R634 Abnormal weight loss: Secondary | ICD-10-CM

## 2019-03-28 DIAGNOSIS — K219 Gastro-esophageal reflux disease without esophagitis: Secondary | ICD-10-CM

## 2019-03-28 DIAGNOSIS — Z0000 Encounter for general adult medical examination without abnormal findings: Secondary | ICD-10-CM | POA: Diagnosis not present

## 2019-03-28 DIAGNOSIS — J309 Allergic rhinitis, unspecified: Secondary | ICD-10-CM | POA: Diagnosis not present

## 2019-03-28 DIAGNOSIS — E1165 Type 2 diabetes mellitus with hyperglycemia: Secondary | ICD-10-CM

## 2019-03-28 DIAGNOSIS — E669 Obesity, unspecified: Secondary | ICD-10-CM | POA: Insufficient documentation

## 2019-03-28 MED ORDER — PANTOPRAZOLE SODIUM 40 MG PO TBEC: 40.0000 mg | DELAYED_RELEASE_TABLET | Freq: Every day | ORAL | 3 refills | Status: DC

## 2019-03-28 MED ORDER — MOMETASONE FUROATE 50 MCG/ACT NA SUSP: 2.0000 | Freq: Every day | NASAL | 3 refills | Status: DC

## 2019-03-28 NOTE — Patient Instructions (Addendum)
Health Maintenance Due  Topic Date Due  . HIV Screening  04/25/1987  . TETANUS/TDAP  04/25/1991  . INFLUENZA VACCINE  03/26/2019    Depression screen [facility] 2/9 03/28/2019 11/28/2017 11/26/2015  Decreased Interest 0 0 0  Down, Depressed, Hopeless 0 0 0  PHQ - 2 Score 0 0 0   A few things to remember from today's visit:   Routine general medical examination at a health care facility  Nocturia - Plan: Urinalysis, Routine w reflex microscopic, PSA  Weight loss, non-intentional - Plan: Hemoglobin A1c, Comprehensive metabolic panel, CBC with Differential/Platelet, TSH, HIV antibody  Testicular pain, right - Plan: US SCROTUM W/DOPPLER  Screening for endocrine, metabolic and immunity disorder  Nasonex added today. Nasal irrigation with saline as needed.   At least 150 minutes of moderate exercise per week, daily brisk walking for 15-30 min is a good exercise option. Healthy diet low in saturated (animal) fats and sweets and consisting of fresh fruits and vegetables, lean meats such as fish and white chicken and whole grains.  - Vaccines:  Tdap vaccine every 10 years.  Shingles vaccine recommended at age 84, could be given after 47 years of age but not sure about insurance coverage.  Pneumonia vaccines:  Prevnar 78 at 25 and Pneumovax at 67.   -Screening recommendations for low/normal risk males:  Screening for diabetes at age 46 and every 3 years. Earlier screening if cardiovascular risk factors.   Lipid screening at 35 and every 3 years. Screening starts in younger males with cardiovascular risk factors.  Colon cancer screening at age 25 and until age 15.  Prostate cancer screening: some controversy, starts usually at 28: Rectal exam and PSA.  Aortic Abdominal Aneurism once between 66 and 20 years old if ever smoker.  Also recommended:  1. Dental visit- Brush and floss your teeth twice daily; visit your dentist twice a year. 2. Eye doctor- Get an eye exam at least every 2  years. 3. Helmet use- Always wear a helmet when riding a bicycle, motorcycle, rollerblading or skateboarding. 4. Safe sex- If you may be exposed to sexually transmitted infections, use a condom. 5. Seat belts- Seat belts can save your live; always wear one. 6. Smoke/Carbon Monoxide detectors- These detectors need to be installed on the appropriate level of your home. Replace batteries at least once a year. 7. Skin cancer- When out in the sun please cover up and use sunscreen 15 SPF or higher. 8. Violence- If anyone is threatening or hurting you, please tell your healthcare provider.  9. Drink alcohol in moderation- Limit alcohol intake to one drink or less per day. Never drink and drive.  Please be sure medication list is accurate. If a new problem present, please set up appointment sooner than planned today.

## 2019-03-28 NOTE — Assessment & Plan Note (Signed)
Problem is not well controlled. He has tried Nexium and omeprazole with no major benefit, so he agrees with trying Protonix 40 mg daily. GERD precautions discussed. Follow-up in 2 months.

## 2019-03-28 NOTE — Progress Notes (Signed)
HPI:   [name] is a 48 y.o. male, who is here today to establish care. He has several concerns today and also would like a CPE.  Former PCP: Dr Selina CooleyStack Last preventive routine visit: 06/2017  Chronic medical problems: Allergy rhinitis, obesity.  He lives with his wife,her daughter and 2 grandchildren.  Concerns today:  Unintentional weight loss for the past 1 to 2 months. For the past 4-5 months he has decreased carbs intake,drinking more water,and decreased soda intake. 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  Thirsty "all the time." Denies polyphagia.  Cramps intermittently in hands and LE's. This has been going on for 1-2 months. He has not identified alleviating or exacerbating factors.  Increased urinary frequency, nocturia x2. Denies dysuria,gross hematuria,or decreased urine output. He has not identified exacerbating factors. Denies pelvic pain or unusual back pain. Fatigue for a while.  Difficulty falling asleep for at least a year. Negative for witnessed apnea. He has not tried OTC medications. Sleeps about 3-4 hours.  Nasal congestion and postnasal drainage. Denies fever,chills,sore throat, cough,or wheezing.  Mid chest pain with any food intake. + Heartburn. Denies dysphagia,associated diaphoresis,or palpitation. Problem is mainly at night, when lying down. + Former smoker.  Tums and took Zantac in the past,neither one help. Stool looks like "pudding" after he underwent cholecystectomy.  Denies abdominal pain, nausea, vomiting, changes in bowel habits, blood in stool or melena.   Review of Systems  Constitutional: Positive for fatigue. Negative for activity change and appetite change.  HENT: Positive for congestion. Negative for mouth sores, rhinorrhea, trouble swallowing and voice change.   Eyes: Negative for redness and visual disturbance.  Respiratory: Negative for stridor.   Gastrointestinal: Negative for  abdominal pain, diarrhea and vomiting.  Endocrine: Positive for polydipsia. Negative for cold intolerance, heat intolerance, polyphagia and polyuria.  Genitourinary: Negative for decreased urine volume, difficulty urinating, discharge, flank pain and testicular pain.  Musculoskeletal: Negative for gait problem, joint swelling and myalgias.  Skin: Negative for rash and wound.  Allergic/Immunologic: Positive for environmental allergies.  Neurological: Negative for syncope, weakness, numbness and headaches.  Hematological: Negative for adenopathy. Does not bruise/bleed easily.  Psychiatric/Behavioral: Positive for sleep disturbance. Negative for confusion. The patient is nervous/anxious.   All other systems reviewed and are negative.  Rest see pertinent positives and negatives per HPI.   Current Outpatient Medications on File Prior to Visit  Medication Sig Dispense Refill  . diclofenac (VOLTAREN) 75 MG EC tablet Take 1 tablet (75 mg total) by mouth 2 (two) times daily. 60 tablet 5  . econazole nitrate 1 % cream Apply topically daily. 30 g 0  . fexofenadine-pseudoephedrine (ALLEGRA-D 24) 180-240 MG 24 hr tablet Take 1 tablet by mouth daily. 30 tablet 11  . fluticasone (FLONASE) 50 MCG/ACT nasal spray Place 2 sprays into both nostrils daily. 16 g 11  . ranitidine (ZANTAC) 150 MG tablet Take 1 tablet (150 mg total) by mouth 2 (two) times daily. 60 tablet 0  . [DISCONTINUED] montelukast (SINGULAIR) 10 MG tablet Take 1 tablet (10 mg total) by mouth at bedtime. (Patient not taking: Reported on 09/27/2014) 30 tablet 11   No current facility-administered medications on file prior to visit.      Past Medical History:  Diagnosis Date  . Allergy   . Arthritis   . Complication of anesthesia    years ago used to hyperventilate no recent problems   . GERD (gastroesophageal reflux disease)   .  History of chicken pox    Allergies  Allergen Reactions  . Oxycodone Shortness Of Breath and Other (See  Comments)    Hallucinations  . Vicodin [Hydrocodone-Acetaminophen] Shortness Of Breath and Other (See Comments)    Hallucinations    Family History  Problem Relation Age of Onset  . Cancer Mother        ovarian and abdomen  . Diabetes Mother   . Hypertension Mother   . Arthritis Mother   . Heart disease Father   . Hypertension Father   . Cancer Father        kidney and bladder   . Arthritis Father   . Hearing loss Father   . Kidney disease Father   . Arthritis Sister   . Stroke Maternal Grandmother   . Stroke Paternal Grandmother   . Arthritis Sister   . Lupus Sister     Social History   Socioeconomic History  . Marital status: Married    Spouse name: Not on file  . Number of children: Not on file  . Years of education: Not on file  . Highest education level: Not on file  Occupational History  . Not on file  Social Needs  . Financial resource strain: Not on file  . Food insecurity    Worry: Not on file    Inability: Not on file  . Transportation needs    Medical: Not on file    Non-medical: Not on file  Tobacco Use  . Smoking status: Former Smoker    Packs/day: 0.75    Years: 20.00    Pack years: 15.00    Types: Cigarettes, Cigars    Quit date: 08/25/2012    Years since quitting: 6.6  . Smokeless tobacco: Former NeurosurgeonUser    Types: Snuff    Quit date: 08/26/2015  Substance and Sexual Activity  . Alcohol use: Yes    Alcohol/week: 3.0 standard drinks    Types: 3 Cans of beer per week    Comment: Occasionally  . Drug use: No  . Sexual activity: Yes    Birth control/protection: Condom  Lifestyle  . Physical activity    Days per week: Not on file    Minutes per session: Not on file  . Stress: Not on file  Relationships  . Social Musicianconnections    Talks on phone: Not on file    Gets together: Not on file    Attends religious service: Not on file    Active member of club or organization: Not on file    Attends meetings of clubs or organizations: Not on file     Relationship status: Not on file  Other Topics Concern  . Not on file  Social History Narrative  . Not on file    Vitals:   03/28/19 1425  BP: 118/70  Pulse: 95  Resp: 12  Temp: 97.8 F (36.6 C)  SpO2: 96%    Body mass index is 32.07 kg/m.   Physical Exam  Nursing note and vitals reviewed. Constitutional: He is oriented to person, place, and time. He appears well-developed. No distress.  HENT:  Head: Atraumatic.  Right Ear: Tympanic membrane, external ear and ear canal normal.  Left Ear: Tympanic membrane, external ear and ear canal normal.  Mouth/Throat: Oropharynx is clear and moist and mucous membranes are normal.  Eyes: Pupils are equal, round, and reactive to light. Conjunctivae and EOM are normal.  Neck: Normal range of motion. No tracheal deviation present. No thyromegaly present.  Cardiovascular: Normal rate and regular rhythm.  No murmur heard. Pulses:      Dorsalis pedis pulses are 2+ on the right side and 2+ on the left side.  Respiratory: Effort normal and breath sounds normal. No respiratory distress.  GI: Soft. He exhibits no mass. There is no hepatomegaly. There is no abdominal tenderness. Hernia confirmed negative in the right inguinal area and confirmed negative in the left inguinal area.  Genitourinary: Right testis shows tenderness. Right testis shows no mass. Left testis shows no mass.  Musculoskeletal:        General: No tenderness or edema.     Comments: No major deformities appreciated and no signs of synovitis.  Lymphadenopathy:    He has no cervical adenopathy.       Right: No inguinal and no supraclavicular adenopathy present.       Left: No inguinal and no supraclavicular adenopathy present.  Neurological: He is alert and oriented to person, place, and time. He has normal strength. No cranial nerve deficit or sensory deficit. Coordination and gait normal.  Reflex Scores:      Bicep reflexes are 2+ on the right side and 2+ on the left side.       Patellar reflexes are 2+ on the right side and 2+ on the left side. Skin: Skin is warm. No erythema.  Psychiatric: His mood appears anxious. Cognition and memory are normal.  Well groomed,good eye contact.    ASSESSMENT AND PLAN:   Mr. Holdan was seen today for establish care, annual exam, fatigue and weight loss.  Diagnoses and all orders for this visit:  Orders Placed This Encounter  Procedures  . US SCROTUM W/DOPPLER  . Tdap vaccine greater than or equal to 7yo IM  . Hemoglobin A1c  . Comprehensive metabolic panel  . CBC with Differential/Platelet  . TSH  . Urinalysis, Routine w reflex microscopic  . PSA  . HIV antibody   Lab Results  Component Value Date   TSH 1.13 03/28/2019   Lab Results  Component Value Date   HGBA1C 12.7 (H) 03/28/2019    Lab Results  Component Value Date   ALT 25 03/28/2019   AST 19 03/28/2019   ALKPHOS 98 03/28/2019   BILITOT 0.4 03/28/2019   Lab Results  Component Value Date   CREATININE 1.19 03/28/2019   BUN 14 03/28/2019   NA 134 (L) 03/28/2019   K 4.0 03/28/2019   CL 97 03/28/2019   CO2 29 03/28/2019     Lab Results  Component Value Date   WBC 7.0 03/28/2019   HGB 13.9 03/28/2019   HCT 41.5 03/28/2019   MCV 82.7 03/28/2019   PLT 271.0 03/28/2019    Routine general medical examination at a health care facility We discussed the importance of regular physical activity and healthy diet for prevention of chronic illness and/or complications. Preventive guidelines reviewed. Vaccination updated. Next CPE in a year.  Nocturia -     Urinalysis, Routine w reflex microscopic -     PSA  Weight loss, non-intentional Possible etiologies discussed Could be related to changes in his diet but also endocrinologic disorder. Further recommendations will be given according to labs/imaging results.  -     Hemoglobin A1c -     Comprehensive metabolic panel -     CBC with Differential/Platelet -     TSH -     HIV antibody   Testicular pain, right On examination today tenderness of right testicle. ? Epidiymis. Further recommendations  will be given according to US results.  -     US SCROTUM W/DOPPLER; Future  Screening for endocrine, metabolic and immunity disorder -     Hemoglobin A1c  Allergic rhinitis, unspecified seasonality, unspecified trigger Still having symptoms. Flonase nasal spray did not help. Nasonex nasal spray recommended. Nasal irrigations with saline as needed. OTC antihistaminic may also help.  -     mometasone (NASONEX) 50 MCG/ACT nasal spray; Place 2 sprays into the nose daily.  Need for Tdap vaccination -     Tdap vaccine greater than or equal to 7yo IM  Gastroesophageal reflux disease, esophagitis presence not specified GERD precautions discussed and recommended. Protonix 40 mg recommended. Some side effects discussed. F/U in 2 months.  -     pantoprazole (PROTONIX) 40 MG tablet; Take 1 tablet (40 mg total) by mouth daily before breakfast.     Return in 2 months (on 05/28/2019) for wt,GERD.     [name]. Swazil[place]Jordan, MD  [facility]. [place] office.

## 2019-03-29 LAB — TSH: TSH: 1.13 u[IU]/mL (ref 0.35–4.50)

## 2019-03-29 LAB — URINALYSIS, ROUTINE W REFLEX MICROSCOPIC
Bilirubin Urine: NEGATIVE
Hgb urine dipstick: NEGATIVE
Ketones, ur: NEGATIVE
Leukocytes,Ua: NEGATIVE
Nitrite: NEGATIVE
RBC / HPF: NONE SEEN (ref 0–?)
Specific Gravity, Urine: 1.02 (ref 1.000–1.030)
Total Protein, Urine: NEGATIVE
Urine Glucose: >=1000 — AB
Urobilinogen, UA: 0.2 (ref 0.0–1.0)
WBC, UA: NONE SEEN (ref 0–?)
pH: 5.5 (ref 5.0–8.0)

## 2019-03-29 LAB — COMPREHENSIVE METABOLIC PANEL
ALT: 25 U/L (ref 0–53)
AST: 19 U/L (ref 0–37)
Albumin: 4.5 g/dL (ref 3.5–5.2)
Alkaline Phosphatase: 98 U/L (ref 39–117)
BUN: 14 mg/dL (ref 6–23)
CO2: 29 mEq/L (ref 19–32)
Calcium: 9.8 mg/dL (ref 8.4–10.5)
Chloride: 97 mEq/L (ref 96–112)
Creatinine, Ser: 1.19 mg/dL (ref 0.40–1.50)
GFR: 79.31 mL/min (ref >60.00)
Glucose, Bld: 315 mg/dL — ABNORMAL HIGH (ref 70–99)
Potassium: 4 mEq/L (ref 3.5–5.1)
Sodium: 134 mEq/L — ABNORMAL LOW (ref 135–145)
Total Bilirubin: 0.4 mg/dL (ref 0.2–1.2)
Total Protein: 7.6 g/dL (ref 6.0–8.3)

## 2019-03-29 LAB — CBC WITH DIFFERENTIAL/PLATELET
Basophils Absolute: 0.1 10*3/uL (ref 0.0–0.1)
Basophils Relative: 1.2 % (ref 0.0–3.0)
Eosinophils Absolute: 0.3 10*3/uL (ref 0.0–0.7)
Eosinophils Relative: 4.1 % (ref 0.0–5.0)
HCT: 41.5 % (ref 39.0–52.0)
Hemoglobin: 13.9 g/dL (ref 13.0–17.0)
Lymphocytes Relative: 32.7 % (ref 12.0–46.0)
Lymphs Abs: 2.3 10*3/uL (ref 0.7–4.0)
MCHC: 33.5 g/dL (ref 30.0–36.0)
MCV: 82.7 fl (ref 78.0–100.0)
Monocytes Absolute: 0.3 10*3/uL (ref 0.1–1.0)
Monocytes Relative: 4.5 % (ref 3.0–12.0)
Neutro Abs: 4 10*3/uL (ref 1.4–7.7)
Neutrophils Relative %: 57.5 % (ref 43.0–77.0)
Platelets: 271 10*3/uL (ref 150.0–400.0)
RBC: 5.02 Mil/uL (ref 4.22–5.81)
RDW: 12.9 % (ref 11.5–15.5)
WBC: 7 10*3/uL (ref 4.0–10.5)

## 2019-03-29 LAB — PSA: PSA: 0.48 ng/mL (ref 0.10–4.00)

## 2019-03-29 LAB — HEMOGLOBIN A1C: Hgb A1c MFr Bld: 12.7 % — ABNORMAL HIGH (ref 4.6–6.5)

## 2019-03-29 LAB — HIV ANTIBODY (ROUTINE TESTING W REFLEX): HIV 1&2 Ab, 4th Generation: NONREACTIVE

## 2019-03-31 MED ORDER — METFORMIN HCL 500 MG PO TABS: 500.0000 mg | ORAL_TABLET | Freq: Two times a day (BID) | ORAL | 2 refills | Status: DC

## 2019-03-31 MED ORDER — INSULIN GLARGINE 100 UNIT/ML SOLOSTAR PEN
15.0000 [IU] | PEN_INJECTOR | Freq: Every day | SUBCUTANEOUS | 1 refills | Status: DC
Start: 2019-03-31 — End: 2019-06-02

## 2019-04-05 ENCOUNTER — Telehealth: Payer: Self-pay | Admitting: *Deleted

## 2019-04-05 ENCOUNTER — Telehealth: Payer: Self-pay | Admitting: Family Medicine

## 2019-04-05 ENCOUNTER — Other Ambulatory Visit: Payer: Self-pay | Admitting: *Deleted

## 2019-04-05 MED ORDER — ACCU-CHEK SOFT TOUCH LANCETS MISC: 12 refills | Status: DC

## 2019-04-05 MED ORDER — ACCU-CHEK AVIVA CONNECT W/DEVICE KIT: 1.0000 | PACK | Freq: Every day | 0 refills | Status: DC

## 2019-04-05 NOTE — Telephone Encounter (Signed)
Patient given results and recommendations per Dr. Jordan and verbalized understanding. 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 Patient has scheduled 2 month follow-up. Rx for glucometer sent to the pharmacy 

## 2019-04-05 NOTE — Telephone Encounter (Signed)
Spoke with patient and gave lab results and recommendations per Dr. Martinique. Patient verbalized understanding.  Copied from [place] [id number]. Topic: Quick Communication - Lab Results (Clinic Use ONLY) >> Apr 05, 2019  9:54 AM Zacarias Pontes, CMA wrote: Called patient to inform them of their lab results. When patient returns call, triage nurse may disclose results.

## 2019-04-05 NOTE — Telephone Encounter (Signed)
Copied from [place] [phone]. Topic: Quick Communication - See Telephone Encounter >> Apr 05, 2019 11:31 AM Loma Boston wrote: CRM for notification. See Telephone encounter for: 04/05/19.Notes recorded by Zacarias Pontes, CMA on 04/05/2019 at 9:54 AM EDT  Left message to return call to office for lab results.  ------   Notes recorded by Martinique, [name], MD on 03/31/2019 at 5:23 PM EDT  HgA1C and glucose elevated, DM II. Nutrition and diabetes education referral placed. I also sent Lantus to start 15 U daily at night,same time every day + Metformin with food bid.  Urine,liver test,renal function,thyroid test,prostate test,and blood cells in normal range.  HIV negative.   He needs a glucometer.  Appt in 2 months.

## 2019-04-05 NOTE — Telephone Encounter (Signed)
Copied from [place] [phone]. Topic: Quick Communication - See Telephone Encounter >> Apr 05, 2019 11:31 AM Loma Boston wrote: CRM for notification. See Telephone encounter for: 04/05/19.Notes recorded by Zacarias Pontes, CMA on 04/05/2019 at 9:54 AM EDT  Left message to return call to office for lab results.  ------   Notes recorded by Martinique, [name], MD on 03/31/2019 at 5:23 PM EDT  HgA1C and glucose elevated, DM II. Nutrition and diabetes education referral placed. I also sent Lantus to start 15 U daily at night,same time every day + Metformin with food bid.  Urine,liver test,renal function,thyroid test,prostate test,and blood cells in normal range.  HIV negative.   He needs a glucometer.  Appt in 2 months. >> Apr 05, 2019  1:43 PM Celene Kras A wrote: Pt returning call. Please advise.

## 2019-04-05 NOTE — Telephone Encounter (Signed)
Patient given results and recommendations per Dr. Martinique and verbalized understanding. 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 Patient has scheduled 2 month follow-up. Rx for glucometer sent to the pharmacy

## 2019-04-08 ENCOUNTER — Encounter: Payer: Self-pay | Admitting: *Deleted

## 2019-04-08 ENCOUNTER — Other Ambulatory Visit: Payer: Self-pay | Admitting: *Deleted

## 2019-04-08 MED ORDER — TRIAMCINOLONE ACETONIDE 55 MCG/ACT NA AERO: 2.0000 | INHALATION_SPRAY | Freq: Every day | NASAL | 3 refills | Status: DC

## 2019-04-08 NOTE — Telephone Encounter (Signed)
Patient informed and will let office know about oral medication for DM.

## 2019-04-08 NOTE — Telephone Encounter (Signed)
I recommended insulin because we need to bring glucose down closer to normal in short period of time. Every time glucose is elevated organs (kidney,eye,or nerves) are be damaged. Will he be willing to try oral medication? Janumet XR [phone] mg daily with meal. 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  4 months f/u.  Thanks, BJ

## 2019-04-11 ENCOUNTER — Other Ambulatory Visit: Payer: Self-pay

## 2019-04-11 ENCOUNTER — Ambulatory Visit
Admission: RE | Admit: 2019-04-11 | Discharge: 2019-04-11 | Disposition: A | Discharge status: Home / Self Care | Payer: 59 | Source: Ambulatory Visit | Attending: Family Medicine | Admitting: Family Medicine

## 2019-04-11 DIAGNOSIS — N50811 Right testicular pain: Secondary | ICD-10-CM

## 2019-04-13 ENCOUNTER — Other Ambulatory Visit: Payer: Self-pay | Admitting: *Deleted

## 2019-04-13 DIAGNOSIS — E1165 Type 2 diabetes mellitus with hyperglycemia: Secondary | ICD-10-CM

## 2019-04-13 MED ORDER — ACCU-CHEK GUIDE VI STRP: ORAL_STRIP | 12 refills | Status: DC

## 2019-04-14 ENCOUNTER — Encounter: Payer: Self-pay | Admitting: Family Medicine

## 2019-04-15 ENCOUNTER — Encounter: Payer: Self-pay | Admitting: Family Medicine

## 2019-05-11 ENCOUNTER — Encounter: Payer: Self-pay | Admitting: Family Medicine

## 2019-05-14 ENCOUNTER — Encounter: Payer: Self-pay | Admitting: Family Medicine

## 2019-05-17 ENCOUNTER — Other Ambulatory Visit: Payer: Self-pay | Admitting: Family Medicine

## 2019-05-17 MED ORDER — SITAGLIPTIN PHOSPHATE 100 MG PO TABS: 100.0000 mg | ORAL_TABLET | Freq: Every day | ORAL | 0 refills | Status: DC

## 2019-05-18 ENCOUNTER — Other Ambulatory Visit: Payer: Self-pay | Admitting: Family Medicine

## 2019-05-18 MED ORDER — SAXAGLIPTIN HCL 5 MG PO TABS: 5.0000 mg | ORAL_TABLET | Freq: Every day | ORAL | 1 refills | Status: DC

## 2019-05-30 ENCOUNTER — Encounter: Payer: Self-pay | Admitting: Family Medicine

## 2019-05-30 ENCOUNTER — Ambulatory Visit (INDEPENDENT_AMBULATORY_CARE_PROVIDER_SITE_OTHER): Payer: 59 | Admitting: Family Medicine

## 2019-05-30 ENCOUNTER — Other Ambulatory Visit: Payer: Self-pay

## 2019-05-30 VITALS — BP 130/84 | HR 87 | Temp 97.8°F | Resp 12 | Ht 75.5 in | Wt 261.4 lb

## 2019-05-30 DIAGNOSIS — Z6832 Body mass index (BMI) 32.0-32.9, adult: Secondary | ICD-10-CM

## 2019-05-30 DIAGNOSIS — E6609 Other obesity due to excess calories: Secondary | ICD-10-CM

## 2019-05-30 DIAGNOSIS — E1169 Type 2 diabetes mellitus with other specified complication: Secondary | ICD-10-CM | POA: Insufficient documentation

## 2019-05-30 DIAGNOSIS — Z23 Encounter for immunization: Secondary | ICD-10-CM

## 2019-05-30 DIAGNOSIS — Z13220 Encounter for screening for lipoid disorders: Secondary | ICD-10-CM

## 2019-05-30 DIAGNOSIS — E1165 Type 2 diabetes mellitus with hyperglycemia: Secondary | ICD-10-CM

## 2019-05-30 LAB — BASIC METABOLIC PANEL
BUN: 14 mg/dL (ref 6–23)
CO2: 30 mEq/L (ref 19–32)
Calcium: 9.9 mg/dL (ref 8.4–10.5)
Chloride: 100 mEq/L (ref 96–112)
Creatinine, Ser: 1.07 mg/dL (ref 0.40–1.50)
GFR: 89.6 mL/min (ref >60.00)
Glucose, Bld: 156 mg/dL — ABNORMAL HIGH (ref 70–99)
Potassium: 4.2 mEq/L (ref 3.5–5.1)
Sodium: 138 mEq/L (ref 135–145)

## 2019-05-30 LAB — LIPID PANEL
Cholesterol: 194 mg/dL (ref 0–200)
HDL: 49.7 mg/dL (ref >39.00)
LDL Cholesterol: 124 mg/dL — ABNORMAL HIGH (ref 0–99)
NonHDL: 144.63
Total CHOL/HDL Ratio: 4
Triglycerides: 103 mg/dL (ref 0.0–149.0)
VLDL: 20.6 mg/dL (ref 0.0–40.0)

## 2019-05-30 LAB — MICROALBUMIN / CREATININE URINE RATIO
Creatinine,U: 32.4 mg/dL
Microalb Creat Ratio: 2.2 mg/g (ref 0.0–30.0)
Microalb, Ur: <0.7 mg/dL (ref 0.0–1.9)

## 2019-05-30 NOTE — Assessment & Plan Note (Addendum)
Problem is stable, he has not lost any wt. We discussed benefits of wt loss as well as adverse effects of obesity. Consistency with healthy diet and physical activity recommended.

## 2019-05-30 NOTE — Assessment & Plan Note (Signed)
Still having elevated BS's. Continue Onglyza 5 mg daily. A1c is due on 11/3 2020, future labs will be arranged. We discussed possible complications of poorly controlled glucose. Overdue for eye exam, recommend arranging appointment with eye care provider. Further recommendation will be given according to lab results.

## 2019-05-30 NOTE — Progress Notes (Signed)
HPI:   [name]. is a 48 y.o. male, who is here today for chronic disease management.  Recently dx with DM II. Metformin caused severe GI symptoms, including diarrhea, so he was discontinued. His health insurance did not cover Jardiance. Currently he is on Onglyza 5 mg daily. He refused Lantus and his insurance did not cover other pharmacologic options.  He feels like metformin was controlling glucose better. Fasting glucose between 100- 179. Postprandial under 200. He denies episodes of hypoglycemia.  Negative for numbness, tingling, or burning feet sensation. Denies abdominal pain, nausea,vomiting, polydipsia,polyuria, or polyphagia.   He is tolerating medication well.  Lab Results  Component Value Date   HGBA1C 12.7 (H) 03/28/2019   He missed appointment with nutritionist for diabetic education. He is trying to eat healthier, salads mainly. She usually has salami and ham to salads and he is being careful with amount of dressing.  He is not exercising regularly.   Review of Systems  Constitutional: Negative for activity change, appetite change, fatigue, fever and unexpected weight change.  HENT: Negative for mouth sores, nosebleeds and sore throat.   Eyes: Negative for redness and visual disturbance.  Respiratory: Negative for apnea, cough, shortness of breath and wheezing.   Cardiovascular: Negative for chest pain, palpitations and leg swelling.  Genitourinary: Negative for decreased urine volume, dysuria and hematuria.  Skin: Negative for rash and wound.  Neurological: Negative for weakness and headaches.  Psychiatric/Behavioral: Negative for confusion. The patient is nervous/anxious.   Rest see pertinent positives and negatives per HPI.   Current Outpatient Medications on File Prior to Visit  Medication Sig Dispense Refill  . Blood Glucose Monitoring Suppl (ACCU-CHEK AVIVA CONNECT) w/Device KIT 1 Device by Does not apply route daily. 1  kit 0  . fexofenadine-pseudoephedrine (ALLEGRA-D 24) 180-240 MG 24 hr tablet Take 1 tablet by mouth daily. 30 tablet 11  . glucose blood (ACCU-CHEK GUIDE) test strip Use to test blood sugar daily. 100 each 12  . Insulin Glargine (LANTUS) 100 UNIT/ML Solostar Pen Inject 15 Units into the skin daily. 9 mL 1  . Lancets (ACCU-CHEK SOFT TOUCH) lancets Use as instructed 100 each 12  . pantoprazole (PROTONIX) 40 MG tablet Take 1 tablet (40 mg total) by mouth daily before breakfast. 30 tablet 3  . saxagliptin HCl (ONGLYZA) 5 MG TABS tablet Take 1 tablet (5 mg total) by mouth daily. 90 tablet 1  . triamcinolone (NASACORT ALLERGY 24HR) 55 MCG/ACT AERO nasal inhaler Place 2 sprays into the nose daily. 1 Inhaler 3  . [DISCONTINUED] montelukast (SINGULAIR) 10 MG tablet Take 1 tablet (10 mg total) by mouth at bedtime. (Patient not taking: Reported on 09/27/2014) 30 tablet 11   No current facility-administered medications on file prior to visit.      Past Medical History:  Diagnosis Date  . Allergy   . Arthritis   . Complication of anesthesia    years ago used to hyperventilate no recent problems   . GERD (gastroesophageal reflux disease)   . History of chicken pox    Allergies  Allergen Reactions  . Oxycodone Shortness Of Breath and Other (See Comments)    Hallucinations  . Vicodin [Hydrocodone-Acetaminophen] Shortness Of Breath and Other (See Comments)    Hallucinations    Social History   Socioeconomic History  . Marital status: Married    Spouse name: Not on file  . Number of children: Not on file  . Years of education: Not on file  .  Highest education level: Not on file  Occupational History  . Not on file  Social Needs  . Financial resource strain: Not on file  . Food insecurity    Worry: Not on file    Inability: Not on file  . Transportation needs    Medical: Not on file    Non-medical: Not on file  Tobacco Use  . Smoking status: Former Smoker    Packs/day: 0.75    Years:  20.00    Pack years: 15.00    Types: Cigarettes, Cigars    Quit date: 08/25/2012    Years since quitting: 6.7  . Smokeless tobacco: Former Systems developer    Types: Snuff    Quit date: 08/26/2015  Substance and Sexual Activity  . Alcohol use: Yes    Alcohol/week: 3.0 standard drinks    Types: 3 Cans of beer per week    Comment: Occasionally  . Drug use: No  . Sexual activity: Yes    Birth control/protection: Condom  Lifestyle  . Physical activity    Days per week: Not on file    Minutes per session: Not on file  . Stress: Not on file  Relationships  . Social Herbalist on phone: Not on file    Gets together: Not on file    Attends religious service: Not on file    Active member of club or organization: Not on file    Attends meetings of clubs or organizations: Not on file    Relationship status: Not on file  Other Topics Concern  . Not on file  Social History Narrative  . Not on file    Vitals:   05/30/19 0746  BP: 130/84  Pulse: 87  Resp: 12  Temp: 97.8 F (36.6 C)  SpO2: 97%   Body mass index is 32.24 kg/m. Wt Readings from Last 3 Encounters:  05/30/19 261 lb 6 oz (118.6 kg)  03/28/19 260 lb (117.9 kg)  11/28/17 279 lb (126.6 kg)    Physical Exam  Nursing note reviewed. Constitutional: He is oriented to person, place, and time. He appears well-developed. No distress.  HENT:  Head: Normocephalic and atraumatic.  Mouth/Throat: Oropharynx is clear and moist and mucous membranes are normal.  Eyes: Pupils are equal, round, and reactive to light. Conjunctivae are normal.  Cardiovascular: Normal rate and regular rhythm.  No murmur heard. Pulses:      Dorsalis pedis pulses are 2+ on the right side and 2+ on the left side.  Respiratory: Effort normal and breath sounds normal. No respiratory distress.  GI: Soft. He exhibits no mass. There is no hepatomegaly. There is no abdominal tenderness.  Musculoskeletal:        General: No edema.  Lymphadenopathy:    He has  no cervical adenopathy.  Neurological: He is alert and oriented to person, place, and time. He has normal strength. No cranial nerve deficit. Gait normal.  Skin: Skin is warm. No rash noted. No erythema.  Psychiatric: He has a normal mood and affect. Cognition and memory are normal.  Well groomed, good eye contact.    ASSESSMENT AND PLAN:  Brendan Macdonald was seen today for follow-up.  Diagnoses and all orders for this visit: Orders Placed This Encounter  Procedures  . Pneumococcal polysaccharide vaccine 23-valent greater than or equal to 2yo subcutaneous/IM  . Flu Vaccine QUAD 36+ mos IM  . Basic metabolic panel  . Fructosamine  . Lipid panel  . Microalbumin / creatinine urine ratio  Lab Results  Component Value Date   CHOL 194 05/30/2019   HDL 49.70 05/30/2019   LDLCALC 124 (H) 05/30/2019   TRIG 103.0 05/30/2019   CHOLHDL 4 05/30/2019   Lab Results  Component Value Date   MICROALBUR <0.7 05/30/2019   Lab Results  Component Value Date   CREATININE 1.07 05/30/2019   BUN 14 05/30/2019   NA 138 05/30/2019   K 4.2 05/30/2019   CL 100 05/30/2019   CO2 30 05/30/2019    Screening for lipid disorders -     Lipid panel -     Microalbumin / creatinine urine ratio  Need for influenza vaccination -     Flu Vaccine QUAD 36+ mos IM  Need for pneumococcal vaccination -     Pneumococcal polysaccharide vaccine 23-valent greater than or equal to 2yo subcutaneous/IM  Type 2 diabetes mellitus with hyperglycemia ([place]) Still having elevated BS's. Continue Onglyza 5 mg daily. A1c is due on 11/3 2020, future labs will be arranged. We discussed possible complications of poorly controlled glucose. Overdue for eye exam, recommend arranging appointment with eye care provider. Further recommendation will be given according to lab results.  Class 1 obesity with body mass index (BMI) of 32.0 to 32.9 in adult Problem is stable, he has not lost any wt. We discussed benefits of wt loss as  well as adverse effects of obesity. Consistency with healthy diet and physical activity recommended.   Return in about 4 months (around 09/30/2019).  -Mr. Darreld Mclean. was advised to return sooner than planned today if new concerns arise.     G. Martinique, MD  [facility]. [place] office.

## 2019-05-30 NOTE — Patient Instructions (Signed)
A few things to remember from today's visit:   Type 2 diabetes mellitus with hyperglycemia, unspecified whether long term insulin use ([place]) - Plan: Basic metabolic panel, Fructosamine, Microalbumin / creatinine urine ratio  Screening for lipid disorders - Plan: Lipid panel, Microalbumin / creatinine urine ratio   Diabetes Mellitus and Nutrition, Adult When you have diabetes (diabetes mellitus), it is very important to have healthy eating habits because your blood sugar (glucose) levels are greatly affected by what you eat and drink. Eating healthy foods in the appropriate amounts, at about the same times every day, can help you:  Control your blood glucose.  Lower your risk of heart disease.  Improve your blood pressure.  Reach or maintain a healthy weight. Every person with diabetes is different, and each person has different needs for a meal plan. Your health care provider may recommend that you work with a diet and nutrition specialist (dietitian) to make a meal plan that is best for you. Your meal plan may vary depending on factors such as:  The calories you need.  The medicines you take.  Your weight.  Your blood glucose, blood pressure, and cholesterol levels.  Your activity level.  Other health conditions you have, such as heart or kidney disease. How do carbohydrates affect me? Carbohydrates, also called carbs, affect your blood glucose level more than any other type of food. Eating carbs naturally raises the amount of glucose in your blood. Carb counting is a method for keeping track of how many carbs you eat. Counting carbs is important to keep your blood glucose at a healthy level, especially if you use insulin or take certain oral diabetes medicines. It is important to know how many carbs you can safely have in each meal. This is different for every person. Your dietitian can help you calculate how many carbs you should have at each meal and for each snack. Foods that  contain carbs include:  Bread, cereal, rice, pasta, and crackers.  Potatoes and corn.  Peas, beans, and lentils.  Milk and yogurt.  Fruit and juice.  Desserts, such as cakes, cookies, ice cream, and candy. How does alcohol affect me? Alcohol can cause a sudden decrease in blood glucose (hypoglycemia), especially if you use insulin or take certain oral diabetes medicines. Hypoglycemia can be a life-threatening condition. Symptoms of hypoglycemia (sleepiness, dizziness, and confusion) are similar to symptoms of having too much alcohol. If your health care provider says that alcohol is safe for you, follow these guidelines:  Limit alcohol intake to no more than 1 drink per day for nonpregnant women and 2 drinks per day for men. One drink equals 12 oz of beer, 5 oz of wine, or 1 oz of hard liquor.  Do not drink on an empty stomach.  Keep yourself hydrated with water, diet soda, or unsweetened iced tea.  Keep in mind that regular soda, juice, and other mixers may contain a lot of sugar and must be counted as carbs. What are tips for following this plan?  Reading food labels  Start by checking the serving size on the "Nutrition Facts" label of packaged foods and drinks. The amount of calories, carbs, fats, and other nutrients listed on the label is based on one serving of the item. Many items contain more than one serving per package.  Check the total grams (g) of carbs in one serving. You can calculate the number of servings of carbs in one serving by dividing the total carbs by 15. For  example, if a food has 30 g of total carbs, it would be equal to 2 servings of carbs.  Check the number of grams (g) of saturated and trans fats in one serving. Choose foods that have low or no amount of these fats.  Check the number of milligrams (mg) of salt (sodium) in one serving. Most people should limit total sodium intake to less than 2,300 mg per day.  Always check the nutrition information of  foods labeled as "low-fat" or "nonfat". These foods may be higher in added sugar or refined carbs and should be avoided.  Talk to your dietitian to identify your daily goals for nutrients listed on the label. Shopping  Avoid buying canned, premade, or processed foods. These foods tend to be high in fat, sodium, and added sugar.  Shop around the outside edge of the grocery store. This includes fresh fruits and vegetables, bulk grains, fresh meats, and fresh dairy. Cooking  Use low-heat cooking methods, such as baking, instead of high-heat cooking methods like deep frying.  Cook using healthy oils, such as olive, canola, or sunflower oil.  Avoid cooking with butter, cream, or high-fat meats. Meal planning  Eat meals and snacks regularly, preferably at the same times every day. Avoid going long periods of time without eating.  Eat foods high in fiber, such as fresh fruits, vegetables, beans, and whole grains. Talk to your dietitian about how many servings of carbs you can eat at each meal.  Eat 4-6 ounces (oz) of lean protein each day, such as lean meat, chicken, fish, eggs, or tofu. One oz of lean protein is equal to: ? 1 oz of meat, chicken, or fish. ? 1 egg. ?  cup of tofu.  Eat some foods each day that contain healthy fats, such as avocado, nuts, seeds, and fish. Lifestyle  Check your blood glucose regularly.  Exercise regularly as told by your health care provider. This may include: ? 150 minutes of moderate-intensity or vigorous-intensity exercise each week. This could be brisk walking, biking, or water aerobics. ? Stretching and doing strength exercises, such as yoga or weightlifting, at least 2 times a week.  Take medicines as told by your health care provider.  Do not use any products that contain nicotine or tobacco, such as cigarettes and e-cigarettes. If you need help quitting, ask your health care provider.  Work with a Veterinary surgeoncounselor or diabetes educator to identify  strategies to manage stress and any emotional and social challenges. Questions to ask a health care provider  Do I need to meet with a diabetes educator?  Do I need to meet with a dietitian?  What number can I call if I have questions?  When are the best times to check my blood glucose? Where to find more information:  American Diabetes Association: diabetes.org  Academy of Nutrition and Dietetics: www.eatright.[employer]org  National Institute of Diabetes and Digestive and Kidney Diseases (NIH): Car[employer].tnwww.niddk.nih.gov Summary  A healthy meal plan will help you control your blood glucose and maintain a healthy lifestyle.  Working with a diet and nutrition specialist (dietitian) can help you make a meal plan that is best for you.  Keep in mind that carbohydrates (carbs) and alcohol have immediate effects on your blood glucose levels. It is important to count carbs and to use alcohol carefully. This information is not intended to replace advice given to you by your health care provider. Make sure you discuss any questions you have with your health care provider. Document Released:  05/08/2005 Document Revised: 07/24/2017 Document Reviewed: 09/15/2016 Elsevier Patient Education  2020 [employer].  Please be sure medication list is accurate. If a new problem present, please set up appointment sooner than planned today.

## 2019-06-02 ENCOUNTER — Encounter: Payer: Self-pay | Admitting: Family Medicine

## 2019-06-02 LAB — FRUCTOSAMINE: Fructosamine: 328 umol/L — ABNORMAL HIGH (ref 205–285)

## 2019-06-14 ENCOUNTER — Telehealth: Payer: Self-pay

## 2019-06-14 ENCOUNTER — Encounter: Payer: Self-pay | Admitting: Family Medicine

## 2019-06-14 NOTE — Telephone Encounter (Signed)
Copied from [place] [id number]. Topic: General - Other >> Jun 14, 2019  3:02 PM Wynetta Emery, [place] C wrote: Reason for CRM: pt called in to speak with CMA, pt says that he sent a my-chart to be advised.

## 2019-06-20 NOTE — Telephone Encounter (Signed)
Please advise if okay to write letter.

## 2019-06-22 NOTE — Telephone Encounter (Signed)
Please read conversation through My chart, 06/02/19. BJ

## 2019-06-23 ENCOUNTER — Telehealth: Payer: Self-pay | Admitting: *Deleted

## 2019-06-23 ENCOUNTER — Telehealth: Payer: Self-pay

## 2019-06-23 NOTE — Telephone Encounter (Signed)
Copied from [place] [phone]. 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 He is requesting a call back when it is completed. He states he needs paperwork filled out by 11/3.

## 2019-06-23 NOTE — Telephone Encounter (Signed)
Copied from [place] [phone]. Topic: General - Inquiry >> Jun 20, 2019  4:34 PM Brendan Macdonald wrote: Reason for CRM: pt called in and stated that has new ins kicks in dec.  The saxagliptin HCl (ONGLYZA) 5 MG TABS tablet [[id number]] is over $200. With no ins. He would like to know if office has any samples that he could have to make it to dec

## 2019-06-24 ENCOUNTER — Telehealth: Payer: Self-pay | Admitting: Family Medicine

## 2019-06-24 NOTE — Telephone Encounter (Signed)
Patient also states he is almost out of Lantus (1 pen left) and wants to know if there are any samples available.  He also wants to know if there are any assistance programs for it. He gets new insurance in December.

## 2019-06-24 NOTE — Telephone Encounter (Signed)
Form was placed on PCP's desk by front office staff

## 2019-06-24 NOTE — Telephone Encounter (Signed)
Spoke with patient informed him we dont have any Lantus samples in office. Will forward to PCP for advice. Patient has enough to last through Monday

## 2019-06-24 NOTE — Telephone Encounter (Signed)
Insulin-Treated Diabes Assessment Form to be filled out;placed in dr's folder in pod.  Patient is requesting form back by November 3rd.  Please call [phone] upon completion.

## 2019-06-24 NOTE — Telephone Encounter (Signed)
Clinic RN checked PCP red folder. No paperwork noted. Dr. Martinique have you seen this paperwork

## 2019-06-27 NOTE — Telephone Encounter (Signed)
I was out of the office, so I just saw FMLA form today.  I need the following information: Last eye exam, I need copy of visit. Is he taking Lantus?, it is no longer on his medication list. I need a list of medication he is taking for diabetes.  I have made some recommendations but it seems like his insurance does not covered most. How often is he checking BS's?  He has not had Hg A1c does as recommended, future lab is in place.  Thanks, Brendan Macdonald

## 2019-06-28 ENCOUNTER — Other Ambulatory Visit: Payer: Self-pay

## 2019-06-28 ENCOUNTER — Other Ambulatory Visit (INDEPENDENT_AMBULATORY_CARE_PROVIDER_SITE_OTHER): Payer: Self-pay

## 2019-06-28 DIAGNOSIS — E1165 Type 2 diabetes mellitus with hyperglycemia: Secondary | ICD-10-CM

## 2019-06-28 NOTE — Telephone Encounter (Signed)
Instead Lantus he can try a cheaper type of insulin, NPH.  We need to re-evaluate HgA1C before given recommendations about dose. Future lab is in place.  Thanks, BJ

## 2019-06-28 NOTE — Telephone Encounter (Signed)
Mychart message sent to patient.

## 2019-06-28 NOTE — Telephone Encounter (Signed)
Working on paperwork. I do not have enough information to complete forms. Please refer to one of others phone calls addressing similar concerns. Thanks, BJ

## 2019-06-28 NOTE — Telephone Encounter (Signed)
Pt is needing his paper work back   checking on the status. Pls Fu

## 2019-06-29 ENCOUNTER — Other Ambulatory Visit: Payer: Self-pay

## 2019-06-29 DIAGNOSIS — E1165 Type 2 diabetes mellitus with hyperglycemia: Secondary | ICD-10-CM

## 2019-06-29 LAB — HEMOGLOBIN A1C: Hgb A1c MFr Bld: 7.7 % — ABNORMAL HIGH (ref 4.6–6.5)

## 2019-06-29 NOTE — Telephone Encounter (Signed)
Discussed results with patient, patient expressed understanding. Nothing further needed. ° °

## 2019-06-29 NOTE — Telephone Encounter (Signed)
Copied from [place] [id number]. 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 Please call asap.

## 2019-10-03 ENCOUNTER — Ambulatory Visit: Payer: 59 | Admitting: Family Medicine

## 2019-10-03 ENCOUNTER — Encounter: Payer: Self-pay | Admitting: Family Medicine

## 2019-10-03 ENCOUNTER — Other Ambulatory Visit: Payer: Self-pay

## 2019-10-03 VITALS — BP 128/80 | HR 100 | Temp 96.0°F | Resp 12 | Ht 75.5 in | Wt 273.0 lb

## 2019-10-03 DIAGNOSIS — E1165 Type 2 diabetes mellitus with hyperglycemia: Secondary | ICD-10-CM | POA: Diagnosis not present

## 2019-10-03 DIAGNOSIS — E785 Hyperlipidemia, unspecified: Secondary | ICD-10-CM | POA: Diagnosis not present

## 2019-10-03 DIAGNOSIS — E1169 Type 2 diabetes mellitus with other specified complication: Secondary | ICD-10-CM

## 2019-10-03 DIAGNOSIS — Z794 Long term (current) use of insulin: Secondary | ICD-10-CM | POA: Diagnosis not present

## 2019-10-03 DIAGNOSIS — E6609 Other obesity due to excess calories: Secondary | ICD-10-CM | POA: Diagnosis not present

## 2019-10-03 DIAGNOSIS — Z6832 Body mass index (BMI) 32.0-32.9, adult: Secondary | ICD-10-CM

## 2019-10-03 LAB — BASIC METABOLIC PANEL
BUN: 23 mg/dL (ref 6–23)
CO2: 27 mEq/L (ref 19–32)
Calcium: 9.3 mg/dL (ref 8.4–10.5)
Chloride: 102 mEq/L (ref 96–112)
Creatinine, Ser: 1.12 mg/dL (ref 0.40–1.50)
GFR: 84.87 mL/min (ref >60.00)
Glucose, Bld: 221 mg/dL — ABNORMAL HIGH (ref 70–99)
Potassium: 4.4 mEq/L (ref 3.5–5.1)
Sodium: 136 mEq/L (ref 135–145)

## 2019-10-03 LAB — LIPID PANEL
Cholesterol: 229 mg/dL — ABNORMAL HIGH (ref 0–200)
HDL: 53.7 mg/dL (ref >39.00)
LDL Cholesterol: 146 mg/dL — ABNORMAL HIGH (ref 0–99)
NonHDL: 175.71
Total CHOL/HDL Ratio: 4
Triglycerides: 151 mg/dL — ABNORMAL HIGH (ref 0.0–149.0)
VLDL: 30.2 mg/dL (ref 0.0–40.0)

## 2019-10-03 LAB — POCT GLYCOSYLATED HEMOGLOBIN (HGB A1C): Hemoglobin A1C: 7.9 % — AB (ref 4.0–5.6)

## 2019-10-03 MED ORDER — FARXIGA 10 MG PO TABS: 10.0000 mg | ORAL_TABLET | Freq: Every day | ORAL | 1 refills | Status: DC

## 2019-10-03 MED ORDER — ATORVASTATIN CALCIUM 20 MG PO TABS: 20.0000 mg | ORAL_TABLET | Freq: Every day | ORAL | 2 refills | Status: DC

## 2019-10-03 NOTE — Patient Instructions (Addendum)
A few things to remember from today's visit:   Type 2 diabetes mellitus with hyperglycemia, with long-term current use of insulin ([facility]) - Plan: dapagliflozin propanediol (FARXIGA) 10 MG TABS tablet, Basic metabolic panel  Class 1 obesity due to excess calories with serious comorbidity and body mass index (BMI) of 32.0 to 32.9 in adult  Hyperlipidemia associated with type 2 diabetes mellitus ([facility]) - Plan: Lipid panel  Farxiga started today. Stop metformin.  Please be sure medication list is accurate. If a new problem present, please set up appointment sooner than planned today.

## 2019-10-03 NOTE — Assessment & Plan Note (Signed)
HgA1C is not at goal. Metformin discontinued because GI side effects. Farxiga 10 mg daily started.  Regular exercise and healthy diet with avoidance of added sugar food intake is an important part of treatment and recommended. Annual eye exam, periodic dental and foot care recommended. F/U in 5-6 months

## 2019-10-03 NOTE — Assessment & Plan Note (Signed)
We discussed benefits of wt loss as well as adverse effects of obesity. Consistency with healthy diet and physical activity recommended. Daily brisk walking for 15-30 min as tolerated.  

## 2019-10-03 NOTE — Assessment & Plan Note (Signed)
We discussed benefits of statin medications. LDL ideally should be < 100. Continue low fat diet and recommendations in regard to statin dose will be given according to lipid panel results.

## 2019-10-03 NOTE — Progress Notes (Signed)
HPI:   [name]. is a 49 y.o. male, who is here today for chronic disease management. He was last seen on 05/30/2019. No new problem since his last OV.  DM II: Dx'ed in 03/2019. He is not longer on Lantus, did not start Onglyza because insurance coverage. He has not had his ophthalmologic exam.  BS's 160's-170'. Occasionally 200's, depending of what he eats. Some times forgets his lunch. No hypoglycemic events.  Metformin 500 mg 1/2 bid until 10 days ago.Even low dose metformin is causing diarrhea.  Denies abdominal pain, nausea,vomiting, polydipsia,polyuria, or polyphagia.  Lab Results  Component Value Date   HGBA1C 7.7 (H) 06/28/2019   Lab Results  Component Value Date   MICROALBUR <0.7 05/30/2019    Lab Results  Component Value Date   CREATININE 1.07 05/30/2019   BUN 14 05/30/2019   NA 138 05/30/2019   K 4.2 05/30/2019   CL 100 05/30/2019   CO2 30 05/30/2019   HLD: He did not want statin med. Following low fat diet. Negative for CP, dyspnea, palpitations, or diaphoresis.  Lab Results  Component Value Date   CHOL 194 05/30/2019   HDL 49.70 05/30/2019   LDLCALC 124 (H) 05/30/2019   TRIG 103.0 05/30/2019   CHOLHDL 4 05/30/2019    He is not exercising regularly. Trying to follow a healthful diet. He has gained some weight.  Review of Systems  Constitutional: Negative for activity change, appetite change, fatigue and fever.  HENT: Negative for mouth sores, nosebleeds and sore throat.   Eyes: Negative for redness and visual disturbance.  Respiratory: Negative for cough and wheezing.   Cardiovascular: Negative for leg swelling.  Genitourinary: Negative for decreased urine volume, dysuria and hematuria.  Neurological: Negative for syncope, weakness, numbness and headaches.  Rest of ROS, see pertinent positives sand negatives in HPI  Current Outpatient Medications on File Prior to Visit  Medication Sig Dispense Refill  . Blood  Glucose Monitoring Suppl (ACCU-CHEK AVIVA CONNECT) w/Device KIT 1 Device by Does not apply route daily. 1 kit 0  . fexofenadine-pseudoephedrine (ALLEGRA-D 24) 180-240 MG 24 hr tablet Take 1 tablet by mouth daily. 30 tablet 11  . glucose blood (ACCU-CHEK GUIDE) test strip Use to test blood sugar daily. 100 each 12  . Lancets (ACCU-CHEK SOFT TOUCH) lancets Use as instructed 100 each 12  . pantoprazole (PROTONIX) 40 MG tablet Take 1 tablet (40 mg total) by mouth daily before breakfast. 30 tablet 3  . triamcinolone (NASACORT ALLERGY 24HR) 55 MCG/ACT AERO nasal inhaler Place 2 sprays into the nose daily. 1 Inhaler 3  . [DISCONTINUED] montelukast (SINGULAIR) 10 MG tablet Take 1 tablet (10 mg total) by mouth at bedtime. (Patient not taking: Reported on 09/27/2014) 30 tablet 11   No current facility-administered medications on file prior to visit.   Past Medical History:  Diagnosis Date  . Allergy   . Arthritis   . Complication of anesthesia    years ago used to hyperventilate no recent problems   . GERD (gastroesophageal reflux disease)   . History of chicken pox    Allergies  Allergen Reactions  . Oxycodone Shortness Of Breath and Other (See Comments)    Hallucinations  . Vicodin [Hydrocodone-Acetaminophen] Shortness Of Breath and Other (See Comments)    Hallucinations    Social History   Socioeconomic History  . Marital status: Married    Spouse name: Not on file  . Number of children: Not on file  .  Years of education: Not on file  . Highest education level: Not on file  Occupational History  . Not on file  Tobacco Use  . Smoking status: Former Smoker    Packs/day: 0.75    Years: 20.00    Pack years: 15.00    Types: Cigarettes, Cigars    Quit date: 08/25/2012    Years since quitting: 7.1  . Smokeless tobacco: Former Systems developer    Types: Snuff    Quit date: 08/26/2015  Substance and Sexual Activity  . Alcohol use: Yes    Alcohol/week: 3.0 standard drinks    Types: 3 Cans of beer per  week    Comment: Occasionally  . Drug use: No  . Sexual activity: Yes    Birth control/protection: Condom  Other Topics Concern  . Not on file  Social History Narrative  . Not on file   Social Determinants of Health   Financial Resource Strain:   . Difficulty of Paying Living Expenses: Not on file  Food Insecurity:   . Worried About Charity fundraiser in the Last Year: Not on file  . Ran Out of Food in the Last Year: Not on file  Transportation Needs:   . Lack of Transportation (Medical): Not on file  . Lack of Transportation (Non-Medical): Not on file  Physical Activity:   . Days of Exercise per Week: Not on file  . Minutes of Exercise per Session: Not on file  Stress:   . Feeling of Stress : Not on file  Social Connections:   . Frequency of Communication with Friends and Family: Not on file  . Frequency of Social Gatherings with Friends and Family: Not on file  . Attends Religious Services: Not on file  . Active Member of Clubs or Organizations: Not on file  . Attends Archivist Meetings: Not on file  . Marital Status: Not on file    Vitals:   10/03/19 0702  BP: 128/80  Pulse: 100  Resp: 12  Temp: (!) 96 F (35.6 C)  SpO2: 97%   Wt Readings from Last 3 Encounters:  10/03/19 273 lb (123.8 kg)  05/30/19 261 lb 6 oz (118.6 kg)  03/28/19 260 lb (117.9 kg)   Body mass index is 33.67 kg/m.  Physical Exam  Nursing note and vitals reviewed. Constitutional: He is oriented to person, place, and time. He appears well-developed. No distress.  HENT:  Head: Normocephalic and atraumatic.  Mouth/Throat: Oropharynx is clear and moist and mucous membranes are normal.  Eyes: Pupils are equal, round, and reactive to light. Conjunctivae are normal.  Cardiovascular: Normal rate and regular rhythm.  No murmur heard. Pulses:      Dorsalis pedis pulses are 2+ on the right side and 2+ on the left side.  Respiratory: Effort normal and breath sounds normal. No  respiratory distress.  GI: Soft. He exhibits no mass. There is no hepatomegaly. There is no abdominal tenderness.  Musculoskeletal:        General: No edema.  Lymphadenopathy:    He has no cervical adenopathy.  Neurological: He is alert and oriented to person, place, and time. He has normal strength. No cranial nerve deficit. Gait normal.  Skin: Skin is warm. No rash noted. No erythema.  Psychiatric: He has a normal mood and affect.  Well groomed, good eye contact.   ASSESSMENT AND PLAN:  Brendan Macdonald. was seen today for chronic disease management.   Orders Placed This Encounter  Procedures  .  Basic metabolic panel  . Lipid panel  . POC HgB A1c   Lab Results  Component Value Date   HGBA1C 7.9 (A) 10/03/2019   Lab Results  Component Value Date   CHOL 229 (H) 10/03/2019   HDL 53.70 10/03/2019   LDLCALC 146 (H) 10/03/2019   TRIG 151.0 (H) 10/03/2019   CHOLHDL 4 10/03/2019   Lab Results  Component Value Date   CREATININE 1.12 10/03/2019   BUN 23 10/03/2019   NA 136 10/03/2019   K 4.4 10/03/2019   CL 102 10/03/2019   CO2 27 10/03/2019    Type 2 diabetes mellitus with hyperglycemia ([place]) HgA1C is not at goal. Metformin discontinued because GI side effects. Farxiga 10 mg daily started.  Regular exercise and healthy diet with avoidance of added sugar food intake is an important part of treatment and recommended. Annual eye exam, periodic dental and foot care recommended. F/U in 5-6 months   Hyperlipidemia associated with type 2 diabetes mellitus ([place]) We discussed benefits of statin medications. LDL ideally should be < 100. Continue low fat diet and recommendations in regard to statin dose will be given according to lipid panel results.  Class 1 obesity with body mass index (BMI) of 32.0 to 32.9 in adult We discussed benefits of wt loss as well as adverse effects of obesity. Consistency with healthy diet and physical activity recommended. Daily  brisk walking for 15-30 min as tolerated.    Return in about 5 months (around 03/01/2020) for DM II.   Josefina Rynders G. Martinique, MD  [facility] [place] office.

## 2019-11-16 IMAGING — US ULTRASOUND SCROTUM DOPPLER COMPLETE
1 series · 14 of 25 positions shown · non-contrast
Comparison: None.

CLINICAL DATA: Right scrotal pain for the past 3 months with
thickening of the right epididymis and testicle with tenderness on
physical examination.

EXAM:
SCROTAL ULTRASOUND
DOPPLER ULTRASOUND OF THE TESTICLES
TECHNIQUE: Complete ultrasound examination of the testicles, epididymis, and
other scrotal structures was performed. Color and spectral Doppler
ultrasound were also utilized to evaluate blood flow to the
testicles.

[Series 1: ultrasound scrotum doppler complete · 0.06mm/px · 14 of 53 slices shown]
[im 1/53]
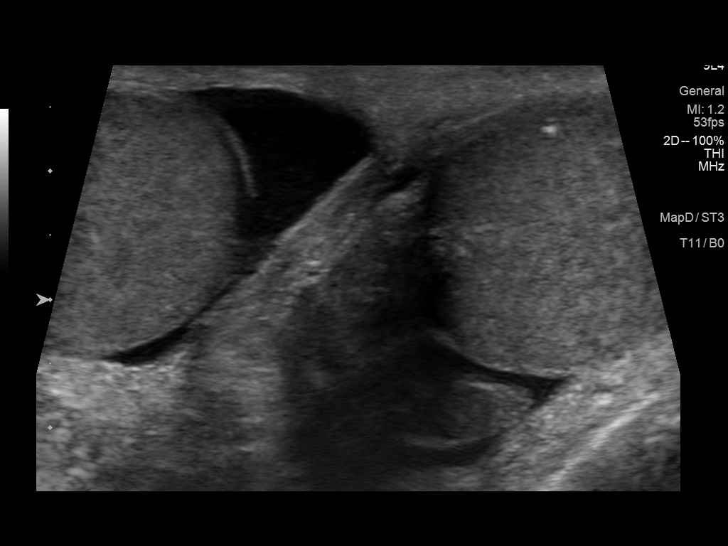
[im 5/53]
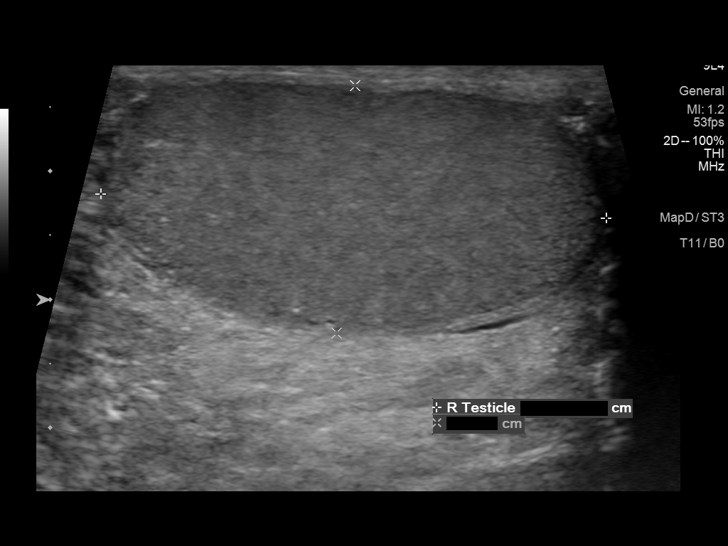
[im 9/53]
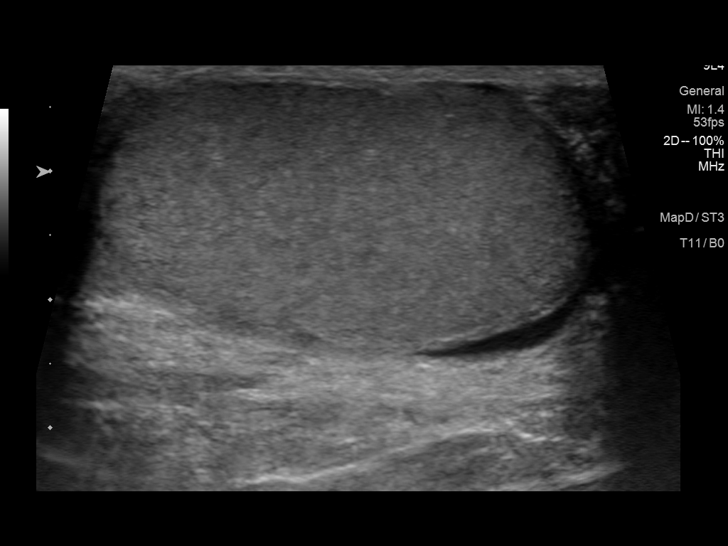
[im 14/53]
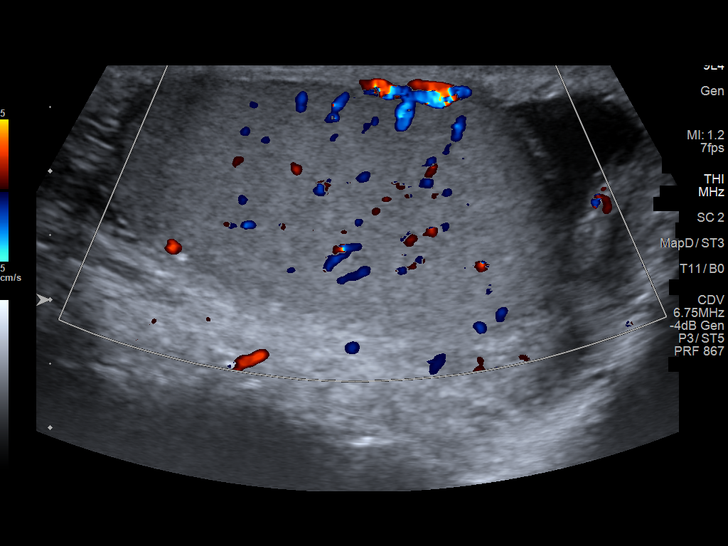
[im 18/53]
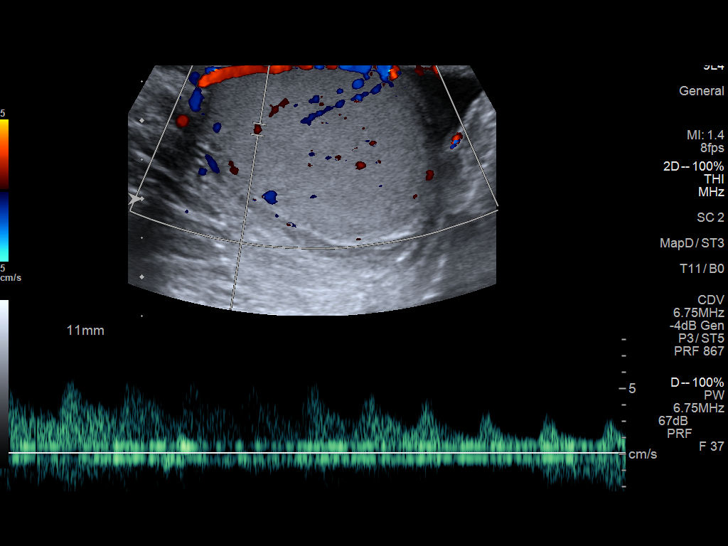
[im 20/53]
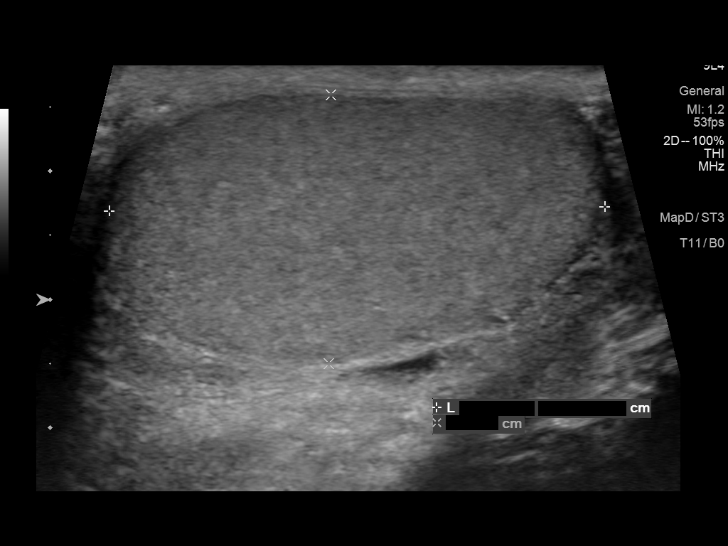
[im 24/53]
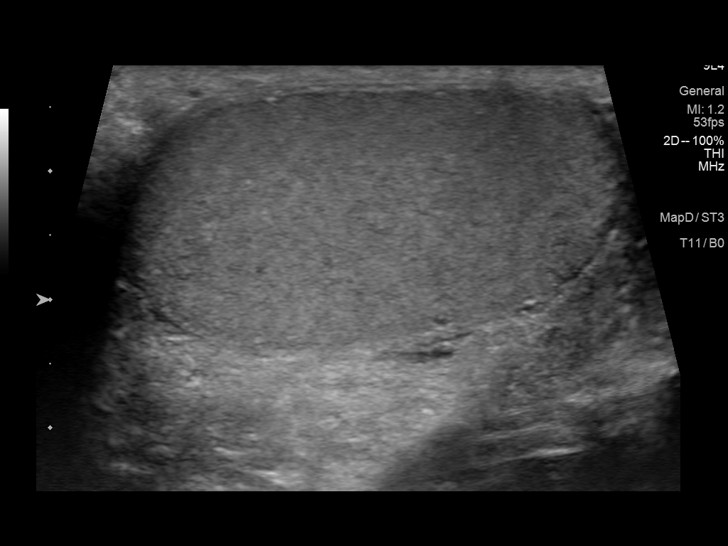
[im 29/53]
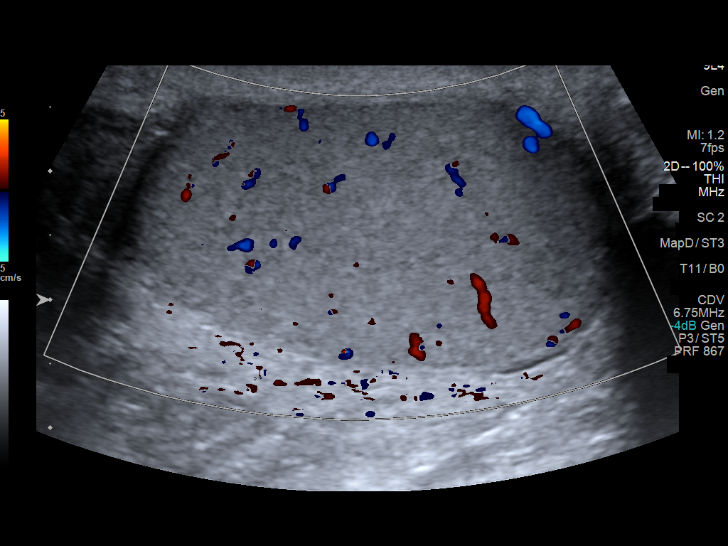
[im 33/53]
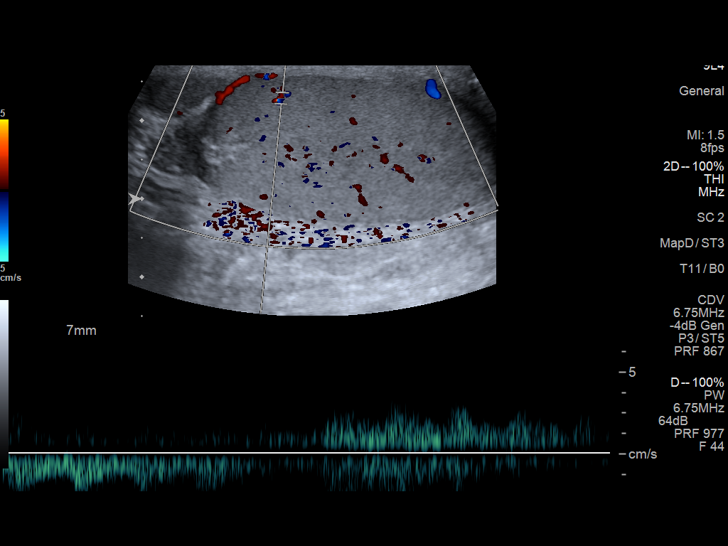
[im 35/53]
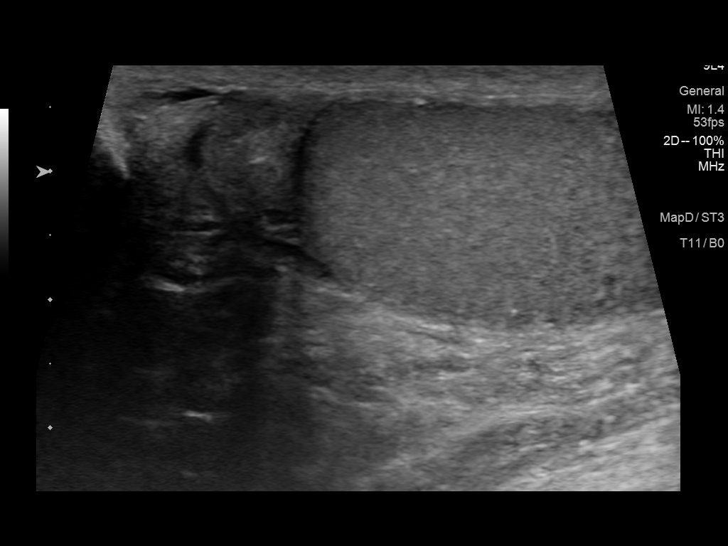
[im 40/53]
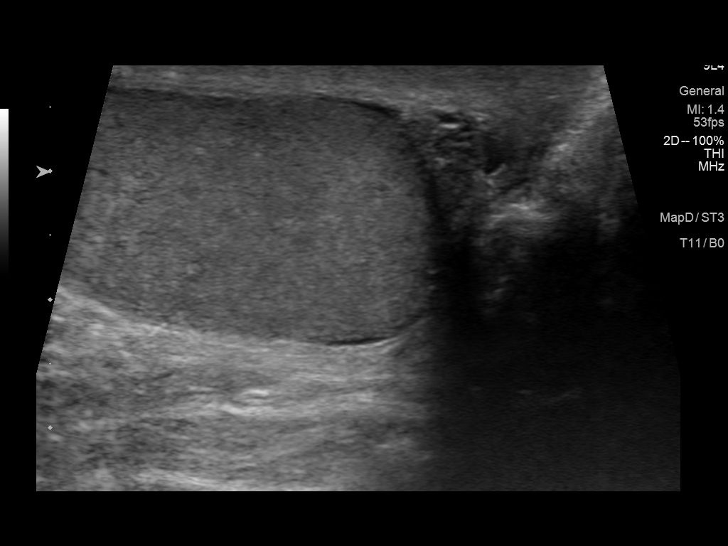
[im 44/53]
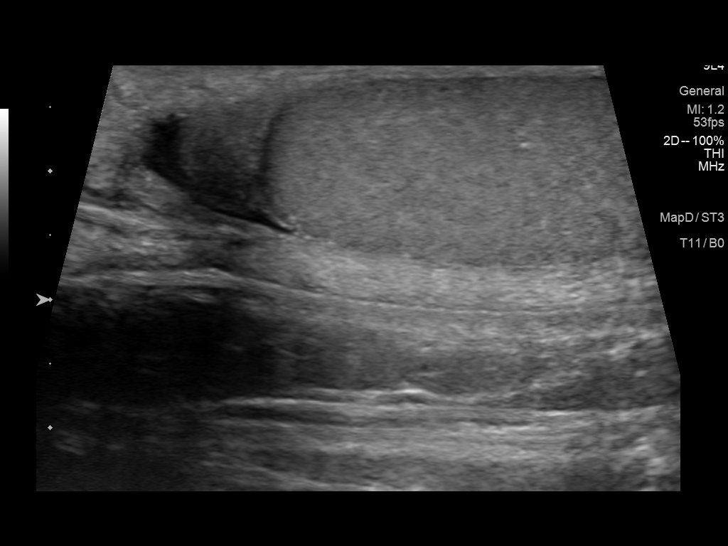
[im 48/53]
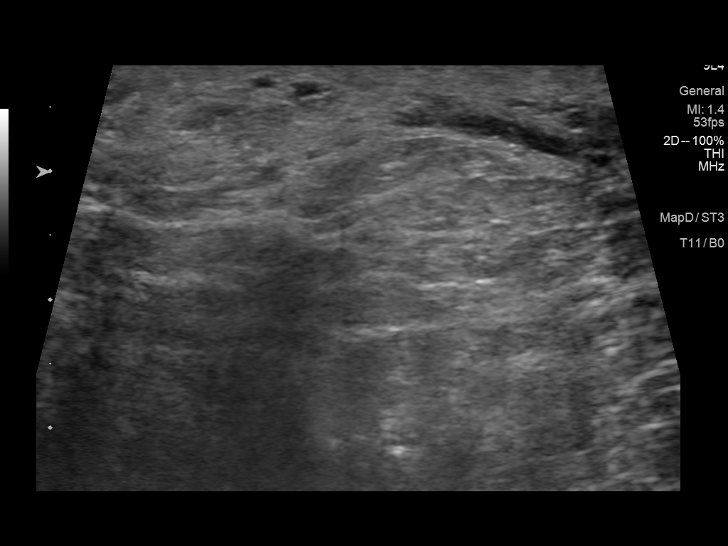
[im 53/53]
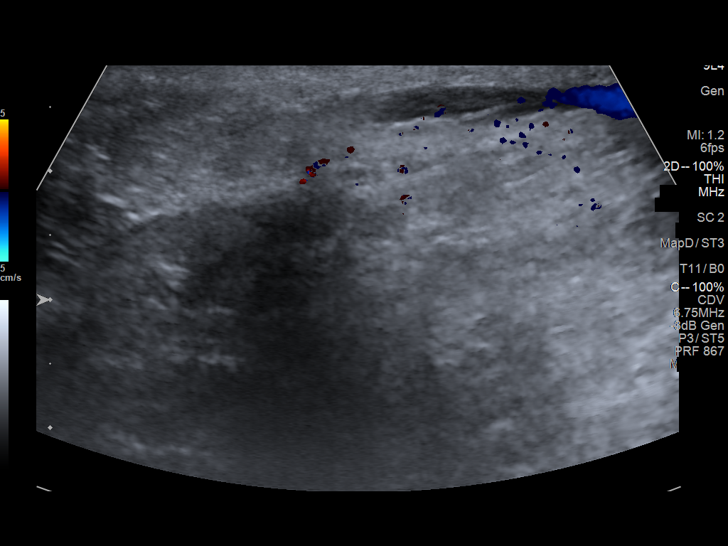

[14 of 25 positions shown; findings below may reference images not displayed]

FINDINGS: Right testicle

Measurements: 3.9 x 2.8 x 1.9 cm. No mass or microlithiasis
visualized.

Left testicle

Measurements: 3.9 x 2.8 x 2.1 cm. No mass or microlithiasis
visualized.

Right epididymis:  Normal in size and appearance.

Left epididymis:  Normal in size and appearance.

Hydrocele:  Small right and minimal left period

Varicocele:  None visualized.

Pulsed Doppler interrogation of both testes demonstrates normal low
resistance arterial and venous waveforms bilaterally.
IMPRESSION: Small right and minimal left hydrocele. Otherwise, normal
examination.

## 2020-07-05 NOTE — Progress Notes (Signed)
HPI: Brendan Macdonald. is a 48 y.o. male, who is here today for follow up.   He was last seen on 10/03/19. No new problems since his last visit.  DM II: Dx'ed on 03/27/20. Started on Farxiga 10 mg daily. He was not taking medication until a month ago. BS's 200's. Lab Results  Component Value Date   HGBA1C 7.9 (A) 10/03/2019  He is trying to eat better. Tries to stay away from candy and starches but not consistently. 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 + Polydipsia and polyuria.  Lab Results  Component Value Date   CREATININE 1.12 10/03/2019   BUN 23 10/03/2019   NA 136 10/03/2019   K 4.4 10/03/2019   CL 102 10/03/2019   CO2 27 10/03/2019   HLD: He is on Atorvastatin 20 mg daily. Tolerating medications well. Eating fried food sometimes, his wife doe snot cook healthy meals in general.  Lab Results  Component Value Date   CHOL 229 (H) 10/03/2019   HDL 53.70 10/03/2019   LDLCALC 146 (H) 10/03/2019   TRIG 151.0 (H) 10/03/2019   CHOLHDL 4 10/03/2019    According to pt, during DOT physical it was recommended sleep study because "wide neck." His wife has not witnessed apnea or loud snoring. 4-5 hours of sleep. He feels rested in the morning most of the time. Sleepy during the day but he thinks it is because working hours. Works from 1 am and back home 6-7 pm.  Today he is c/o sharp LLB pain, intermittent in bed when ready to get up. Problem has not affected functions/performance during the day. Back pain started 4-5 months ago, gradually improving.  No radiated. Negative for saddle anesthesia, numbness/tingling of LE's,or bladder/bowel dysfunction. No hx of trauma or unusual physical activity. Has taken Motrin.  Left wrist pain that started 2 weeks ago. No hx of trauma. He also noted some edema, mid wrist and ulnar area. Exacerbated by movement. Achy/sharp pain, 6/10,intermittently. No limitation of ROM. Right  handed.  He has taken Tylenol.  Urinary frequency and nocturia x 2. Concerned about prostate issues. Denies dysuria, gross hematuria,or decreased urine output. No changes in urine stream or dribbling.  Review of Systems  Constitutional: Negative for activity change, appetite change and fever.  HENT: Negative for nosebleeds and sore throat.   Eyes: Negative for redness and visual disturbance.  Respiratory: Negative for cough, shortness of breath and wheezing.   Cardiovascular: Negative for chest pain, palpitations and leg swelling.  Gastrointestinal: Negative for abdominal pain, nausea and vomiting.  Skin: Negative for pallor and rash.  Neurological: Negative for syncope, weakness and headaches.  Rest of ROS, see pertinent positives sand negatives in HPI  Current Outpatient Medications on File Prior to Visit  Medication Sig Dispense Refill  . atorvastatin (LIPITOR) 20 MG tablet Take 1 tablet (20 mg total) by mouth daily. 90 tablet 2  . Blood Glucose Monitoring Suppl (ACCU-CHEK AVIVA CONNECT) w/Device KIT 1 Device by Does not apply route daily. 1 kit 0  . fexofenadine-pseudoephedrine (ALLEGRA-D 24) 180-240 MG 24 hr tablet Take 1 tablet by mouth daily. 30 tablet 11  . glucose blood (ACCU-CHEK GUIDE) test strip Use to test blood sugar daily. 100 each 12  . Lancets (ACCU-CHEK SOFT TOUCH) lancets Use as instructed 100 each 12  . pantoprazole (PROTONIX) 40 MG tablet Take 1 tablet (40 mg total) by mouth daily before breakfast. 30 tablet 3  . triamcinolone (NASACORT ALLERGY  24HR) 55 MCG/ACT AERO nasal inhaler Place 2 sprays into the nose daily. 1 Inhaler 3  . [DISCONTINUED] montelukast (SINGULAIR) 10 MG tablet Take 1 tablet (10 mg total) by mouth at bedtime. (Patient not taking: Reported on 09/27/2014) 30 tablet 11   No current facility-administered medications on file prior to visit.   Past Medical History:  Diagnosis Date  . Allergy   . Arthritis   . Complication of anesthesia    years  ago used to hyperventilate no recent problems   . GERD (gastroesophageal reflux disease)   . History of chicken pox    Allergies  Allergen Reactions  . Oxycodone Shortness Of Breath and Other (See Comments)    Hallucinations  . Vicodin [Hydrocodone-Acetaminophen] Shortness Of Breath and Other (See Comments)    Hallucinations    Social History   Socioeconomic History  . Marital status: Married    Spouse name: Not on file  . Number of children: Not on file  . Years of education: Not on file  . Highest education level: Not on file  Occupational History  . Not on file  Tobacco Use  . Smoking status: Former Smoker    Packs/day: 0.75    Years: 20.00    Pack years: 15.00    Types: Cigarettes, Cigars    Quit date: 08/25/2012    Years since quitting: 7.8  . Smokeless tobacco: Former Systems developer    Types: Snuff    Quit date: 08/26/2015  Vaping Use  . Vaping Use: Every day  . Start date: 04/04/2018  Substance and Sexual Activity  . Alcohol use: Yes    Alcohol/week: 3.0 standard drinks    Types: 3 Cans of beer per week    Comment: Occasionally  . Drug use: No  . Sexual activity: Yes    Birth control/protection: Condom  Other Topics Concern  . Not on file  Social History Narrative  . Not on file   Social Determinants of Health   Financial Resource Strain:   . Difficulty of Paying Living Expenses: Not on file  Food Insecurity:   . Worried About Charity fundraiser in the Last Year: Not on file  . Ran Out of Food in the Last Year: Not on file  Transportation Needs:   . Lack of Transportation (Medical): Not on file  . Lack of Transportation (Non-Medical): Not on file  Physical Activity:   . Days of Exercise per Week: Not on file  . Minutes of Exercise per Session: Not on file  Stress:   . Feeling of Stress : Not on file  Social Connections:   . Frequency of Communication with Friends and Family: Not on file  . Frequency of Social Gatherings with Friends and Family: Not on file   . Attends Religious Services: Not on file  . Active Member of Clubs or Organizations: Not on file  . Attends Archivist Meetings: Not on file  . Marital Status: Not on file   Vitals:   07/06/20 0823  BP: 108/70  Pulse: 81  Temp: 98 F (36.7 C)  SpO2: 95%   Wt Readings from Last 3 Encounters:  07/06/20 270 lb (122.5 kg)  10/03/19 273 lb (123.8 kg)  05/30/19 261 lb 6 oz (118.6 kg)    Body mass index is 33.3 kg/m.  Physical Exam Vitals and nursing note reviewed.  Constitutional:      General: He is not in acute distress.    Appearance: He is well-developed.  HENT:  Head: Normocephalic and atraumatic.     Mouth/Throat:     Mouth: Mucous membranes are moist.     Pharynx: Oropharynx is clear.  Eyes:     Conjunctiva/sclera: Conjunctivae normal.     Pupils: Pupils are equal, round, and reactive to light.  Cardiovascular:     Rate and Rhythm: Normal rate and regular rhythm.     Pulses:          Dorsalis pedis pulses are 2+ on the right side and 2+ on the left side.     Heart sounds: No murmur heard.   Pulmonary:     Effort: Pulmonary effort is normal. No respiratory distress.     Breath sounds: Normal breath sounds.  Abdominal:     Palpations: Abdomen is soft. There is no hepatomegaly or mass.     Tenderness: There is no abdominal tenderness.  Musculoskeletal:     Left wrist: Tenderness present. No swelling, deformity, effusion, bony tenderness or snuff box tenderness. Normal range of motion. Normal pulse.     Lumbar back: No tenderness or bony tenderness.     Comments: Pain upon palpation of radio-cubital joint, pain elicited with supination and pronation.  Lymphadenopathy:     Cervical: No cervical adenopathy.  Skin:    General: Skin is warm.     Findings: No erythema or rash.  Neurological:     General: No focal deficit present.     Mental Status: He is alert and oriented to person, place, and time.     Cranial Nerves: No cranial nerve deficit.      Gait: Gait normal.  Psychiatric:        Mood and Affect: Mood and affect normal.   ASSESSMENT AND PLAN:  Brendan Macdonald. was seen today for follow-up.  Orders Placed This Encounter  Procedures  . Flu Vaccine QUAD 6+ mos PF IM (Fluarix Quad PF)  . Microalbumin / creatinine urine ratio  . Hepatitis C antibody  . Lipid panel  . COMPLETE METABOLIC PANEL WITH GFR  . Urinalysis, Routine w reflex microscopic  . PSA  . Amb Referral to Nutrition and Diabetic E  . POC HgB A1c  . Nocturnal polysomnography   Lab Results  Component Value Date   HGBA1C 11.2 (A) 07/06/2020   Lab Results  Component Value Date   CHOL 170 07/06/2020   HDL 53 07/06/2020   LDLCALC 97 07/06/2020   TRIG 104 07/06/2020   CHOLHDL 3.2 07/06/2020   Lab Results  Component Value Date   PSA 0.43 07/06/2020   PSA 0.48 03/28/2019   PSA 0.6 10/05/2013    Lab Results  Component Value Date   MICROALBUR 0.3 07/06/2020   MICROALBUR <0.7 05/30/2019     Lab Results  Component Value Date   ALT 19 07/06/2020   AST 19 07/06/2020   ALKPHOS 98 03/28/2019   BILITOT 0.4 07/06/2020    Type 2 diabetes mellitus with hyperglycemia ([place]) Problem is not well controlled. We discussed possible complications of poorly controlled glucose. He would like to try Glyxambi again, he could not continue because insurance issues but he has a new health insurance. Diabetic education will be arranged. Regular exercise and healthy diet with avoidance of added sugar food intake is an important part of treatment and recommended. Annual eye exam, periodic dental and foot care recommended. F/U in 3-4 months   Hyperlipidemia associated with type 2 diabetes mellitus (HCC) Continue Atorvastatin 20 mg daily. Low fat diet also recommended.  Class 1 obesity with body mass index (BMI) of 32.0 to 32.9 in adult Hast loss about 3 Lb sine 09/2019. We discussed benefits of wt loss as well as adverse effects of obesity. Consistency  with healthy diet and physical activity recommended. Brisk walking 5-7 days per week for 15-30 min as tolerated.   Chronic left-sided low back pain without sciatica Problem has been going on for 5-6 months and improving. Tylenol 500 mg 3-4 times per day if needed. I do not think imaging is needed at this time.  Encounter for HCV screening test for low risk patient - Hepatitis C antibody  Left wrist pain ? Tendinitis, OA. No hx of traume, so for now I think we can hold on imaging. Short course of Celebrex recommended. If persistent, ortho appt can be arranged.  - celecoxib (CELEBREX) 100 MG capsule; Take 1 capsule (100 mg total) by mouth 2 (two) times daily for 10 days.  Dispense: 20 capsule; Refill: 0  Urinary frequency Could be caused by hyperglycemia. We discussed current recommendations in regard to prostate cancer screening.  Hypersomnia Sleep study will be arranged. Wt loss will help.  Need for immunization against influenza - Flu Vaccine QUAD 6+ mos PF IM (Fluarix Quad PF)  Spent 40 minutes with pt.  During this time history was obtained and documented, examination was performed, prior labs reviewed, and assessment/plan discussed.  Return in about 3 months (around 10/17/2020) for DM II.   Lecil Tapp G. [place], MD  [facility]. [place] office.   A few things to remember from today's visit:  Hyperlipidemia associated with type 2 diabetes mellitus ([place]) - Plan: Lipid panel, COMPLETE METABOLIC PANEL WITH GFR  Type 2 diabetes mellitus with hyperglycemia, with long-term current use of insulin (HCC) - Plan: POC HgB A1c, Microalbumin / creatinine urine ratio, Empagliflozin-linaGLIPtin (GLYXAMBI) 25-5 MG TABS, Amb Referral to Nutrition and Diabetic E  Class 1 obesity due to excess calories with serious comorbidity and body mass index (BMI) of 32.0 to 32.9 in adult  Encounter for HCV screening test for low risk patient - Plan: Hepatitis C antibody  Left wrist pain  - Plan: celecoxib (CELEBREX) 100 MG capsule  Chronic left-sided low back pain without sciatica  Urinary frequency - Plan: Urinalysis, Routine w reflex microscopic, PSA  Hypersomnia - Plan: Nocturnal polysomnography  Today we are starting a new diabetes medication, which worked for you in the past. Appointment with nutritionist is going to be arranged. Moderation with sweets and carbs.  Walking for 15 min daily, brisk walking, recommended. Eye exam is due.  If left wrist pain is not better, we need to arrange appt with ortho.  Sleep study will be arranged as requested.  If you need refills please call your pharmacy. Do not use My Chart to request refills or for acute issues that need immediate attention.    Please be sure medication list is accurate. If a new problem present, please set up appointment sooner than planned today.

## 2020-07-06 ENCOUNTER — Other Ambulatory Visit: Payer: Self-pay

## 2020-07-06 ENCOUNTER — Encounter: Payer: Self-pay | Admitting: Family Medicine

## 2020-07-06 ENCOUNTER — Ambulatory Visit: Payer: 59 | Admitting: Family Medicine

## 2020-07-06 VITALS — BP 108/70 | HR 81 | Temp 98.0°F | Ht 75.5 in | Wt 270.0 lb

## 2020-07-06 DIAGNOSIS — E6609 Other obesity due to excess calories: Secondary | ICD-10-CM

## 2020-07-06 DIAGNOSIS — M545 Low back pain, unspecified: Secondary | ICD-10-CM

## 2020-07-06 DIAGNOSIS — E1169 Type 2 diabetes mellitus with other specified complication: Secondary | ICD-10-CM

## 2020-07-06 DIAGNOSIS — Z23 Encounter for immunization: Secondary | ICD-10-CM | POA: Diagnosis not present

## 2020-07-06 DIAGNOSIS — Z1159 Encounter for screening for other viral diseases: Secondary | ICD-10-CM

## 2020-07-06 DIAGNOSIS — Z794 Long term (current) use of insulin: Secondary | ICD-10-CM | POA: Diagnosis not present

## 2020-07-06 DIAGNOSIS — G4710 Hypersomnia, unspecified: Secondary | ICD-10-CM

## 2020-07-06 DIAGNOSIS — E1165 Type 2 diabetes mellitus with hyperglycemia: Secondary | ICD-10-CM | POA: Diagnosis not present

## 2020-07-06 DIAGNOSIS — R350 Frequency of micturition: Secondary | ICD-10-CM

## 2020-07-06 DIAGNOSIS — M25532 Pain in left wrist: Secondary | ICD-10-CM

## 2020-07-06 DIAGNOSIS — E785 Hyperlipidemia, unspecified: Secondary | ICD-10-CM

## 2020-07-06 DIAGNOSIS — Z6832 Body mass index (BMI) 32.0-32.9, adult: Secondary | ICD-10-CM

## 2020-07-06 DIAGNOSIS — G8929 Other chronic pain: Secondary | ICD-10-CM

## 2020-07-06 LAB — POCT GLYCOSYLATED HEMOGLOBIN (HGB A1C): Hemoglobin A1C: 11.2 % — AB (ref 4.0–5.6)

## 2020-07-06 MED ORDER — CELECOXIB 100 MG PO CAPS: 100.0000 mg | ORAL_CAPSULE | Freq: Two times a day (BID) | ORAL | 0 refills | Status: AC

## 2020-07-06 MED ORDER — GLYXAMBI 25-5 MG PO TABS: 1.0000 | ORAL_TABLET | Freq: Every day | ORAL | 1 refills | Status: DC

## 2020-07-06 NOTE — Assessment & Plan Note (Signed)
Hast loss about 3 Lb sine 09/2019. We discussed benefits of wt loss as well as adverse effects of obesity. Consistency with healthy diet and physical activity recommended. Brisk walking 5-7 days per week for 15-30 min as tolerated.

## 2020-07-06 NOTE — Assessment & Plan Note (Signed)
Problem has been going on for 5-6 months and improving. Tylenol 500 mg 3-4 times per day if needed. I do not think imaging is needed at this time.

## 2020-07-06 NOTE — Assessment & Plan Note (Signed)
Continue Atorvastatin 20 mg daily. Low fat diet also recommended. 

## 2020-07-06 NOTE — Assessment & Plan Note (Addendum)
Problem is not well controlled. We discussed possible complications of poorly controlled glucose. He would like to try Glyxambi again, he could not continue because insurance issues but he has a new health insurance. Diabetic education will be arranged. Regular exercise and healthy diet with avoidance of added sugar food intake is an important part of treatment and recommended. Annual eye exam, periodic dental and foot care recommended. F/U in 3-4 months

## 2020-07-06 NOTE — Patient Instructions (Addendum)
A few things to remember from today's visit:  Hyperlipidemia associated with type 2 diabetes mellitus (HCC) - Plan: Lipid panel, COMPLETE METABOLIC PANEL WITH GFR  Type 2 diabetes mellitus with hyperglycemia, with long-term current use of insulin (HCC) - Plan: POC HgB A1c, Microalbumin / creatinine urine ratio, Empagliflozin-linaGLIPtin (GLYXAMBI) 25-5 MG TABS, Amb Referral to Nutrition and Diabetic E  Class 1 obesity due to excess calories with serious comorbidity and body mass index (BMI) of 32.0 to 32.9 in adult  Encounter for HCV screening test for low risk patient - Plan: Hepatitis C antibody  Left wrist pain - Plan: celecoxib (CELEBREX) 100 MG capsule  Chronic left-sided low back pain without sciatica  Urinary frequency - Plan: Urinalysis, Routine w reflex microscopic, PSA  Hypersomnia - Plan: Nocturnal polysomnography  Today we are starting a new diabetes medication, which worked for you in the past. Appointment with nutritionist is going to be arranged. Moderation with sweets and carbs.  Walking for 15 min daily, brisk walking, recommended. Eye exam is due.  If left wrist pain is not better, we need to arrange appt with ortho.  Sleep study will be arranged as requested.  If you need refills please call your pharmacy. Do not use My Chart to request refills or for acute issues that need immediate attention.    Please be sure medication list is accurate. If a new problem present, please set up appointment sooner than planned today.

## 2020-07-07 MED ORDER — ATORVASTATIN CALCIUM 20 MG PO TABS: 20.0000 mg | ORAL_TABLET | Freq: Every day | ORAL | 3 refills | Status: DC

## 2020-07-09 LAB — URINALYSIS, ROUTINE W REFLEX MICROSCOPIC
Bilirubin Urine: NEGATIVE
Hgb urine dipstick: NEGATIVE
Ketones, ur: NEGATIVE
Leukocytes,Ua: NEGATIVE
Nitrite: NEGATIVE
Protein, ur: NEGATIVE
Specific Gravity, Urine: 1.036 — ABNORMAL HIGH (ref 1.001–1.03)
pH: <=5 (ref 5.0–8.0)

## 2020-07-09 LAB — MICROALBUMIN / CREATININE URINE RATIO
Creatinine, Urine: 68 mg/dL (ref 20–320)
Microalb Creat Ratio: 4 mcg/mg creat (ref <30)
Microalb, Ur: 0.3 mg/dL

## 2020-07-09 LAB — COMPLETE METABOLIC PANEL WITH GFR
AG Ratio: 1.6 (calc) (ref 1.0–2.5)
ALT: 19 U/L (ref 9–46)
AST: 19 U/L (ref 10–40)
Albumin: 4.3 g/dL (ref 3.6–5.1)
Alkaline phosphatase (APISO): 91 U/L (ref 36–130)
BUN: 17 mg/dL (ref 7–25)
CO2: 26 mmol/L (ref 20–32)
Calcium: 9.5 mg/dL (ref 8.6–10.3)
Chloride: 103 mmol/L (ref 98–110)
Creat: 1.14 mg/dL (ref 0.60–1.35)
GFR, Est African American: 88 mL/min/{1.73_m2} (ref >=60)
GFR, Est Non African American: 76 mL/min/{1.73_m2} (ref >=60)
Globulin: 2.7 g/dL (calc) (ref 1.9–3.7)
Glucose, Bld: 235 mg/dL — ABNORMAL HIGH (ref 65–99)
Potassium: 4.5 mmol/L (ref 3.5–5.3)
Sodium: 140 mmol/L (ref 135–146)
Total Bilirubin: 0.4 mg/dL (ref 0.2–1.2)
Total Protein: 7 g/dL (ref 6.1–8.1)

## 2020-07-09 LAB — LIPID PANEL
Cholesterol: 170 mg/dL (ref <200)
HDL: 53 mg/dL (ref >=40)
LDL Cholesterol (Calc): 97 mg/dL (calc)
Non-HDL Cholesterol (Calc): 117 mg/dL (calc) (ref <130)
Total CHOL/HDL Ratio: 3.2 (calc) (ref <5.0)
Triglycerides: 104 mg/dL (ref <150)

## 2020-07-09 LAB — PSA: PSA: 0.43 ng/mL (ref <=4.0)

## 2020-07-09 LAB — HEPATITIS C ANTIBODY
Hepatitis C Ab: NONREACTIVE
SIGNAL TO CUT-OFF: 0.01 (ref <1.00)

## 2020-08-16 ENCOUNTER — Ambulatory Visit: Payer: 59 | Admitting: Dietician

## 2020-09-21 ENCOUNTER — Ambulatory Visit: Payer: 59 | Admitting: Family Medicine

## 2020-09-21 ENCOUNTER — Encounter: Payer: Self-pay | Admitting: Family Medicine

## 2020-09-21 ENCOUNTER — Other Ambulatory Visit: Payer: Self-pay

## 2020-09-21 VITALS — BP 120/70 | HR 101 | Resp 12 | Ht 75.5 in | Wt 273.0 lb

## 2020-09-21 DIAGNOSIS — Z6832 Body mass index (BMI) 32.0-32.9, adult: Secondary | ICD-10-CM

## 2020-09-21 DIAGNOSIS — E6609 Other obesity due to excess calories: Secondary | ICD-10-CM | POA: Diagnosis not present

## 2020-09-21 DIAGNOSIS — K219 Gastro-esophageal reflux disease without esophagitis: Secondary | ICD-10-CM | POA: Diagnosis not present

## 2020-09-21 DIAGNOSIS — Z794 Long term (current) use of insulin: Secondary | ICD-10-CM

## 2020-09-21 DIAGNOSIS — E1165 Type 2 diabetes mellitus with hyperglycemia: Secondary | ICD-10-CM

## 2020-09-21 DIAGNOSIS — J310 Chronic rhinitis: Secondary | ICD-10-CM | POA: Diagnosis not present

## 2020-09-21 LAB — POCT GLYCOSYLATED HEMOGLOBIN (HGB A1C): HbA1c POC (<> result, manual entry): 10.5 % (ref 4.0–5.6)

## 2020-09-21 MED ORDER — MOMETASONE FUROATE 50 MCG/ACT NA SUSP: 2.0000 | Freq: Every day | NASAL | 2 refills | Status: DC

## 2020-09-21 MED ORDER — OZEMPIC (0.25 OR 0.5 MG/DOSE) 2 MG/1.5ML ~~LOC~~ SOPN: 0.5000 mg | PEN_INJECTOR | SUBCUTANEOUS | 0 refills | Status: DC

## 2020-09-21 MED ORDER — EMPAGLIFLOZIN-METFORMIN HCL 12.5-500 MG PO TABS: 2.0000 | ORAL_TABLET | Freq: Every day | ORAL | 1 refills | Status: DC

## 2020-09-21 MED ORDER — PANTOPRAZOLE SODIUM 40 MG PO TBEC: 40.0000 mg | DELAYED_RELEASE_TABLET | Freq: Every day | ORAL | 3 refills | Status: DC

## 2020-09-21 NOTE — Patient Instructions (Addendum)
A few things to remember from today's visit:   Type 2 diabetes mellitus with hyperglycemia, with long-term current use of insulin (HCC) - Plan: POC HgB A1c, Semaglutide,0.25 or 0.5MG /DOS, (OZEMPIC, 0.25 OR 0.5 MG/DOSE,) 2 MG/1.5ML SOPN  If you need refills please call your pharmacy. Do not use My Chart to request refills or for acute issues that need immediate attention.   Changes today: I would like for you to try combination of metformin with another med, so I sent prescription for Empagliflozin (jardiance) and Metformin to try next time you are off from work.I am giving you samples for Empagliflozin 25 mg to take until you can start combination tablet.  I do not have samples for Ozempic, so I sent prescription. Start with 0.25 mg weekly (as instructed) then increase dose to 0.5 mg weekly for 2 weeks then 1 mg weekly.   Please be sure medication list is accurate. If a new problem present, please set up appointment sooner than planned today.

## 2020-09-21 NOTE — Progress Notes (Signed)
HPI: [name] is a 50 y.o. male, who is here today for follow up.   He was last seen on 07/06/20. He was supposed to be following mid 09/2020. 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 Last A1C on 07/06/2020 was 11.2. Negative for abdominal pain, nausea,vomiting, polydipsia,polyuria, or polyphagia.  Lab Results  Component Value Date   MICROALBUR 0.3 07/06/2020   MICROALBUR <0.7 05/30/2019   Lab Results  Component Value Date   CREATININE 1.14 07/06/2020   BUN 17 07/06/2020   NA 140 07/06/2020   K 4.5 07/06/2020   CL 103 07/06/2020   CO2 26 07/06/2020   He has not seen diabetic educator because appt was cancelled and he has not re-scheduled. He has been busy at work. He has not had had his eye exam..  Right before leaving after AVS was printed he would like to address a couple more concerns.  GERD: Intermittent episodes of heartburn, Prilosec helped initially. Exacerbated by certain foods.  Nasal congestion: He has tried Flonase and Cetirizine 10 mg, do not help. Allegra D helps. Negative for fever,chills,changes in appetite,or sore throat.  He has not identified exacerbating or alleviating factors, it happens all year long.  Review of Systems  Constitutional: Negative for activity change, appetite change, fatigue and fever.  HENT: Negative for mouth sores, nosebleeds and trouble swallowing.   Eyes: Negative for redness and visual disturbance.  Respiratory: Negative for cough, shortness of breath and wheezing.   Cardiovascular: Negative for chest pain, palpitations and leg swelling.  Genitourinary: Negative for decreased urine volume, dysuria and hematuria.  Musculoskeletal: Negative for gait problem and myalgias.  Neurological: Negative for syncope, weakness, numbness and headaches.  Rest of ROS, see pertinent positives sand negatives in HPI  Current Outpatient Medications on File Prior to Visit  Medication Sig Dispense  Refill  . atorvastatin (LIPITOR) 20 MG tablet Take 1 tablet (20 mg total) by mouth daily. 90 tablet 3  . fexofenadine-pseudoephedrine (ALLEGRA-D 24) 180-240 MG 24 hr tablet Take 1 tablet by mouth daily. 30 tablet 11  . [DISCONTINUED] montelukast (SINGULAIR) 10 MG tablet Take 1 tablet (10 mg total) by mouth at bedtime. (Patient not taking: Reported on 09/27/2014) 30 tablet 11   No current facility-administered medications on file prior to visit.     Past Medical History:  Diagnosis Date  . Allergy   . Arthritis   . Complication of anesthesia    years ago used to hyperventilate no recent problems   . GERD (gastroesophageal reflux disease)   . History of chicken pox    Allergies  Allergen Reactions  . Oxycodone Shortness Of Breath and Other (See Comments)    Hallucinations  . Vicodin [Hydrocodone-Acetaminophen] Shortness Of Breath and Other (See Comments)    Hallucinations    Social History   Socioeconomic History  . Marital status: Married    Spouse name: Not on file  . Number of children: Not on file  . Years of education: Not on file  . Highest education level: Not on file  Occupational History  . Not on file  Tobacco Use  . Smoking status: Former Smoker    Packs/day: 0.75    Years: 20.00    Pack years: 15.00    Types: Cigarettes, Cigars    Quit date: 08/25/2012    Years since quitting: 8.0  . Smokeless tobacco: Former Neurosurgeon    Types: Snuff    Quit date: 08/26/2015  Vaping Use  .  Vaping Use: Every day  . Start date: 04/04/2018  Substance and Sexual Activity  . Alcohol use: Yes    Alcohol/week: 3.0 standard drinks    Types: 3 Cans of beer per week    Comment: Occasionally  . Drug use: No  . Sexual activity: Yes    Birth control/protection: Condom  Other Topics Concern  . Not on file  Social History Narrative  . Not on file   Social Determinants of Health   Financial Resource Strain: Not on file  Food Insecurity: Not on file  Transportation Needs: Not on file   Physical Activity: Not on file  Stress: Not on file  Social Connections: Not on file    Vitals:   09/21/20 1522  BP: 120/70  Pulse: (!) 101  Resp: 12  SpO2: 95%   Wt Readings from Last 3 Encounters:  09/21/20 273 lb (123.8 kg)  07/06/20 270 lb (122.5 kg)  10/03/19 273 lb (123.8 kg)   Body mass index is 33.67 kg/m.  Physical Exam Vitals and nursing note reviewed.  Constitutional:      General: He is not in acute distress.    Appearance: He is well-developed.  HENT:     Head: Normocephalic and atraumatic.     Nose: Septal deviation present.     Right Turbinates: Enlarged.     Mouth/Throat:     Mouth: Mucous membranes are moist.     Pharynx: Oropharynx is clear.  Eyes:     Conjunctiva/sclera: Conjunctivae normal.  Cardiovascular:     Rate and Rhythm: Normal rate and regular rhythm.     Pulses:          Dorsalis pedis pulses are 2+ on the right side and 2+ on the left side.     Heart sounds: No murmur heard.   Pulmonary:     Effort: Pulmonary effort is normal. No respiratory distress.     Breath sounds: Normal breath sounds.  Abdominal:     Palpations: Abdomen is soft. There is no hepatomegaly or mass.     Tenderness: There is no abdominal tenderness.  Lymphadenopathy:     Cervical: No cervical adenopathy.  Skin:    General: Skin is warm.     Findings: No erythema or rash.  Neurological:     Mental Status: He is alert and oriented to person, place, and time.     Cranial Nerves: No cranial nerve deficit.     Gait: Gait normal.  Psychiatric:     Comments: Well groomed, good eye contact.    Diabetic Foot Exam - Simple   Simple Foot Form Diabetic Foot exam was performed with the following findings: Yes 09/21/2020 12:11 AM  Visual Inspection See comments: Yes Sensation Testing Intact to touch and monofilament testing bilaterally: Yes Pulse Check Posterior Tibialis and Dorsalis pulse intact bilaterally: Yes Comments Hypertrophic toes nail.    ASSESSMENT  AND PLAN:  Mr. Brendan Macdonald. was seen today for 2-3 months follow-up.  Orders Placed This Encounter  Procedures  . POC HgB A1c   Lab Results  Component Value Date   HGBA1C 10.5 09/21/2020   Type 2 diabetes mellitus with hyperglycemia, with long-term current use of insulin (HCC) Problem is not well controlled, HgA1C far from goal. He would like to try Ozempic, some side effects discussed. He has not tolerated metformin in the past, he agrees with trying it in combination with Empagliflozin. He is afraid of having GI symptoms while working,so will try next  weekend, when he is off.Samples of Jardiance 25 mg given to take in the mind time. We discussed possible complications of elevated glucose. Regular exercise and healthy diet with avoidance of added sugar food intake is an important part of treatment and recommended. Annual eye exam, periodic dental and foot care recommended. F/U in 3-4 months  -     Empagliflozin-metFORMIN HCl 12.5-500 MG TABS; Take 2 tablets by mouth daily. -     Semaglutide,0.25 or 0.5MG /DOS, (OZEMPIC, 0.25 OR 0.5 MG/DOSE,) 2 MG/1.5ML SOPN; Inject 0.5 mg into the skin once a week.  Gastroesophageal reflux disease, unspecified whether esophagitis present Not well controlled. GERD precautions. Protonix 40 mg daily.  -     pantoprazole (PROTONIX) 40 MG tablet; Take 1 tablet (40 mg total) by mouth daily.  Chronic rhinitis He has tried a few intra nasal steroids, unsuccessfully. Nasonex nasal spray to use daily. Nasal saline irrigations as needed. Continue Allegra D as needed. Side effects of medications discussed.  -     mometasone (NASONEX) 50 MCG/ACT nasal spray; Place 2 sprays into the nose daily.  Class 1 obesity due to excess calories with serious comorbidity and body mass index (BMI) of 32.0 to 32.9 in adult We discussed benefits of wt loss as well as adverse effects of obesity. Consistency with healthy diet and physical activity  recommended.   Spent 48 minutes with pt.  During this time history was obtained and documented, examination was performed, prior labs reviewed, and assessment/plan discussed.  Return in about 3 months (around 12/20/2020) for DM II.   Anuhea Gassner G. [place], MD  [facility]. [place] office.   A few things to remember from today's visit:   Type 2 diabetes mellitus with hyperglycemia, with long-term current use of insulin (HCC) - Plan: POC HgB A1c, Semaglutide,0.25 or 0.5MG /DOS, (OZEMPIC, 0.25 OR 0.5 MG/DOSE,) 2 MG/1.5ML SOPN  If you need refills please call your pharmacy. Do not use My Chart to request refills or for acute issues that need immediate attention.   Changes today: I would like for you to try combination of metformin with another med, so I sent prescription for Empagliflozin (jardiance) and Metformin to try next time you are off from work.I am giving you samples for Empagliflozin 25 mg to take until you can start combination tablet.  I do not have samples for Ozempic, so I sent prescription. Start with 0.25 mg weekly (as instructed) then increase dose to 0.5 mg weekly for 2 weeks then 1 mg weekly.   Please be sure medication list is accurate. If a new problem present, please set up appointment sooner than planned today.

## 2020-09-22 ENCOUNTER — Encounter: Payer: Self-pay | Admitting: Family Medicine

## 2020-10-01 ENCOUNTER — Ambulatory Visit: Payer: 59 | Admitting: Family Medicine

## 2020-10-01 ENCOUNTER — Encounter: Payer: Self-pay | Admitting: Family Medicine

## 2020-10-01 ENCOUNTER — Other Ambulatory Visit: Payer: Self-pay

## 2020-10-01 VITALS — BP 138/90 | HR 88 | Temp 98.3°F | Resp 16 | Ht 75.5 in | Wt 268.4 lb

## 2020-10-01 DIAGNOSIS — H6012 Cellulitis of left external ear: Secondary | ICD-10-CM

## 2020-10-01 DIAGNOSIS — H9202 Otalgia, left ear: Secondary | ICD-10-CM | POA: Diagnosis not present

## 2020-10-01 DIAGNOSIS — R42 Dizziness and giddiness: Secondary | ICD-10-CM | POA: Diagnosis not present

## 2020-10-01 MED ORDER — CEPHALEXIN 500 MG PO CAPS: 500.0000 mg | ORAL_CAPSULE | Freq: Three times a day (TID) | ORAL | 0 refills | Status: AC

## 2020-10-01 MED ORDER — TRIAMCINOLONE ACETONIDE 0.1 % EX CREA: 1.0000 "application " | TOPICAL_CREAM | Freq: Two times a day (BID) | CUTANEOUS | 0 refills | Status: AC

## 2020-10-01 NOTE — Patient Instructions (Addendum)
A few things to remember from today's visit:  Earache, left  Lightheadedness  Cellulitis of left external ear - Plan: triamcinolone (KENALOG) 0.1 %, cephALEXin (KEFLEX) 500 MG capsule  If you need refills please call your pharmacy. Do not use My Chart to request refills or for acute issues that need immediate attention.   Monitor for new symptoms. Try to pop ear a few times during the day. Keep area clean and avoid scratching.  Please be sure medication list is accurate. If a new problem present, please set up appointment sooner than planned today.

## 2020-10-01 NOTE — Progress Notes (Signed)
Chief Complaint  Patient presents with   Ear Pain    Left, itchy, feels like balance is off, a little nausea.    HPI: Brendan Macdonald. is a 50 y.o. male, who is here today with above complaint.  3 days of left ear sharp pain. External ear erythema and earache pain. No hx of trauma or insect bite.  Pain is exacerbated by swallowing.Marland Kitchen "Little knot",tender, in left-side of throat. Negative for fever, chills, stridor, odynophagia, cough, wheezing, or dyspnea. No sick contact. Negative for anosmia and ageusia. She has not taking OTC medication.  He is also complaining about dizziness, he cannot explain sensation but it is not speeding.  "Muffle" sensation left ear. Negative for ear drainage.  "Little" retro-ocular pressure headache, "sinus headache." Last visit mediation for allergies was prescribed but  No associated visual changes, photophobia, vomiting, or focal neurologic deficit. He has not taking OTC medication.  Last seen on 09/21/2020, when Ozempic and empagliflozin-Metformin were prescribed for DM2. So fas he thinks he has tolerated medications well.  Review of Systems  Constitutional: Positive for fatigue. Negative for chills and fever.  HENT: Positive for congestion, postnasal drip and rhinorrhea. Negative for sinus pain and trouble swallowing.   Cardiovascular: Negative for chest pain and palpitations.  Gastrointestinal: Negative for abdominal pain.       Negative for changes in bowel habits.  Allergic/Immunologic: Positive for environmental allergies.  Neurological: Negative for syncope, facial asymmetry and weakness.  Rest see pertinent positives and negatives per HPI.  Current Outpatient Medications on File Prior to Visit  Medication Sig Dispense Refill   atorvastatin (LIPITOR) 20 MG tablet Take 1 tablet (20 mg total) by mouth daily. 90 tablet 3   Empagliflozin-metFORMIN HCl 12.5-500 MG TABS Take 2 tablets by mouth daily. 60 tablet 1    fexofenadine-pseudoephedrine (ALLEGRA-D 24) 180-240 MG 24 hr tablet Take 1 tablet by mouth daily. 30 tablet 11   mometasone (NASONEX) 50 MCG/ACT nasal spray Place 2 sprays into the nose daily. 1 each 2   pantoprazole (PROTONIX) 40 MG tablet Take 1 tablet (40 mg total) by mouth daily. 30 tablet 3   Semaglutide,0.25 or 0.5MG /DOS, (OZEMPIC, 0.25 OR 0.5 MG/DOSE,) 2 MG/1.5ML SOPN Inject 0.5 mg into the skin once a week. 1.5 mL 0   [DISCONTINUED] montelukast (SINGULAIR) 10 MG tablet Take 1 tablet (10 mg total) by mouth at bedtime. (Patient not taking: Reported on 09/27/2014) 30 tablet 11   No current facility-administered medications on file prior to visit.   Past Medical History:  Diagnosis Date   Allergy    Arthritis    Complication of anesthesia    years ago used to hyperventilate no recent problems    GERD (gastroesophageal reflux disease)    History of chicken pox    Allergies  Allergen Reactions   Oxycodone Shortness Of Breath and Other (See Comments)    Hallucinations   Vicodin [Hydrocodone-Acetaminophen] Shortness Of Breath and Other (See Comments)    Hallucinations    Social History   Socioeconomic History   Marital status: Married    Spouse name: Not on file   Number of children: Not on file   Years of education: Not on file   Highest education level: Not on file  Occupational History   Not on file  Tobacco Use   Smoking status: Former Smoker    Packs/day: 0.75    Years: 20.00    Pack years: 15.00    Types: Cigarettes, Cigars  Quit date: 08/25/2012    Years since quitting: 8.1   Smokeless tobacco: Former Neurosurgeon    Types: Snuff    Quit date: 08/26/2015  Vaping Use   Vaping Use: Every day   Start date: 04/04/2018  Substance and Sexual Activity   Alcohol use: Yes    Alcohol/week: 3.0 standard drinks    Types: 3 Cans of beer per week    Comment: Occasionally   Drug use: No   Sexual activity: Yes    Birth control/protection: Condom  Other Topics  Concern   Not on file  Social History Narrative   Not on file   Social Determinants of Health   Financial Resource Strain: Not on file  Food Insecurity: Not on file  Transportation Needs: Not on file  Physical Activity: Not on file  Stress: Not on file  Social Connections: Not on file    Vitals:   10/01/20 1052  BP: 138/90  Pulse: 88  Resp: 16  Temp: 98.3 F (36.8 C)  SpO2: 96%   Body mass index is 33.1 kg/m.  Physical Exam Vitals and nursing note reviewed.  Constitutional:      General: He is not in acute distress.    Appearance: He is well-developed. He is not ill-appearing.  HENT:     Head: Normocephalic and atraumatic.     Right Ear: Tympanic membrane, ear canal and external ear normal. No tenderness. No middle ear effusion.     Left Ear: Tympanic membrane, ear canal and external ear normal. Swelling present. No tenderness.  No middle ear effusion.     Ears:      Comments: Erythema,local heat left auricle (helix). There is no induration,fluctuant area,or pain with palpation.    Nose: Rhinorrhea present. No mucosal edema.     Right Turbinates: Enlarged.     Left Turbinates: Not enlarged.     Right Sinus: No maxillary sinus tenderness or frontal sinus tenderness.     Left Sinus: No maxillary sinus tenderness or frontal sinus tenderness.     Mouth/Throat:     Mouth: Oropharynx is clear and moist and mucous membranes are normal. Mucous membranes are moist.     Pharynx: Oropharynx is clear.  Eyes:     Extraocular Movements: EOM normal.     Conjunctiva/sclera: Conjunctivae normal.  Cardiovascular:     Rate and Rhythm: Normal rate and regular rhythm.     Heart sounds: No murmur heard.   Pulmonary:     Effort: Pulmonary effort is normal. No respiratory distress.     Breath sounds: Normal breath sounds. No stridor.  Lymphadenopathy:     Head:     Right side of head: No submandibular, preauricular or posterior auricular adenopathy.     Left side of head: No  submandibular, preauricular or posterior auricular adenopathy.     Cervical: No cervical adenopathy.  Skin:    General: Skin is warm.     Findings: No erythema or rash.  Neurological:     General: No focal deficit present.     Mental Status: He is alert and oriented to person, place, and time.     Coordination: Romberg sign negative.     Gait: Gait normal.     Deep Tendon Reflexes: Strength normal.  Psychiatric:        Mood and Affect: Mood is anxious.     Comments: Well groomed, good eye contact.   ASSESSMENT AND PLAN:  Mr.Normand was seen today for ear pain.  Diagnoses  and all orders for this visit:  Earache, left Examination of middle ear and ear canal negative.  ? Eustachian tube dysfunction. Hearing test in normal range. Recommend auto inflation maneuvers. Monitor for new symptoms.   Hearing Screening   125Hz  250Hz  500Hz  1000Hz  2000Hz  3000Hz  4000Hz  6000Hz  8000Hz   Right ear:   Pass Pass Pass  Pass    Left ear:   Pass Pass Pass  Pass      Cellulitis of left external ear It doe snot seem to be infectious, ? Insect bite. Because risk factors, recommend oral abx. Topical steroid , small amount, bid x 14 days also recommended. Clearly instructed about warning signs.  -     triamcinolone (KENALOG) 0.1 %; Apply 1 application topically 2 (two) times daily for 14 days. -     cephALEXin (KEFLEX) 500 MG capsule; Take 1 capsule (500 mg total) by mouth 3 (three) times daily for 7 days.  Lightheadedness We discussed possible causes. Hx is not the typical for vertigo. Could be aggravated by allergies. OTC decongestants may help, side effects discussed. Normal neurologic examination today. Fall precautions.  Monitor for new symptoms. Instructed about warning signs.  Return if symptoms worsen or fail to improve.   Rayshad Riviello G. , MD  [facility]. [place] office.   A few things to remember from today's visit:  Earache, left  Lightheadedness  Cellulitis  of left external ear - Plan: triamcinolone (KENALOG) 0.1 %, cephALEXin (KEFLEX) 500 MG capsule  If you need refills please call your pharmacy. Do not use My Chart to request refills or for acute issues that need immediate attention.   Monitor for new symptoms. Try to pop ear a few times during the day. Keep area clean and avoid scratching.  Please be sure medication list is accurate. If a new problem present, please set up appointment sooner than planned today.

## 2020-10-02 ENCOUNTER — Encounter: Payer: Self-pay | Admitting: Family Medicine

## 2020-10-10 ENCOUNTER — Encounter: Payer: Self-pay | Admitting: Family Medicine

## 2020-10-12 ENCOUNTER — Other Ambulatory Visit: Payer: Self-pay | Admitting: Family Medicine

## 2020-10-12 DIAGNOSIS — H9202 Otalgia, left ear: Secondary | ICD-10-CM

## 2020-10-15 ENCOUNTER — Ambulatory Visit: Payer: 59 | Admitting: Family Medicine

## 2020-10-30 ENCOUNTER — Other Ambulatory Visit: Payer: Self-pay

## 2020-10-30 ENCOUNTER — Encounter (INDEPENDENT_AMBULATORY_CARE_PROVIDER_SITE_OTHER): Payer: Self-pay | Admitting: Otolaryngology

## 2020-10-30 ENCOUNTER — Ambulatory Visit (INDEPENDENT_AMBULATORY_CARE_PROVIDER_SITE_OTHER): Payer: 59 | Admitting: Otolaryngology

## 2020-10-30 VITALS — Temp 97.7°F

## 2020-10-30 DIAGNOSIS — J342 Deviated nasal septum: Secondary | ICD-10-CM

## 2020-10-30 DIAGNOSIS — J343 Hypertrophy of nasal turbinates: Secondary | ICD-10-CM | POA: Diagnosis not present

## 2020-10-30 DIAGNOSIS — J310 Chronic rhinitis: Secondary | ICD-10-CM | POA: Diagnosis not present

## 2020-10-30 DIAGNOSIS — T485X5A Adverse effect of other anti-common-cold drugs, initial encounter: Secondary | ICD-10-CM

## 2020-10-30 NOTE — Progress Notes (Signed)
HPI: Brendan Macdonald. is a 50 y.o. male who presents is referred by his PCP for evaluation of chronic pain and pressure in his ears as well as chronic nasal congestion and postnasal drainage.  He also describes "sinus pressure" behind his eyes.  He does not feel like he is hearing well.  He has used Flonase and Nasacort in the past but does not seem like this helps much with his chronic nasal congestion.  He has also been using a decongestant spray once or twice a day on a regular basis.  He is also taking Allegra-D which seems to help a little bit.  He has also been on Singulair..  Past Medical History:  Diagnosis Date  . Allergy   . Arthritis   . Complication of anesthesia    years ago used to hyperventilate no recent problems   . GERD (gastroesophageal reflux disease)   . History of chicken pox    Past Surgical History:  Procedure Laterality Date  . Arthroscopy  Bilateral    Knees  . BACK SURGERY    . CHOLECYSTECTOMY N/A 02/24/2013   Procedure: LAPAROSCOPIC CHOLECYSTECTOMY;  Surgeon: Dalia Heading, MD;  Location: [facility];  Service: General;  Laterality: N/A;  . EXAM UNDER ANESTHESIA WITH MANIPULATION OF KNEE Right 03/23/2015   Procedure: RIGHT EXAM UNDER ANESTHESIA WITH MANIPULATION UNDER ANESTHESIA;  Surgeon: Jene Every, MD;  Location: [facility];  Service: Orthopedics;  Laterality: Right;  . KNEE ARTHROSCOPY WITH PATELLAR TENDON REPAIR Right 03/23/2015   Procedure: RIGHT KNEE ARTHROSCOPY WITH DEBRIDEMENT;  Surgeon: Jene Every, MD;  Location: [facility];  Service: Orthopedics;  Laterality: Right;  . LYSIS OF ADHESION Right 03/23/2015   Procedure: LYSIS OF SCAR TISSUE;  Surgeon: Jene Every, MD;  Location: [facility];  Service: Orthopedics;  Laterality: Right;  . ORTHOPEDIC SURGERY     left knee quad tendon tear and patella tear   . QUADRICEPS TENDON REPAIR Right 10/18/2014   Procedure: RIGHT QUADRICEP TENDON REPAIR ;  Surgeon: Javier Docker, MD;  Location: [facility];  Service:  Orthopedics;  Laterality: Right;  . ROTATOR CUFF REPAIR Right    Social History   Socioeconomic History  . Marital status: Married    Spouse name: Not on file  . Number of children: Not on file  . Years of education: Not on file  . Highest education level: Not on file  Occupational History  . Not on file  Tobacco Use  . Smoking status: Former Smoker    Packs/day: 0.75    Years: 20.00    Pack years: 15.00    Types: Cigarettes, Cigars    Quit date: 08/25/2012    Years since quitting: 8.1  . Smokeless tobacco: Former Neurosurgeon    Types: Snuff    Quit date: 08/26/2015  Vaping Use  . Vaping Use: Every day  . Start date: 04/04/2018  Substance and Sexual Activity  . Alcohol use: Yes    Alcohol/week: 3.0 standard drinks    Types: 3 Cans of beer per week    Comment: Occasionally  . Drug use: No  . Sexual activity: Yes    Birth control/protection: Condom  Other Topics Concern  . Not on file  Social History Narrative  . Not on file   Social Determinants of Health   Financial Resource Strain: Not on file  Food Insecurity: Not on file  Transportation Needs: Not on file  Physical Activity: Not on file  Stress: Not on file  Social  Connections: Not on file   Family History  Problem Relation Age of Onset  . Cancer Mother        ovarian and abdomen  . Diabetes Mother   . Hypertension Mother   . Arthritis Mother   . Heart disease Father   . Hypertension Father   . Cancer Father        kidney and bladder   . Arthritis Father   . Hearing loss Father   . Kidney disease Father   . Arthritis Sister   . Stroke Maternal Grandmother   . Stroke Paternal Grandmother   . Arthritis Sister   . Lupus Sister    Allergies  Allergen Reactions  . Oxycodone Shortness Of Breath and Other (See Comments)    Hallucinations  . Vicodin [Hydrocodone-Acetaminophen] Shortness Of Breath and Other (See Comments)    Hallucinations   Prior to Admission medications   Medication Sig Start Date End Date  Taking? Authorizing Provider  atorvastatin (LIPITOR) 20 MG tablet Take 1 tablet (20 mg total) by mouth daily. 07/07/20   [place], [name], MD  Empagliflozin-metFORMIN HCl 12.5-500 MG TABS Take 2 tablets by mouth daily. 09/21/20   [place], [name], MD  fexofenadine-pseudoephedrine (ALLEGRA-D 24) 180-240 MG 24 hr tablet Take 1 tablet by mouth daily. 11/28/17   Remus Loffler, PA-C  mometasone (NASONEX) 50 MCG/ACT nasal spray Place 2 sprays into the nose daily. 09/21/20   [place], [name], MD  pantoprazole (PROTONIX) 40 MG tablet Take 1 tablet (40 mg total) by mouth daily. 09/21/20   [place], [name], MD  Semaglutide,0.25 or 0.5MG /DOS, (OZEMPIC, 0.25 OR 0.5 MG/DOSE,) 2 MG/1.5ML SOPN Inject 0.5 mg into the skin once a week. 09/21/20   [place], [name], MD  montelukast (SINGULAIR) 10 MG tablet Take 1 tablet (10 mg total) by mouth at bedtime. Patient not taking: Reported on 09/27/2014 09/21/13 09/27/14  Deatra Canter, FNP     Positive ROS: Otherwise negative  All other systems have been reviewed and were otherwise negative with the exception of those mentioned in the HPI and as above.  Physical Exam: Constitutional: Alert, well-appearing, no acute distress Ears: External ears without lesions or tenderness. Ear canals are clear bilaterally.  On microscopic exam both TMs are clear with good mobility on pneumatic otoscopy.  No middle ear effusion noted on either side.  On tuning fork testing Weber was midline with AC > BC bilaterally with the 512 fork.  Subjectively he had reasonably good hearing in both ears with a 1024 tuning fork but perhaps a mild hearing loss on the right side. Nasal: External nose without lesions. Septum with moderate deviation.  After decongesting the nose with Afrin nasal endoscopy was performed and on nasal endoscopy there were no polyps noted.  Middle meatus regions were clear with no mucopurulent discharge noted..  Nasopharynx was clear.  Mucus within the nasal cavity was clear with no  clinical evidence of active infection. Oral: Lips and gums without lesions. Tongue and palate mucosa without lesions. Posterior oropharynx clear.  Patient has moderate sized 2+ cryptic tonsils bilaterally but no acute exudate. Neck: No palpable adenopathy or masses Respiratory: Breathing comfortably  Skin: No facial/neck lesions or rash noted.  Procedures  Assessment: Chronic rhinitis Probably experiences some rhinitis medicamentosa from excessive use of decongestant spray. Septal deviation and turbinate hypertrophy. Questionable sinus disease.  Plan: Recommended regular use of nasal steroid spray every night either Nasacort or Flonase or Nasonex.  We will try to reduce the  decongestant spray use and use saline rinses during the daytime. We will schedule a CT scan of his sinuses to better evaluate the extent of sinus disease and have him follow-up in 1 month for recheck and audiologic testing to evaluate his hearing.  However presently there is no sign of middle ear effusion or conductive hearing loss.   Narda Bonds, MD   CC:

## 2020-12-03 ENCOUNTER — Ambulatory Visit (INDEPENDENT_AMBULATORY_CARE_PROVIDER_SITE_OTHER): Payer: 59 | Admitting: Otolaryngology

## 2020-12-04 ENCOUNTER — Other Ambulatory Visit: Payer: Self-pay | Admitting: Family Medicine

## 2020-12-04 DIAGNOSIS — E1165 Type 2 diabetes mellitus with hyperglycemia: Secondary | ICD-10-CM

## 2020-12-21 ENCOUNTER — Other Ambulatory Visit: Payer: Self-pay

## 2020-12-21 ENCOUNTER — Encounter: Payer: Self-pay | Admitting: Family Medicine

## 2020-12-21 ENCOUNTER — Ambulatory Visit: Payer: 59 | Admitting: Family Medicine

## 2020-12-21 VITALS — BP 108/70 | HR 93 | Temp 98.0°F | Resp 16 | Ht 75.5 in | Wt 259.8 lb

## 2020-12-21 DIAGNOSIS — Z1211 Encounter for screening for malignant neoplasm of colon: Secondary | ICD-10-CM | POA: Diagnosis not present

## 2020-12-21 DIAGNOSIS — E1169 Type 2 diabetes mellitus with other specified complication: Secondary | ICD-10-CM | POA: Diagnosis not present

## 2020-12-21 DIAGNOSIS — M545 Low back pain, unspecified: Secondary | ICD-10-CM

## 2020-12-21 DIAGNOSIS — R197 Diarrhea, unspecified: Secondary | ICD-10-CM

## 2020-12-21 DIAGNOSIS — Z6832 Body mass index (BMI) 32.0-32.9, adult: Secondary | ICD-10-CM

## 2020-12-21 DIAGNOSIS — E669 Obesity, unspecified: Secondary | ICD-10-CM

## 2020-12-21 LAB — POCT GLYCOSYLATED HEMOGLOBIN (HGB A1C): Hemoglobin A1C: 6.3 % — AB (ref 4.0–5.6)

## 2020-12-21 MED ORDER — DEXCOM G6 TRANSMITTER MISC: 1.0000 | 3 refills | Status: DC

## 2020-12-21 MED ORDER — EMPAGLIFLOZIN 25 MG PO TABS: 25.0000 mg | ORAL_TABLET | Freq: Every day | ORAL | 1 refills | Status: DC

## 2020-12-21 MED ORDER — DEXCOM G6 SENSOR MISC: 1 refills | Status: DC

## 2020-12-21 MED ORDER — OZEMPIC (1 MG/DOSE) 2 MG/1.5ML ~~LOC~~ SOPN: 1.0000 mg | PEN_INJECTOR | SUBCUTANEOUS | 3 refills | Status: DC

## 2020-12-21 NOTE — Progress Notes (Signed)
HPI: [name] is a 50 y.o. male, who is here today for 4 months follow up.   He was last seen on 10/01/20 for acute visit.  DM II: Dx'ed in 03/2020. He is on Ozempic 0.5 mg weekly and Synjardy 12.5-500 mg 2 tabs daily. Negative for polydipsia,polyuria, or polyphagia. He is using Dexcom, got it from a friend, has not changed sensor in 3-4 weeks and still seems to be accurate. BS's 80-120's, a few times in the 60's.  Last HgA1C was 11.2 in 06/2020.  Lab Results  Component Value Date   MICROALBUR 0.3 07/06/2020   MICROALBUR <0.7 05/30/2019   Lab Results  Component Value Date   CREATININE 1.14 07/06/2020   BUN 17 07/06/2020   NA 140 07/06/2020   K 4.5 07/06/2020   CL 103 07/06/2020   CO2 26 07/06/2020   He has been more careful with his diet. He has lost some wt.   Today he is c/o 1-2 weeks of watery stools, 5-6 daily. He has felt better for the past 2 days, symptoms seemed resolved. Denies associated fever,chills,N/V,abdominal pain,blood,or melena. His wife and daughter have similar symptoms at the same time after everybody ate at Friday's.Wife and daughter recovered after a few days. He completed abx treatment in 09/2020 for otitis. No sick contact or recent travel.  He states that since cholecystectomy he has loose stools associated with greasy food intake. He has had loose stools 2-3 times per week since Synjardy was started. Problem was worse when he was taking Metformin. Bloating sensation and odorous burping. Hx of GERD, he is on Pantoprazole, he has not had heartburn. OTC Gas X has helped.   Right-sided lower back pain not radiated that started 2 days ago. No hx of trauma. Exacerbated by knee extension, prolonged walking/standing.  Negative for saddle anesthesia,LE numbness/tingling,or bowel/bladder dysfunction. He has not taken OTC medication. Hx of back pain s/p lumbar spinal surgery.  Review of Systems  Constitutional: Negative for  activity change, appetite change, fatigue and fever.  HENT: Negative for mouth sores, nosebleeds and sore throat.   Respiratory: Negative for cough, shortness of breath and wheezing.   Cardiovascular: Negative for chest pain, palpitations and leg swelling.  Genitourinary: Negative for decreased urine volume, dysuria and hematuria.  Skin: Negative for pallor and rash.  Neurological: Negative for syncope, weakness and headaches.  Rest of ROS, see pertinent positives sand negatives in HPI  Current Outpatient Medications on File Prior to Visit  Medication Sig Dispense Refill  . atorvastatin (LIPITOR) 20 MG tablet Take 1 tablet (20 mg total) by mouth daily. 90 tablet 3  . fexofenadine-pseudoephedrine (ALLEGRA-D 24) 180-240 MG 24 hr tablet Take 1 tablet by mouth daily. 30 tablet 11  . mometasone (NASONEX) 50 MCG/ACT nasal spray Place 2 sprays into the nose daily. 1 each 2  . pantoprazole (PROTONIX) 40 MG tablet Take 1 tablet (40 mg total) by mouth daily. 30 tablet 3  . [DISCONTINUED] montelukast (SINGULAIR) 10 MG tablet Take 1 tablet (10 mg total) by mouth at bedtime. (Patient not taking: Reported on 09/27/2014) 30 tablet 11   No current facility-administered medications on file prior to visit.   Past Medical History:  Diagnosis Date  . Allergy   . Arthritis   . Complication of anesthesia    years ago used to hyperventilate no recent problems   . GERD (gastroesophageal reflux disease)   . History of chicken pox    Allergies  Allergen Reactions  .  Oxycodone Shortness Of Breath and Other (See Comments)    Hallucinations  . Vicodin [Hydrocodone-Acetaminophen] Shortness Of Breath and Other (See Comments)    Hallucinations    Social History   Socioeconomic History  . Marital status: Married    Spouse name: Not on file  . Number of children: Not on file  . Years of education: Not on file  . Highest education level: Not on file  Occupational History  . Not on file  Tobacco Use  .  Smoking status: Former Smoker    Packs/day: 0.75    Years: 20.00    Pack years: 15.00    Types: Cigarettes, Cigars    Quit date: 08/25/2012    Years since quitting: 8.3  . Smokeless tobacco: Former Neurosurgeon    Types: Snuff    Quit date: 08/26/2015  Vaping Use  . Vaping Use: Every day  . Start date: 04/04/2018  Substance and Sexual Activity  . Alcohol use: Yes    Alcohol/week: 3.0 standard drinks    Types: 3 Cans of beer per week    Comment: Occasionally  . Drug use: No  . Sexual activity: Yes    Birth control/protection: Condom  Other Topics Concern  . Not on file  Social History Narrative  . Not on file   Social Determinants of Health   Financial Resource Strain: Not on file  Food Insecurity: Not on file  Transportation Needs: Not on file  Physical Activity: Not on file  Stress: Not on file  Social Connections: Not on file    Vitals:   12/21/20 1521  BP: 108/70  Pulse: 93  Resp: 16  Temp: 98 F (36.7 C)  SpO2: 96%   Wt Readings from Last 3 Encounters:  12/21/20 259 lb 12.8 oz (117.8 kg)  10/01/20 268 lb 6 oz (121.7 kg)  09/21/20 273 lb (123.8 kg)   Body mass index is 32.04 kg/m.  Physical Exam Vitals and nursing note reviewed.  Constitutional:      General: He is not in acute distress.    Appearance: He is well-developed.  HENT:     Head: Normocephalic and atraumatic.     Mouth/Throat:     Mouth: Mucous membranes are moist.     Pharynx: Oropharynx is clear.  Eyes:     Conjunctiva/sclera: Conjunctivae normal.  Cardiovascular:     Rate and Rhythm: Normal rate and regular rhythm.     Pulses:          Dorsalis pedis pulses are 2+ on the right side and 2+ on the left side.     Heart sounds: No murmur heard.   Pulmonary:     Effort: Pulmonary effort is normal. No respiratory distress.     Breath sounds: Normal breath sounds.  Abdominal:     Palpations: Abdomen is soft. There is no hepatomegaly or mass.     Tenderness: There is no abdominal tenderness.   Musculoskeletal:     Lumbar back: No tenderness or bony tenderness. Negative left straight leg raise test.     Comments: Right SLR elicits right lower back pain.  Lymphadenopathy:     Cervical: No cervical adenopathy.  Skin:    General: Skin is warm.     Findings: No erythema or rash.  Neurological:     Mental Status: He is alert and oriented to person, place, and time.     Cranial Nerves: No cranial nerve deficit.     Gait: Gait normal.  Deep Tendon Reflexes:     Reflex Scores:      Patellar reflexes are 2+ on the right side and 2+ on the left side. Psychiatric:     Comments: Well groomed, good eye contact.   ASSESSMENT AND PLAN:  Mr. Brendan Macdonald. was seen today for 4 months follow-up.  Orders Placed This Encounter  Procedures  . Stool culture  . Ova and parasite examination  . Basic metabolic panel  . Ambulatory referral to Gastroenterology  . POC HgB A1c   Lab Results  Component Value Date   HGBA1C 6.3 (A) 12/21/2020   Lab Results  Component Value Date   CREATININE 1.14 12/21/2020   BUN 12 12/21/2020   NA 140 12/21/2020   K 3.6 12/21/2020   CL 106 12/21/2020   CO2 23 12/21/2020   Diarrhea, unspecified type We discussed possible etiologies. It seems like acute episode has resolved. S/P cholecystectomy, recommend avoiding trigger factor Metformin can also be a contributing factors and even thought it seems better tolerated with empagliflozin. Metformin discintinued. Adequate hydration. Stool Cx and parasite ordered. Instructed about warning signs.  Colon cancer screening -     Ambulatory referral to Gastroenterology; Future  Right-sided low back pain without sciatica, unspecified chronicity Acute exacerbation, mild. I do not think imaging is needed today. Tylenol 500 mg 3-4 times per day prn.  Type 2 diabetes mellitus with other specified complication (HCC) HgA1C is now at goal. Metformin could be aggravating diarrhea, so  discontinue. Jardiance 25 mg daily, samples given. Ozempic increased from 0.5 mg to 1 mg weekly. Regular exercise and healthy diet with avoidance of added sugar food intake is an important part of treatment and recommended. Annual eye exam, periodic dental and foot care recommended. F/U in 4 months   Class 1 obesity with body mass index (BMI) of 32.0 to 32.9 in adult He has lost about 9 Lb sine 09/2020. We discussed benefits of wt loss as well as adverse effects of obesity. Consistency with healthy diet and physical activity recommended.  Spent 41 minutes.  During this time history was obtained and documented, examination was performed, prior labs reviewed, and assessment/plan discussed.  Return in about 4 months (around 04/22/2021).   Thales Knipple G. [place], MD  [facility]. [place] office.   A few things to remember from today's visit:  Type 2 diabetes mellitus with hyperglycemia, without long-term current use of insulin (HCC) - Plan: POC HgB A1c, Continuous Blood Gluc Sensor (DEXCOM G6 SENSOR) MISC, Continuous Blood Gluc Transmit (DEXCOM G6 TRANSMITTER) MISC, Basic metabolic panel, empagliflozin (JARDIANCE) 25 MG TABS tablet  Diarrhea, unspecified type - Plan: Stool culture, Ova and parasite examination  Colon cancer screening - Plan: Ambulatory referral to Gastroenterology  Chronic right-sided low back pain without sciatica  If you need refills please call your pharmacy. Do not use My Chart to request refills or for acute issues that need immediate attention.   Synjardy discontinued. Jardiance 25 mg to continue. Increase Ozempic to 1 mg. For the back pain you can take Tylenol and occasionally Aleve or Ibuprofen. Please be sure medication list is accurate.  If a new problem present, please set up appointment sooner than planned today.

## 2020-12-21 NOTE — Assessment & Plan Note (Signed)
He has lost about 9 Lb sine 09/2020. We discussed benefits of wt loss as well as adverse effects of obesity. Consistency with healthy diet and physical activity recommended.

## 2020-12-21 NOTE — Assessment & Plan Note (Signed)
HgA1C is now at goal. Metformin could be aggravating diarrhea, so discontinue. Jardiance 25 mg daily, samples given. Ozempic increased from 0.5 mg to 1 mg weekly. Regular exercise and healthy diet with avoidance of added sugar food intake is an important part of treatment and recommended. Annual eye exam, periodic dental and foot care recommended. F/U in 4 months

## 2020-12-21 NOTE — Patient Instructions (Addendum)
A few things to remember from today's visit:  Type 2 diabetes mellitus with hyperglycemia, without long-term current use of insulin (HCC) - Plan: POC HgB A1c, Continuous Blood Gluc Sensor (DEXCOM G6 SENSOR) MISC, Continuous Blood Gluc Transmit (DEXCOM G6 TRANSMITTER) MISC, Basic metabolic panel, empagliflozin (JARDIANCE) 25 MG TABS tablet  Diarrhea, unspecified type - Plan: Stool culture, Ova and parasite examination  Colon cancer screening - Plan: Ambulatory referral to Gastroenterology  Chronic right-sided low back pain without sciatica  If you need refills please call your pharmacy. Do not use My Chart to request refills or for acute issues that need immediate attention.   Synjardy discontinued. Jardiance 25 mg to continue. Increase Ozempic to 1 mg. For the back pain you can take Tylenol and occasionally Aleve or Ibuprofen. Please be sure medication list is accurate.  If a new problem present, please set up appointment sooner than planned today.

## 2020-12-22 LAB — BASIC METABOLIC PANEL
BUN: 12 mg/dL (ref 7–25)
CO2: 23 mmol/L (ref 20–32)
Calcium: 9.8 mg/dL (ref 8.6–10.3)
Chloride: 106 mmol/L (ref 98–110)
Creat: 1.14 mg/dL (ref 0.60–1.35)
Glucose, Bld: 129 mg/dL — ABNORMAL HIGH (ref 65–99)
Potassium: 3.6 mmol/L (ref 3.5–5.3)
Sodium: 140 mmol/L (ref 135–146)

## 2021-01-22 ENCOUNTER — Telehealth (INDEPENDENT_AMBULATORY_CARE_PROVIDER_SITE_OTHER): Payer: 59 | Admitting: Family Medicine

## 2021-01-22 ENCOUNTER — Encounter: Payer: Self-pay | Admitting: Family Medicine

## 2021-01-22 VITALS — Ht 75.5 in

## 2021-01-22 DIAGNOSIS — R197 Diarrhea, unspecified: Secondary | ICD-10-CM | POA: Diagnosis not present

## 2021-01-22 DIAGNOSIS — R1013 Epigastric pain: Secondary | ICD-10-CM | POA: Diagnosis not present

## 2021-01-22 DIAGNOSIS — E1169 Type 2 diabetes mellitus with other specified complication: Secondary | ICD-10-CM

## 2021-01-22 DIAGNOSIS — R140 Abdominal distension (gaseous): Secondary | ICD-10-CM

## 2021-01-22 NOTE — Progress Notes (Signed)
Virtual Visit via Video Note I connected with Brendan Macdonald on 01/22/21 by a video enabled telemedicine application and verified that I am speaking with the correct person using two identifiers.  Location patient: home Location provider:work office Persons participating in the virtual visit: patient, provider  I discussed the limitations of evaluation and management by telemedicine and the availability of in person appointments. The patient expressed understanding and agreed to proceed.  Chief Complaint  Patient presents with  . Medication Problem    Excessive gas & burping   HPI: Brendan Macdonald is a 50 year old male with history of allergies, OA, GERD, DM 2, and hyperlipidemia complaining of loose stools, bloating sensation, and "sulfa" burping.  He has not had symptoms since he started Ozempic. He did not take Ozempic as a scheduled 3 days ago. Currently he is on Jardiance 25 mg daily. OTC Gas-X helps with bloating sensation.  Diarrhea improved some after discontinuing metformin. BS 90s to 120s. No recent travel sick exposure, or antibiotic treatment. "Little" upper abdominal discomfort, not radiated, burning/stubbing,3-4/10,intermittent. He has not identified exacerbating or alleviating factors. GERD: Currently on Protonix 40 mg daily. Negative for heartburn and dysphagia.  Lab Results  Component Value Date   TSH 1.13 03/28/2019   Stool culture and ova/parasite ordered in 11/2020 were not done.  He has not received a call for his colonoscopy.  Negative for fever, chills, abnormal weight loss, nausea, vomiting, melena, blood in the stool, or urinary symptoms.  ROS: See pertinent positives and negatives per HPI.  Past Medical History:  Diagnosis Date  . Allergy   . Arthritis   . Complication of anesthesia    years ago used to hyperventilate no recent problems   . GERD (gastroesophageal reflux disease)   . History of chicken pox     Past Surgical History:  Procedure  Laterality Date  . Arthroscopy  Bilateral    Knees  . BACK SURGERY    . CHOLECYSTECTOMY N/A 02/24/2013   Procedure: LAPAROSCOPIC CHOLECYSTECTOMY;  Surgeon: Dalia Heading, MD;  Location: [facility];  Service: General;  Laterality: N/A;  . EXAM UNDER ANESTHESIA WITH MANIPULATION OF KNEE Right 03/23/2015   Procedure: RIGHT EXAM UNDER ANESTHESIA WITH MANIPULATION UNDER ANESTHESIA;  Surgeon: Jene Every, MD;  Location: [facility];  Service: Orthopedics;  Laterality: Right;  . KNEE ARTHROSCOPY WITH PATELLAR TENDON REPAIR Right 03/23/2015   Procedure: RIGHT KNEE ARTHROSCOPY WITH DEBRIDEMENT;  Surgeon: Jene Every, MD;  Location: [facility];  Service: Orthopedics;  Laterality: Right;  . LYSIS OF ADHESION Right 03/23/2015   Procedure: LYSIS OF SCAR TISSUE;  Surgeon: Jene Every, MD;  Location: [facility];  Service: Orthopedics;  Laterality: Right;  . ORTHOPEDIC SURGERY     left knee quad tendon tear and patella tear   . QUADRICEPS TENDON REPAIR Right 10/18/2014   Procedure: RIGHT QUADRICEP TENDON REPAIR ;  Surgeon: Javier Docker, MD;  Location: [facility];  Service: Orthopedics;  Laterality: Right;  . ROTATOR CUFF REPAIR Right     Family History  Problem Relation Age of Onset  . Cancer Mother        ovarian and abdomen  . Diabetes Mother   . Hypertension Mother   . Arthritis Mother   . Heart disease Father   . Hypertension Father   . Cancer Father        kidney and bladder   . Arthritis Father   . Hearing loss Father   . Kidney disease Father   . Arthritis Sister   .  Stroke Maternal Grandmother   . Stroke Paternal Grandmother   . Arthritis Sister   . Lupus Sister     Social History   Socioeconomic History  . Marital status: Married    Spouse name: Not on file  . Number of children: Not on file  . Years of education: Not on file  . Highest education level: Not on file  Occupational History  . Not on file  Tobacco Use  . Smoking status: Former Smoker    Packs/day: 0.75    Years: 20.00     Pack years: 15.00    Types: Cigarettes, Cigars    Quit date: 08/25/2012    Years since quitting: 8.4  . Smokeless tobacco: Former Neurosurgeon    Types: Snuff    Quit date: 08/26/2015  Vaping Use  . Vaping Use: Every day  . Start date: 04/04/2018  Substance and Sexual Activity  . Alcohol use: Yes    Alcohol/week: 3.0 standard drinks    Types: 3 Cans of beer per week    Comment: Occasionally  . Drug use: No  . Sexual activity: Yes    Birth control/protection: Condom  Other Topics Concern  . Not on file  Social History Narrative  . Not on file   Social Determinants of Health   Financial Resource Strain: Not on file  Food Insecurity: Not on file  Transportation Needs: Not on file  Physical Activity: Not on file  Stress: Not on file  Social Connections: Not on file  Intimate Partner Violence: Not on file    Current Outpatient Medications:  .  atorvastatin (LIPITOR) 20 MG tablet, Take 1 tablet (20 mg total) by mouth daily., Disp: 90 tablet, Rfl: 3 .  Continuous Blood Gluc Sensor (DEXCOM G6 SENSOR) MISC, Change sensor q 10 days., Disp: 3 each, Rfl: 1 .  Continuous Blood Gluc Transmit (DEXCOM G6 TRANSMITTER) MISC, 1 Device by Does not apply route every 3 (three) months., Disp: 1 each, Rfl: 3 .  empagliflozin (JARDIANCE) 25 MG TABS tablet, Take 1 tablet (25 mg total) by mouth daily with breakfast., Disp: 90 tablet, Rfl: 1 .  fexofenadine-pseudoephedrine (ALLEGRA-D 24) 180-240 MG 24 hr tablet, Take 1 tablet by mouth daily., Disp: 30 tablet, Rfl: 11 .  mometasone (NASONEX) 50 MCG/ACT nasal spray, Place 2 sprays into the nose daily., Disp: 1 each, Rfl: 2 .  pantoprazole (PROTONIX) 40 MG tablet, Take 1 tablet (40 mg total) by mouth daily., Disp: 30 tablet, Rfl: 3 .  Semaglutide, 1 MG/DOSE, (OZEMPIC, 1 MG/DOSE,) 2 MG/1.5ML SOPN, Inject 1 mg into the skin once a week., Disp: 3 mL, Rfl: 3  EXAM:  VITALS per patient if applicable:Ht 6' 3.5" (1.918 m)   BMI 32.04 kg/m   GENERAL: alert, oriented,  appears well and in no acute distress  HEENT: atraumatic, conjunctiva clear, no obvious abnormalities on inspection.  NECK: normal movements of the head and neck  LUNGS: on inspection no signs of respiratory distress, breathing rate appears normal, no obvious gross SOB, gasping or wheezing  CV: no obvious cyanosis  MS: moves all visible extremities without noticeable abnormality  PSYCH/NEURO: pleasant and cooperative, no obvious depression or anxiety, speech and thought processing grossly intact  ASSESSMENT AND PLAN:  Discussed the following assessment and plan:  Epigastric abdominal pain We discussed possible etiologies. Pain is mild, so for now we will hold on blood work. Discontinue Ozempic. Clearly instructed about warning signs.  Abdominal bloating Continue OTC Gas-X or Beano. Attributed to Ozempic, so  discontinued. If not resolved in 3 weeks, he can hold on Jardiance and monitor for changes. We discussed differential diagnosis.  Diarrhea, unspecified type Improved after discontinuation of metformin. We discussed possible etiologies. ?  IBS. He thinks Ozempic is contributing to this problem, so discontinued. Adequate hydration.  If diarrhea is not better, stool cultures (including C. difficile) and blood work will be necessary. Will also consider GI evaluation. Pending appointment for colonoscopy.  Type 2 diabetes mellitus with other specified complication, without long-term current use of insulin (HCC) Ozempic discontinued due to possible side effects, he will let me know if symptoms resolve after 2-3 weeks. Continue Jardiance 25 mg daily. We discussed other treatment options. Continue monitoring BS closely.  We discussed possible serious and likely etiologies, options for evaluation and workup, limitations of telemedicine visit vs in person visit, treatment, treatment risks and precautions.  I discussed the assessment and treatment plan with the patient. Brendan Macdonald was provided an opportunity to ask questions and all were answered. He agreed with the plan and demonstrated an understanding of the instructions.   Return if symptoms worsen or fail to improve, for Keep next appt..   Brendan Whitehorn [place], MD

## 2021-03-18 ENCOUNTER — Encounter: Payer: Self-pay | Admitting: Family Medicine

## 2021-03-30 ENCOUNTER — Encounter: Payer: Self-pay | Admitting: Family Medicine

## 2021-04-01 ENCOUNTER — Other Ambulatory Visit: Payer: Self-pay | Admitting: Family Medicine

## 2021-04-01 MED ORDER — SYNJARDY XR 25-1000 MG PO TB24: 1.0000 | ORAL_TABLET | Freq: Every day | ORAL | 2 refills | Status: DC

## 2021-04-24 ENCOUNTER — Ambulatory Visit: Payer: 59 | Admitting: Family Medicine

## 2021-05-03 ENCOUNTER — Other Ambulatory Visit: Payer: Self-pay

## 2021-05-03 ENCOUNTER — Encounter: Payer: Self-pay | Admitting: Family Medicine

## 2021-05-03 ENCOUNTER — Ambulatory Visit: Payer: 59 | Admitting: Family Medicine

## 2021-05-03 VITALS — BP 120/70 | HR 90 | Resp 16 | Ht 75.5 in | Wt 260.0 lb

## 2021-05-03 DIAGNOSIS — R197 Diarrhea, unspecified: Secondary | ICD-10-CM

## 2021-05-03 DIAGNOSIS — Z6832 Body mass index (BMI) 32.0-32.9, adult: Secondary | ICD-10-CM

## 2021-05-03 DIAGNOSIS — J302 Other seasonal allergic rhinitis: Secondary | ICD-10-CM | POA: Diagnosis not present

## 2021-05-03 DIAGNOSIS — E1169 Type 2 diabetes mellitus with other specified complication: Secondary | ICD-10-CM

## 2021-05-03 DIAGNOSIS — E6609 Other obesity due to excess calories: Secondary | ICD-10-CM

## 2021-05-03 DIAGNOSIS — J358 Other chronic diseases of tonsils and adenoids: Secondary | ICD-10-CM

## 2021-05-03 LAB — POCT GLYCOSYLATED HEMOGLOBIN (HGB A1C): Hemoglobin A1C: 7.9 % — AB (ref 4.0–5.6)

## 2021-05-03 MED ORDER — MONTELUKAST SODIUM 10 MG PO TABS: 10.0000 mg | ORAL_TABLET | Freq: Every day | ORAL | 4 refills | Status: DC

## 2021-05-03 MED ORDER — MOMETASONE FUROATE 50 MCG/ACT NA SUSP: 2.0000 | Freq: Every day | NASAL | 3 refills | Status: DC

## 2021-05-03 NOTE — Patient Instructions (Addendum)
A few things to remember from today's visit:   Type 2 diabetes mellitus with other specified complication, without long-term current use of insulin (HCC) - Plan: POC HgB A1c  Seasonal allergic rhinitis, unspecified trigger - Plan: montelukast (SINGULAIR) 10 MG tablet  Tonsil stone  If you need refills please call your pharmacy. Do not use My Chart to request refills or for acute issues that need immediate attention.   Try low dose Trulicity, 0.75 mg,weekly. Let me know about tolerance and insurance coverage. If not cover we can go back to ozempic but lower dose, 0.25 mg weekly.  Singulair 10 mg at bedtime and Nasonex nasal spray daily as needed. Nasal saline irrigations as needed.  Please be sure medication list is accurate. If a new problem present, please set up appointment sooner than planned today.

## 2021-05-03 NOTE — Assessment & Plan Note (Addendum)
HgA1C went from 6.3 to 7.9. Samples of Trulicity 0.75 mg given today. He will consider going back to Ozempic lower dose depending of tolerance and health insurance coverage. Regular exercise and healthy diet with avoidance of added sugar food intake is an important part of treatment and recommended. Annual eye exam, periodic dental and foot care recommended. F/U in 4 months

## 2021-05-03 NOTE — Assessment & Plan Note (Signed)
We discussed benefits of wt loss as well as adverse effects of obesity. Consistency with healthy diet and physical activity recommended.  

## 2021-05-03 NOTE — Progress Notes (Signed)
HPI: [name] is a 50 y.o. male, who is here today for 5 months follow up.  He was last seen on 01/22/2021 for acute visit.  Diabetes Mellitus II: Diagnosed in 03/2020. - Checking BG at home: 150-170's. 230-240's depending of what he eats. - Medications: Empagliflozin-metformin ER 25-1000 mg daily.  He stopped Ozempic about 3 months ago because it was causing some GI symptoms.   - Compliance: Good. - Diet: He is trying to improve dietary habits. - Exercise: He is not doing so consistently. - eye exam: Overdue. - foot exam: 08/2020.  - Negative for symptoms of hypoglycemia, polyuria, polydipsia, numbness extremities, foot ulcers/trauma  Lab Results  Component Value Date   HGBA1C 6.3 (A) 12/21/2020   Lab Results  Component Value Date   MICROALBUR 0.3 07/06/2020   Diarrhea: Problem have improved, it is exacerbated by certain foods and with medications. He is not longer having abdominal pain. + Bloating sensation. Symptoms alleviated by defecation. He has not had colonoscopy done.  Allergic rhinitis: He has not taken Allegra D, usually it helps temporarily. He is not using Nasonex nasal spray. OTC Zicam nasal spray helps but it is expensive.  Accoridng to pt, ENT recommend surgical treatment but he is not interested in doing so. Also c/o odorous "spots" on tonsils,which he calls "infection." Rounded yellowish material in tonsils, intermittent. He thinks this is caused by infectious process. Negative for fever, chills,sore throat,or dysphagia.   Review of Systems  Constitutional:  Positive for fatigue. Negative for activity change, appetite change and fever.  HENT:  Positive for congestion, postnasal drip and rhinorrhea. Negative for facial swelling, mouth sores, nosebleeds and sinus pain.   Eyes:  Negative for redness and visual disturbance.  Respiratory:  Negative for cough, shortness of breath and wheezing.   Cardiovascular:  Negative for chest pain,  palpitations and leg swelling.  Gastrointestinal:  Negative for abdominal pain, nausea and vomiting.  Genitourinary:  Negative for decreased urine volume, dysuria and hematuria.  Musculoskeletal:  Negative for gait problem and myalgias.  Skin:  Negative for rash and wound.  Allergic/Immunologic: Positive for environmental allergies.  Neurological:  Negative for seizures, weakness, numbness and headaches.  Rest of ROS, see pertinent positives sand negatives in HPI  Current Outpatient Medications on File Prior to Visit  Medication Sig Dispense Refill   atorvastatin (LIPITOR) 20 MG tablet Take 1 tablet (20 mg total) by mouth daily. 90 tablet 3   Continuous Blood Gluc Sensor (DEXCOM G6 SENSOR) MISC Change sensor q 10 days. 3 each 1   Continuous Blood Gluc Transmit (DEXCOM G6 TRANSMITTER) MISC 1 Device by Does not apply route every 3 (three) months. 1 each 3   Empagliflozin-metFORMIN HCl ER (SYNJARDY XR) 25-1000 MG TB24 Take 1 tablet by mouth daily with breakfast. 30 tablet 2   fexofenadine-pseudoephedrine (ALLEGRA-D 24) 180-240 MG 24 hr tablet Take 1 tablet by mouth daily. 30 tablet 11   pantoprazole (PROTONIX) 40 MG tablet Take 1 tablet (40 mg total) by mouth daily. 30 tablet 3   No current facility-administered medications on file prior to visit.   Past Medical History:  Diagnosis Date   Allergy    Arthritis    Complication of anesthesia    years ago used to hyperventilate no recent problems    GERD (gastroesophageal reflux disease)    History of chicken pox    Allergies  Allergen Reactions   Oxycodone Shortness Of Breath and Other (See Comments)  Hallucinations   Vicodin [Hydrocodone-Acetaminophen] Shortness Of Breath and Other (See Comments)    Hallucinations    Social History   Socioeconomic History   Marital status: Married    Spouse name: Not on file   Number of children: Not on file   Years of education: Not on file   Highest education level: Not on file   Occupational History   Not on file  Tobacco Use   Smoking status: Former    Packs/day: 0.75    Years: 20.00    Pack years: 15.00    Types: Cigarettes, Cigars    Quit date: 08/25/2012    Years since quitting: 8.6   Smokeless tobacco: Former    Types: Snuff    Quit date: 08/26/2015  Vaping Use   Vaping Use: Every day   Start date: 04/04/2018  Substance and Sexual Activity   Alcohol use: Yes    Alcohol/week: 3.0 standard drinks    Types: 3 Cans of beer per week    Comment: Occasionally   Drug use: No   Sexual activity: Yes    Birth control/protection: Condom  Other Topics Concern   Not on file  Social History Narrative   Not on file   Social Determinants of Health   Financial Resource Strain: Not on file  Food Insecurity: Not on file  Transportation Needs: Not on file  Physical Activity: Not on file  Stress: Not on file  Social Connections: Not on file   Vitals:   05/03/21 1433  BP: 120/70  Pulse: 90  Resp: 16  SpO2: 98%   Wt Readings from Last 3 Encounters:  05/03/21 260 lb (117.9 kg)  12/21/20 259 lb 12.8 oz (117.8 kg)  10/01/20 268 lb 6 oz (121.7 kg)   Body mass index is 32.07 kg/m.  Physical Exam Vitals and nursing note reviewed.  Constitutional:      General: He is not in acute distress.    Appearance: He is well-developed.  HENT:     Head: Normocephalic and atraumatic.     Nose: Rhinorrhea present.     Right Nostril: Occlusion present.     Right Turbinates: Enlarged.     Left Turbinates: Enlarged.     Right Sinus: No maxillary sinus tenderness or frontal sinus tenderness.     Left Sinus: No maxillary sinus tenderness or frontal sinus tenderness.     Mouth/Throat:     Mouth: Mucous membranes are moist.     Pharynx: Oropharynx is clear.      Comments: Small tonsil stone, left side. Eyes:     Conjunctiva/sclera: Conjunctivae normal.  Cardiovascular:     Rate and Rhythm: Normal rate and regular rhythm.     Pulses:          Dorsalis pedis pulses  are 2+ on the right side and 2+ on the left side.     Heart sounds: No murmur heard. Pulmonary:     Effort: Pulmonary effort is normal. No respiratory distress.     Breath sounds: Normal breath sounds.  Abdominal:     Palpations: Abdomen is soft. There is no hepatomegaly or mass.     Tenderness: There is no abdominal tenderness.  Lymphadenopathy:     Cervical: No cervical adenopathy.  Skin:    General: Skin is warm.     Findings: No erythema or rash.  Neurological:     Mental Status: He is alert and oriented to person, place, and time.     Cranial Nerves:  No cranial nerve deficit.     Gait: Gait normal.  Psychiatric:     Comments: Well groomed, good eye contact.  ASSESSMENT AND PLAN:  Mr. Brendan Macdonald. was seen today for 5 months follow-up.  Orders Placed This Encounter  Procedures   POC HgB A1c   Lab Results  Component Value Date   HGBA1C 7.9 (A) 05/03/2021    Type 2 diabetes mellitus with other specified complication (HCC) HgA1C went from 6.3 to 7.9. Samples of Trulicity 0.75 mg given today. He will consider going back to Ozempic lower dose depending of tolerance and health insurance coverage. Regular exercise and healthy diet with avoidance of added sugar food intake is an important part of treatment and recommended. Annual eye exam, periodic dental and foot care recommended. F/U in 4 months   Class 1 obesity with body mass index (BMI) of 32.0 to 32.9 in adult We discussed benefits of wt loss as well as adverse effects of obesity. Consistency with healthy diet and physical activity recommended.   Rhinitis Problem is not well controlled. Singulair 10 mg added at bedtime. Nasonex nasal spray daily prn. Nasal saline irrigations as needed. OTC antihistaminic daily prn and Zicam bid prn.  Tonsil stone Educated about Dx, reassured about infectious process. Monitor for new symptoms.  Diarrhea, unspecified type Improved. This is a chronic problem. ?  IBS. Continue avoiding foods that could aggravate problems. 2nd GI referral placed in 11/2020, due to work schedule he has not re-schedules procedure.  I spent a total of 42 minutes in both face to face and non face to face activities for this visit on the date of this encounter. During this time history was obtained and documented, examination was performed, prior labs reviewed, and assessment/plan discussed.  Return in about 4 months (around 09/02/2021).   Harmoni Lucus G. [place], MD  [facility]. [place] office.

## 2021-05-03 NOTE — Assessment & Plan Note (Addendum)
Problem is not well controlled. Singulair 10 mg added at bedtime. Nasonex nasal spray daily prn. Nasal saline irrigations as needed. OTC antihistaminic daily prn and Zicam bid prn.

## 2021-05-04 ENCOUNTER — Encounter: Payer: Self-pay | Admitting: Family Medicine

## 2021-05-06 ENCOUNTER — Other Ambulatory Visit: Payer: Self-pay | Admitting: Family Medicine

## 2021-05-06 DIAGNOSIS — K219 Gastro-esophageal reflux disease without esophagitis: Secondary | ICD-10-CM

## 2021-08-09 ENCOUNTER — Other Ambulatory Visit: Payer: Self-pay | Admitting: Family Medicine

## 2021-08-31 ENCOUNTER — Encounter: Payer: Self-pay | Admitting: Family Medicine

## 2021-08-31 DIAGNOSIS — E785 Hyperlipidemia, unspecified: Secondary | ICD-10-CM

## 2021-08-31 DIAGNOSIS — E1169 Type 2 diabetes mellitus with other specified complication: Secondary | ICD-10-CM

## 2021-09-02 MED ORDER — ATORVASTATIN CALCIUM 20 MG PO TABS: 20.0000 mg | ORAL_TABLET | Freq: Every day | ORAL | 3 refills | Status: DC

## 2021-09-05 NOTE — Progress Notes (Signed)
HPI: Brendan Macdonald. is a 51 y.o. male, who is here today to follow on recent visit. He was last sen on 05/03/2021.  DM II: Dx'ed in 03/2020. He is on Synjardy XR 25-1000 mg daily. He is tolerating medication well.  He is not checking BS's. Negative for abdominal pain, nausea,vomiting, polydipsia,polyuria, or polyphagia. Last eye exam over a year ago.  Lab Results  Component Value Date        MICROALBUR 0.3 07/06/2020   HgA1C 7.9 in 04/2021.  C/O ED for the past months, intermittent and not as strong. This is a new problem. He has not identified exacerbating or alleviating factors. No anatomical abnormalities or urinary symptoms. Decreased morning erections. Denies depressed mood,anxiety,decreased libido,or nipple discharge. Mild fatigue sometimes.  Concerned about prostate cancer. He is asking about cancer screening. He has not had colonoscopy done. GI referral placed in 11/2020.  Mentions that his father has renal and bladder cancer, former smoker. He has not tried OTC medications.  He thinks "it is time to have a stress test done." Bilateral chest discomfort, intermittently for 6-8 months, sharp. He thinks it is gas, if he burps pain resolves. Heartburn has resolved since PPI started. CP reoccurs when he skips PPI. No CP or dyspnea with exertion.  Review of Systems  Constitutional:  Negative for activity change, appetite change and fever.  HENT:  Negative for nosebleeds, sore throat and trouble swallowing.   Eyes:  Negative for redness and visual disturbance.  Respiratory:  Negative for cough and wheezing.   Cardiovascular:  Negative for palpitations and leg swelling.  Gastrointestinal:  Negative for blood in stool.       No changes in bowel habits.  Genitourinary:  Negative for decreased urine volume, dysuria and hematuria.  Neurological:  Negative for syncope, weakness and headaches.  Rest see pertinent positives and negatives per HPI.  Current  Outpatient Medications on File Prior to Visit  Medication Sig Dispense Refill   atorvastatin (LIPITOR) 20 MG tablet Take 1 tablet (20 mg total) by mouth daily. 90 tablet 3   Continuous Blood Gluc Sensor (DEXCOM G6 SENSOR) MISC Change sensor q 10 days. 3 each 1   Continuous Blood Gluc Transmit (DEXCOM G6 TRANSMITTER) MISC 1 Device by Does not apply route every 3 (three) months. 1 each 3   fexofenadine-pseudoephedrine (ALLEGRA-D 24) 180-240 MG 24 hr tablet Take 1 tablet by mouth daily. 30 tablet 11   mometasone (NASONEX) 50 MCG/ACT nasal spray Place 2 sprays into the nose daily. 1 each 3   montelukast (SINGULAIR) 10 MG tablet Take 1 tablet (10 mg total) by mouth at bedtime. 30 tablet 4   pantoprazole (PROTONIX) 40 MG tablet Take 1 tablet by mouth once daily 90 tablet 2   SYNJARDY XR 25-1000 MG TB24 Take 1 tablet by mouth once daily with breakfast 30 tablet 3   No current facility-administered medications on file prior to visit.   Past Medical History:  Diagnosis Date   Allergy    Arthritis    Complication of anesthesia    years ago used to hyperventilate no recent problems    GERD (gastroesophageal reflux disease)    History of chicken pox    Allergies  Allergen Reactions   Oxycodone Shortness Of Breath and Other (See Comments)    Hallucinations   Vicodin [Hydrocodone-Acetaminophen] Shortness Of Breath and Other (See Comments)    Hallucinations    Social History   Socioeconomic History   Marital status: Married  Spouse name: Not on file   Number of children: Not on file   Years of education: Not on file   Highest education level: Not on file  Occupational History   Not on file  Tobacco Use   Smoking status: Former    Packs/day: 0.75    Years: 20.00    Pack years: 15.00    Types: Cigarettes, Cigars    Quit date: 08/25/2012    Years since quitting: 9.0   Smokeless tobacco: Former    Types: Snuff    Quit date: 08/26/2015  Vaping Use   Vaping Use: Every day   Start date:  04/04/2018  Substance and Sexual Activity   Alcohol use: Yes    Alcohol/week: 3.0 standard drinks    Types: 3 Cans of beer per week    Comment: Occasionally   Drug use: No   Sexual activity: Yes    Birth control/protection: Condom  Other Topics Concern   Not on file  Social History Narrative   Not on file   Social Determinants of Health   Financial Resource Strain: Not on file  Food Insecurity: Not on file  Transportation Needs: Not on file  Physical Activity: Not on file  Stress: Not on file  Social Connections: Not on file   Vitals:   09/06/21 1351  BP: 130/70  Pulse: 71  Resp: 16  SpO2: 97%   Body mass index is 32.99 kg/m.  Physical Exam Vitals and nursing note reviewed.  Constitutional:      General: He is not in acute distress.    Appearance: He is well-developed.  HENT:     Head: Normocephalic and atraumatic.     Mouth/Throat:     Mouth: Mucous membranes are moist.  Eyes:     Conjunctiva/sclera: Conjunctivae normal.  Cardiovascular:     Rate and Rhythm: Normal rate and regular rhythm.     Pulses:          Dorsalis pedis pulses are 2+ on the right side and 2+ on the left side.     Heart sounds: No murmur heard. Pulmonary:     Effort: Pulmonary effort is normal. No respiratory distress.     Breath sounds: Normal breath sounds.  Abdominal:     Palpations: Abdomen is soft. There is no hepatomegaly or mass.     Tenderness: There is no abdominal tenderness.  Lymphadenopathy:     Cervical: No cervical adenopathy.  Skin:    General: Skin is warm.     Findings: No erythema or rash.  Neurological:     Mental Status: He is alert and oriented to person, place, and time.     Cranial Nerves: No cranial nerve deficit.     Gait: Gait normal.  Psychiatric:     Comments: Well groomed, good eye contact.   Diabetic Foot Exam - Simple   Simple Foot Form Diabetic Foot exam was performed with the following findings: Yes 09/06/2021  2:35 PM  Visual Inspection See  comments: Yes Sensation Testing Intact to touch and monofilament testing bilaterally: Yes Pulse Check Posterior Tibialis and Dorsalis pulse intact bilaterally: Yes Comments Calluses around toes and flat feet. Hypertrophic toenails.    ASSESSMENT AND PLAN:  [id number] was seen today for follow-up.  Diagnoses and all orders for this visit: Orders Placed This Encounter  Procedures   Flu Vaccine QUAD 71mo+IM (Fluarix, Fluzone & Alfiuria Quad PF)   Basic metabolic panel   TSH   Urinalysis, Routine w reflex microscopic  PSA   Testosterone   Microalbumin / creatinine urine ratio   Ambulatory referral to Gastroenterology   POC HgB A1c   EKG 12-Lead   Lab Results  Component Value Date   CREATININE 1.26 09/06/2021   BUN 13 09/06/2021   NA 142 09/06/2021   K 3.9 09/06/2021   CL 103 09/06/2021   CO2 31 09/06/2021   Lab Results  Component Value Date   TESTOSTERONE 185.08 (L) 09/06/2021   Lab Results  Component Value Date   TSH 1.54 09/06/2021   Lab Results  Component Value Date   HGBA1C 6.9 09/06/2021   Lab Results  Component Value Date   MICROALBUR <0.7 09/06/2021   MICROALBUR 0.3 07/06/2020   Lab Results  Component Value Date   PSA 0.59 09/06/2021   Chest discomfort We discussed possible etiologies. Hx does not suggest cardiac causes. GERD can be a contributing factor. He has CV risk factors: HLD,obesity,and DM II among some. EKG today NSR,normal axis  and intervals,no ST/T wave changes. Clearly instructed about warning signs.  Type 2 diabetes mellitus with other specified complication ([place]) 123456 improved, it is at goal. No changes in current management. Regular exercise and healthy diet with avoidance of added sugar food intake is an important part of treatment and recommended. Annual eye exam, periodic dental and foot care recommended. F/U in 5-6 months   Gastroesophageal reflux disease Problem is well controlled. Continue Protonix 40 mg daily. GERD  precautions also recommended.  Erectile dysfunction We discussed possible etiologies and treatment options. He prefers to hold on pharmacologic treatment. Further recommendations according to lab results.  Colon cancer screening -     Ambulatory referral to Gastroenterology  Other orders -     Flu Vaccine QUAD 52mo+IM (Fluarix, Fluzone & Alfiuria Quad PF)  Return in about 5 months (around 02/04/2022).  Garek Schuneman G. [place], MD  [facility]. [place] office.

## 2021-09-06 ENCOUNTER — Ambulatory Visit: Payer: 59 | Admitting: Family Medicine

## 2021-09-06 ENCOUNTER — Encounter: Payer: Self-pay | Admitting: Family Medicine

## 2021-09-06 VITALS — BP 130/70 | HR 71 | Resp 16 | Ht 75.5 in | Wt 267.5 lb

## 2021-09-06 DIAGNOSIS — E1169 Type 2 diabetes mellitus with other specified complication: Secondary | ICD-10-CM | POA: Diagnosis not present

## 2021-09-06 DIAGNOSIS — R7989 Other specified abnormal findings of blood chemistry: Secondary | ICD-10-CM

## 2021-09-06 DIAGNOSIS — Z1211 Encounter for screening for malignant neoplasm of colon: Secondary | ICD-10-CM | POA: Diagnosis not present

## 2021-09-06 DIAGNOSIS — R0789 Other chest pain: Secondary | ICD-10-CM | POA: Diagnosis not present

## 2021-09-06 DIAGNOSIS — N529 Male erectile dysfunction, unspecified: Secondary | ICD-10-CM | POA: Diagnosis not present

## 2021-09-06 DIAGNOSIS — Z23 Encounter for immunization: Secondary | ICD-10-CM

## 2021-09-06 DIAGNOSIS — K219 Gastro-esophageal reflux disease without esophagitis: Secondary | ICD-10-CM | POA: Diagnosis not present

## 2021-09-06 LAB — BASIC METABOLIC PANEL
BUN: 13 mg/dL (ref 6–23)
CO2: 31 mEq/L (ref 19–32)
Calcium: 9.5 mg/dL (ref 8.4–10.5)
Chloride: 103 mEq/L (ref 96–112)
Creatinine, Ser: 1.26 mg/dL (ref 0.40–1.50)
GFR: 67.04 mL/min (ref >60.00)
Glucose, Bld: 80 mg/dL (ref 70–99)
Potassium: 3.9 mEq/L (ref 3.5–5.1)
Sodium: 142 mEq/L (ref 135–145)

## 2021-09-06 LAB — POCT GLYCOSYLATED HEMOGLOBIN (HGB A1C): HbA1c, POC (controlled diabetic range): 6.9 % (ref 0.0–7.0)

## 2021-09-06 LAB — URINALYSIS, ROUTINE W REFLEX MICROSCOPIC
Bilirubin Urine: NEGATIVE
Hgb urine dipstick: NEGATIVE
Ketones, ur: NEGATIVE
Leukocytes,Ua: NEGATIVE
Nitrite: NEGATIVE
Specific Gravity, Urine: 1.015 (ref 1.000–1.030)
Total Protein, Urine: NEGATIVE
Urine Glucose: >=1000 — AB
Urobilinogen, UA: 0.2 (ref 0.0–1.0)
pH: 6.5 (ref 5.0–8.0)

## 2021-09-06 LAB — MICROALBUMIN / CREATININE URINE RATIO
Creatinine,U: 75.3 mg/dL
Microalb Creat Ratio: 0.9 mg/g (ref 0.0–30.0)
Microalb, Ur: <0.7 mg/dL (ref 0.0–1.9)

## 2021-09-06 LAB — PSA: PSA: 0.59 ng/mL (ref 0.10–4.00)

## 2021-09-06 LAB — TSH: TSH: 1.54 u[IU]/mL (ref 0.35–5.50)

## 2021-09-06 LAB — TESTOSTERONE: Testosterone: 185.08 ng/dL — ABNORMAL LOW (ref 300.00–890.00)

## 2021-09-06 NOTE — Assessment & Plan Note (Signed)
HgA1C improved, it is at goal. No changes in current management. Regular exercise and healthy diet with avoidance of added sugar food intake is an important part of treatment and recommended. Annual eye exam, periodic dental and foot care recommended. F/U in 5-6 months

## 2021-09-06 NOTE — Assessment & Plan Note (Signed)
We discussed possible etiologies and treatment options. He prefers to hold on pharmacologic treatment. Further recommendations according to lab results.

## 2021-09-06 NOTE — Assessment & Plan Note (Signed)
Problem is well controlled. Continue Protonix 40 mg daily. GERD precautions also recommended.

## 2021-09-06 NOTE — Patient Instructions (Signed)
A few things to remember from today's visit:   Type 2 diabetes mellitus with other specified complication, without long-term current use of insulin (HCC) - Plan: POC HgB A1c, Basic metabolic panel, Urinalysis, Routine w reflex microscopic, Microalbumin / creatinine urine ratio  Colon cancer screening - Plan: Ambulatory referral to Gastroenterology  Erectile dysfunction, unspecified erectile dysfunction type - Plan: TSH, PSA, Testosterone  If you need refills please call your pharmacy. Do not use My Chart to request refills or for acute issues that need immediate attention.   No changes in medications today. Next visit we can do fasting labs.  Please be sure medication list is accurate. If a new problem present, please set up appointment sooner than planned today. Erectile Dysfunction Erectile dysfunction (ED) is the inability to get or keep an erection in order to have sexual intercourse. ED is considered a symptom of an underlying disorder and is not considered a disease. ED may include: Inability to get an erection. Lack of enough hardness of the erection to allow penetration. Loss of erection before sex is finished. What are the causes? This condition may be caused by: Physical causes, such as: Artery problems. This may include heart disease, high blood pressure, atherosclerosis, and diabetes. Hormonal problems, such as low testosterone. Obesity. Nerve problems. This may include back or pelvic injuries, multiple sclerosis, Parkinson's disease, spinal cord injury, and stroke. Certain medicines, such as: Pain relievers. Antidepressants. Blood pressure medicines and water pills (diuretics). Cancer medicines. Antihistamines. Muscle relaxants. Lifestyle factors, such as: Use of drugs such as marijuana, cocaine, or opioids. Excessive use of alcohol. Smoking. Lack of physical activity or exercise. Psychological causes, such as: Anxiety or stress. Sadness or  depression. Exhaustion. Fear about sexual performance. Guilt. What are the signs or symptoms? Symptoms of this condition include: Inability to get an erection. Lack of enough hardness of the erection to allow penetration. Loss of the erection before sex is finished. Sometimes having normal erections, but with frequent unsatisfactory episodes. Low sexual satisfaction in either partner due to erection problems. A curved penis occurring with erection. The curve may cause pain, or the penis may be too curved to allow for intercourse. Never having nighttime or morning erections. How is this diagnosed? This condition is often diagnosed by: Performing a physical exam to find other diseases or specific problems with the penis. Asking you detailed questions about the problem. Doing tests, such as: Blood tests to check for diabetes mellitus or high cholesterol, or to measure hormone levels. Other tests to check for underlying health conditions. An ultrasound exam to check for scarring. A test to check blood flow to the penis. Doing a sleep study at home to measure nighttime erections. How is this treated? This condition may be treated by: Medicines, such as: Medicine taken by mouth to help you achieve an erection (oral medicine). Hormone replacement therapy to replace low testosterone levels. Medicine that is injected into the penis. Your health care provider may instruct you how to give yourself these injections at home. Medicine that is delivered with a short applicator tube. The tube is inserted into the opening at the tip of the penis, which is the opening of the urethra. A tiny pellet of medicine is put in the urethra. The pellet dissolves and enhances erectile function. This is also called MUSE (medicated urethral system for erections) therapy. Vacuum pump. This is a pump with a ring on it. The pump and ring are placed on the penis and used to create pressure that  helps the penis become  erect. Penile implant surgery. In this procedure, you may receive: An inflatable implant. This consists of cylinders, a pump, and a reservoir. The cylinders can be inflated with a fluid that helps to create an erection, and they can be deflated after intercourse. A semi-rigid implant. This consists of two silicone rubber rods. The rods provide some rigidity. They are also flexible, so the penis can both curve downward in its normal position and become straight for sexual intercourse. Blood vessel surgery to improve blood flow to the penis. During this procedure, a blood vessel from a different part of the body is placed into the penis to allow blood to flow around (bypass) damaged or blocked blood vessels. Lifestyle changes, such as exercising more, losing weight, and quitting smoking. Follow these instructions at home: Medicines  Take over-the-counter and prescription medicines only as told by your health care provider. Do not increase the dosage without first discussing it with your health care provider. If you are using self-injections, do injections as directed by your health care provider. Make sure you avoid any veins that are on the surface of the penis. After giving an injection, apply pressure to the injection site for 5 minutes. Talk to your health care provider about how to prevent headaches while taking ED medicines. These medicines may cause a sudden headache due to the increase in blood flow in your body. General instructions Exercise regularly, as directed by your health care provider. Work with your health care provider to lose weight, if needed. Do not use any products that contain nicotine or tobacco. These products include cigarettes, chewing tobacco, and vaping devices, such as e-cigarettes. If you need help quitting, ask your health care provider. Before using a vacuum pump, read the instructions that come with the pump and discuss any questions with your health care  provider. Keep all follow-up visits. This is important. Contact a health care provider if: You feel nauseous. You are vomiting. You get sudden headaches while taking ED medicines. You have any concerns about your sexual health. Get help right away if: You are taking oral or injectable medicines and you have an erection that lasts longer than 4 hours. If your health care provider is unavailable, go to the nearest emergency room for evaluation. An erection that lasts much longer than 4 hours can result in permanent damage to your penis. You have severe pain in your groin or abdomen. You develop redness or severe swelling of your penis. You have redness spreading at your groin or lower abdomen. You are unable to urinate. You experience chest pain or a rapid heartbeat (palpitations) after taking oral medicines. These symptoms may represent a serious problem that is an emergency. Do not wait to see if the symptoms will go away. Get medical help right away. Call your local emergency services (911 in the [place] Do not drive yourself to the hospital. Summary Erectile dysfunction (ED) is the inability to get or keep an erection during sexual intercourse. This condition is diagnosed based on a physical exam, your symptoms, and tests to determine the cause. Treatment varies depending on the cause and may include medicines, hormone therapy, surgery, or a vacuum pump. You may need follow-up visits to make sure that you are using your medicines or devices correctly. Get help right away if you are taking or injecting medicines and you have an erection that lasts longer than 4 hours. This information is not intended to replace advice given to you by your  health care provider. Make sure you discuss any questions you have with your health care provider. Document Revised: 11/07/2020 Document Reviewed: 11/07/2020 Elsevier Patient Education  2022 [employer]

## 2021-09-18 ENCOUNTER — Other Ambulatory Visit (INDEPENDENT_AMBULATORY_CARE_PROVIDER_SITE_OTHER): Payer: 59

## 2021-09-18 ENCOUNTER — Other Ambulatory Visit: Payer: Self-pay

## 2021-09-18 DIAGNOSIS — E1169 Type 2 diabetes mellitus with other specified complication: Secondary | ICD-10-CM

## 2021-09-18 DIAGNOSIS — E785 Hyperlipidemia, unspecified: Secondary | ICD-10-CM

## 2021-09-18 DIAGNOSIS — R7989 Other specified abnormal findings of blood chemistry: Secondary | ICD-10-CM

## 2021-09-18 LAB — TESTOSTERONE TOTAL,FREE,BIO, MALES
Albumin: 4.2 g/dL (ref 3.6–5.1)
Sex Hormone Binding: 11 nmol/L (ref 10–50)
Testosterone: 98 ng/dL — ABNORMAL LOW (ref 250–827)

## 2021-09-18 LAB — LIPID PANEL
Cholesterol: 127 mg/dL (ref 0–200)
HDL: 48.5 mg/dL (ref >39.00)
LDL Cholesterol: 61 mg/dL (ref 0–99)
NonHDL: 78.53
Total CHOL/HDL Ratio: 3
Triglycerides: 89 mg/dL (ref 0.0–149.0)
VLDL: 17.8 mg/dL (ref 0.0–40.0)

## 2021-09-19 ENCOUNTER — Encounter: Payer: Self-pay | Admitting: Family Medicine

## 2021-09-22 ENCOUNTER — Encounter: Payer: Self-pay | Admitting: Family Medicine

## 2021-09-23 ENCOUNTER — Telehealth (INDEPENDENT_AMBULATORY_CARE_PROVIDER_SITE_OTHER): Payer: 59 | Admitting: Family Medicine

## 2021-09-23 ENCOUNTER — Encounter: Payer: Self-pay | Admitting: Family Medicine

## 2021-09-23 DIAGNOSIS — J069 Acute upper respiratory infection, unspecified: Secondary | ICD-10-CM | POA: Diagnosis not present

## 2021-09-23 NOTE — Addendum Note (Signed)
Addended by: Claudette Laws D on: 09/23/2021 12:48 PM   Modules accepted: Orders

## 2021-09-23 NOTE — Progress Notes (Signed)
Virtual Visit via Video Note  I connected with Brendan Macdonald  on 09/23/21 at 11:30 AM EST by a video enabled telemedicine application 2/2 COVID-19 pandemic and verified that I am speaking with the correct person using two identifiers.  Location patient: home Location provider:work or home office Persons participating in the virtual visit: patient, provider  I discussed the limitations of evaluation and management by telemedicine and the availability of in person appointments. The patient expressed understanding and agreed to proceed. Chief Complaint  Patient presents with   Covid Positive    Tested with 2 at home tests, both positive. Has diabetes.      HPI: Pt felt like he was having SOB, sinus drainage, dry cough, stuffy nose since Wednesday 09/18/21.  Endorses chills, subjective fever, irritated throat, postnasal drainage.  Pt notes SOB when up moving around.   Feels like can take a "good deep breath in". Denies  N/V, diarrhea, chest tightness, sick contacts, h/o asthma. Pt tried dayquil, nyquil, Robitussin DM. Pt sent a mychart message stating he had a positive COVID test and wanted paxlovid, though repeatedly told this provider test were negative on Saturday and Sunday when asked several times.  ROS: See pertinent positives and negatives per HPI.  Past Medical History:  Diagnosis Date   Allergy    Arthritis    Complication of anesthesia    years ago used to hyperventilate no recent problems    GERD (gastroesophageal reflux disease)    History of chicken pox     Past Surgical History:  Procedure Laterality Date   Arthroscopy  Bilateral    Knees   BACK SURGERY     CHOLECYSTECTOMY N/A 02/24/2013   Procedure: LAPAROSCOPIC CHOLECYSTECTOMY;  Surgeon: Dalia Heading, MD;  Location: [facility];  Service: General;  Laterality: N/A;   EXAM UNDER ANESTHESIA WITH MANIPULATION OF KNEE Right 03/23/2015   Procedure: RIGHT EXAM UNDER ANESTHESIA WITH MANIPULATION UNDER ANESTHESIA;  Surgeon:  Jene Every, MD;  Location: [facility];  Service: Orthopedics;  Laterality: Right;   KNEE ARTHROSCOPY WITH PATELLAR TENDON REPAIR Right 03/23/2015   Procedure: RIGHT KNEE ARTHROSCOPY WITH DEBRIDEMENT;  Surgeon: Jene Every, MD;  Location: [facility];  Service: Orthopedics;  Laterality: Right;   LYSIS OF ADHESION Right 03/23/2015   Procedure: LYSIS OF SCAR TISSUE;  Surgeon: Jene Every, MD;  Location: [facility];  Service: Orthopedics;  Laterality: Right;   ORTHOPEDIC SURGERY     left knee quad tendon tear and patella tear    QUADRICEPS TENDON REPAIR Right 10/18/2014   Procedure: RIGHT QUADRICEP TENDON REPAIR ;  Surgeon: Javier Docker, MD;  Location: [facility];  Service: Orthopedics;  Laterality: Right;   ROTATOR CUFF REPAIR Right     Family History  Problem Relation Age of Onset   Cancer Mother        ovarian and abdomen   Diabetes Mother    Hypertension Mother    Arthritis Mother    Heart disease Father    Hypertension Father    Cancer Father        kidney and bladder    Arthritis Father    Hearing loss Father    Kidney disease Father    Arthritis Sister    Stroke Maternal Grandmother    Stroke Paternal Grandmother    Arthritis Sister    Lupus Sister      Current Outpatient Medications:    atorvastatin (LIPITOR) 20 MG tablet, Take 1 tablet (20 mg total) by mouth daily., Disp: 90  tablet, Rfl: 3   Continuous Blood Gluc Sensor (DEXCOM G6 SENSOR) MISC, Change sensor q 10 days., Disp: 3 each, Rfl: 1   Continuous Blood Gluc Transmit (DEXCOM G6 TRANSMITTER) MISC, 1 Device by Does not apply route every 3 (three) months., Disp: 1 each, Rfl: 3   fexofenadine-pseudoephedrine (ALLEGRA-D 24) 180-240 MG 24 hr tablet, Take 1 tablet by mouth daily., Disp: 30 tablet, Rfl: 11   mometasone (NASONEX) 50 MCG/ACT nasal spray, Place 2 sprays into the nose daily., Disp: 1 each, Rfl: 3   montelukast (SINGULAIR) 10 MG tablet, Take 1 tablet (10 mg total) by mouth at bedtime., Disp: 30 tablet, Rfl: 4    pantoprazole (PROTONIX) 40 MG tablet, Take 1 tablet by mouth once daily, Disp: 90 tablet, Rfl: 2   SYNJARDY XR 25-1000 MG TB24, Take 1 tablet by mouth once daily with breakfast, Disp: 30 tablet, Rfl: 3  EXAM:  VITALS per patient if applicable: RR between 12-20 bpm  GENERAL: alert, oriented, appears well and in no acute distress  HEENT: atraumatic, conjunctiva clear, no obvious abnormalities on inspection of external nose and ears  NECK: normal movements of the head and neck  LUNGS: Rare cough during visit.  Able to carry on a conversation without stopping.  On inspection no signs of respiratory distress, breathing rate appears normal, no obvious gross SOB, gasping or wheezing  CV: no obvious cyanosis  MS: moves all visible extremities without noticeable abnormality  PSYCH/NEURO: pleasant and cooperative, no obvious depression or anxiety, speech and thought processing grossly intact  ASSESSMENT AND PLAN:  Discussed the following assessment and plan:  Viral URI with cough  Conflicting information is patient sent MyChart message stating COVID positive requesting antiviral medication, however on call but this provider repeatedly stated testing was negative on 1/28 and today 1/30.  If patient was COVID-positive outside of window for antiviral medication as symptoms started on Wednesday, 09/18/2021.  Supportive care advised including OTC cough/cold medications, rest, hydration, OTC antihistamines.  Patient concerned versus pneumonia.  Advised to try recommended treatments, for continued or worsened symptoms discussed CXR.  Would repeat COVID testing if pt wishes to have CXR in clinic.  Given strict precautions.  Follow-up with PCP as needed   I discussed the assessment and treatment plan with the patient. The patient was provided an opportunity to ask questions and all were answered. The patient agreed with the plan and demonstrated an understanding of the instructions.   The patient was  advised to call back or seek an in-person evaluation if the symptoms worsen or if the condition fails to improve as anticipated.  Deeann Saint, MD

## 2021-09-30 ENCOUNTER — Encounter: Payer: Self-pay | Admitting: Family Medicine

## 2021-09-30 ENCOUNTER — Ambulatory Visit: Payer: 59 | Admitting: Family Medicine

## 2021-09-30 VITALS — BP 128/80 | HR 96 | Resp 16 | Ht 75.5 in | Wt 267.2 lb

## 2021-09-30 DIAGNOSIS — N529 Male erectile dysfunction, unspecified: Secondary | ICD-10-CM

## 2021-09-30 DIAGNOSIS — R7989 Other specified abnormal findings of blood chemistry: Secondary | ICD-10-CM

## 2021-09-30 MED ORDER — TESTOSTERONE 20.25 MG/ACT (1.62%) TD GEL: 4050.0000 mg | Freq: Every day | TRANSDERMAL | 0 refills | Status: DC

## 2021-09-30 NOTE — Progress Notes (Signed)
Chief Complaint  Patient presents with   Medication Management    Discuss testosterone replacement options  HPI: [name] is a 51 y.o. male, who is here today to follow on recent visit and discussed recent lab results. He was last seen on 09/06/21, when he was c/o mild ED, intermittent, and decreased morning erections. Negative for decreased libido and depression. Occasional fatigue. No external genital deformities   Lab Results  Component Value Date   TESTOSTERONE 98 (L) 09/18/2021   09/06/21 total testosterone 185 on 09/06/21.  He was also expressing concerns about prostate cancer. Denies dysuria,increased urinary frequency, gross hematuria,or decreased urine output.  Lab Results  Component Value Date   PSA 0.59 09/06/2021   PSA 0.43 07/06/2020   PSA 0.48 03/28/2019   Review of Systems  Constitutional:  Negative for chills and fever.  Respiratory:  Negative for cough and shortness of breath.   Cardiovascular:  Negative for chest pain, palpitations and leg swelling.  Gastrointestinal:  Negative for abdominal pain, diarrhea and vomiting.  Musculoskeletal:  Negative for gait problem and myalgias.  Neurological:  Negative for syncope and weakness.  Hematological:  Negative for adenopathy. Does not bruise/bleed easily.  Rest see pertinent positives and negatives per HPI.  Current Outpatient Medications on File Prior to Visit  Medication Sig Dispense Refill   atorvastatin (LIPITOR) 20 MG tablet Take 1 tablet (20 mg total) by mouth daily. 90 tablet 3   Continuous Blood Gluc Sensor (DEXCOM G6 SENSOR) MISC Change sensor q 10 days. 3 each 1   Continuous Blood Gluc Transmit (DEXCOM G6 TRANSMITTER) MISC 1 Device by Does not apply route every 3 (three) months. 1 each 3   fexofenadine-pseudoephedrine (ALLEGRA-D 24) 180-240 MG 24 hr tablet Take 1 tablet by mouth daily. 30 tablet 11   mometasone (NASONEX) 50 MCG/ACT nasal spray Place 2 sprays into the nose daily. 1 each  3   montelukast (SINGULAIR) 10 MG tablet Take 1 tablet (10 mg total) by mouth at bedtime. 30 tablet 4   pantoprazole (PROTONIX) 40 MG tablet Take 1 tablet by mouth once daily 90 tablet 2   SYNJARDY XR 25-1000 MG TB24 Take 1 tablet by mouth once daily with breakfast 30 tablet 3   No current facility-administered medications on file prior to visit.    Past Medical History:  Diagnosis Date   Allergy    Arthritis    Complication of anesthesia    years ago used to hyperventilate no recent problems    GERD (gastroesophageal reflux disease)    History of chicken pox    Allergies  Allergen Reactions   Oxycodone Shortness Of Breath and Other (See Comments)    Hallucinations   Vicodin [Hydrocodone-Acetaminophen] Shortness Of Breath and Other (See Comments)    Hallucinations    Social History   Socioeconomic History   Marital status: Married    Spouse name: Not on file   Number of children: Not on file   Years of education: Not on file   Highest education level: Not on file  Occupational History   Not on file  Tobacco Use   Smoking status: Former    Packs/day: 0.75    Years: 20.00    Pack years: 15.00    Types: Cigarettes, Cigars    Quit date: 08/25/2012    Years since quitting: 9.1   Smokeless tobacco: Former    Types: Snuff    Quit date: 08/26/2015  Vaping Use   Vaping Use: Every day  Start date: 04/04/2018  Substance and Sexual Activity   Alcohol use: Yes    Alcohol/week: 3.0 standard drinks    Types: 3 Cans of beer per week    Comment: Occasionally   Drug use: No   Sexual activity: Yes    Birth control/protection: Condom  Other Topics Concern   Not on file  Social History Narrative   Not on file   Social Determinants of Health   Financial Resource Strain: Unknown   Difficulty of Paying Living Expenses: Patient refused  Food Insecurity: Unknown   Worried About Charity fundraiser in the Last Year: Patient refused   Arboriculturist in the Last Year: Patient  refused  Transportation Needs: Unknown   Film/video editor (Medical): Patient refused   Lack of Transportation (Non-Medical): Patient refused  Physical Activity: Unknown   Days of Exercise per Week: 0 days   Minutes of Exercise per Session: Not on file  Stress: Not on file  Social Connections: Unknown   Frequency of Communication with Friends and Family: Patient refused   Frequency of Social Gatherings with Friends and Family: Patient refused   Attends Religious Services: Patient refused   Active Member of Clubs or Organizations: Patient refused   Attends Archivist Meetings: Not on file   Marital Status: Patient refused    Vitals:   09/30/21 1459  BP: 128/80  Pulse: 96  Resp: 16  SpO2: 98%   Body mass index is 32.96 kg/m.  Physical Exam Vitals and nursing note reviewed.  Constitutional:      General: He is not in acute distress.    Appearance: He is well-developed.  HENT:     Head: Normocephalic and atraumatic.  Eyes:     Conjunctiva/sclera: Conjunctivae normal.  Cardiovascular:     Rate and Rhythm: Normal rate and regular rhythm.     Heart sounds: No murmur heard. Pulmonary:     Effort: Pulmonary effort is normal. No respiratory distress.     Breath sounds: Normal breath sounds.  Skin:    General: Skin is warm.     Findings: No erythema or rash.  Neurological:     Mental Status: He is alert and oriented to person, place, and time.     Cranial Nerves: No cranial nerve deficit.     Gait: Gait normal.  Psychiatric:     Comments: Well groomed, good eye contact.   ASSESSMENT AND PLAN:  [id number] was seen today for medication management.  Diagnoses and all orders for this visit:  Low testosterone in male We discussed general recommendations in regard to treatment of hypogonadism and goals of treatment. We reviewed side effects of testosterone, including increasing CV risk and prostate cancer. Injectable vs gel, because he is concerned about  injection fee cost, he agrees with starting gel, 40.50 mg today, 1 pump on each arm.  Instructed about warning signs.  -     Testosterone 20.25 MG/ACT (1.62%) GEL; Place 40.5 mg onto the skin daily before breakfast. 1 pump (20.25 mg) on each arm.  Erectile dysfunction, unspecified erectile dysfunction type Could be related to low testosterone. If not improved with testosterone replacement we need to re-evaluate treatment.  Return in about 3 months (around 12/28/2021) for Please move appt from 01/2022.  Hudson Lehmkuhl G. [place], MD  [facility]. [place] office.

## 2021-09-30 NOTE — Patient Instructions (Signed)
A few things to remember from today's visit:   Low testosterone in male - Plan: Testosterone 20.25 MG/ACT (1.62%) GEL  Erectile dysfunction, unspecified erectile dysfunction type  If you need refills please call your pharmacy. Do not use My Chart to request refills or for acute issues that need immediate attention.   Apply one pump on each arm or other skin surface. We will re-check testosterone in 3 months.  Please be sure medication list is accurate. If a new problem present, please set up appointment sooner than planned today. Testosterone Skin Gel What is this medication? TESTOSTERONE (tes TOS ter one) is used to increase testosterone levels in your body. It belongs to a group of medications called androgen hormones. This medicine may be used for other purposes; ask your health care provider or pharmacist if you have questions. COMMON BRAND NAME(S): AndroGel, FORTESTA, Testim, Vogelxo What should I tell my care team before I take this medication? They need to know if you have any of these conditions: Breast cancer Diabetes Heart disease Heart failure Kidney disease Liver disease Lung or breathing disease (asthma, COPD) Polycythemia Prostate cancer or disease Sleep apnea An unusual or allergic reaction to testosterone, other medications, foods, dyes, or preservatives If you or your partner are pregnant or trying to get pregnant Breast-feeding How should I use this medication? This medication is for external use only. Do not take by mouth. Wash your hands before and after use. Use it as directed on the prescription label, at the same time every day. Do not use it more often than directed. Make sure that you are using your product correctly. Allow the skin to air-dry, then cover with clothing to prevent others from coming in contact with the medication on your skin. This medication comes with INSTRUCTIONS FOR USE. Ask your pharmacist for directions on how to use this medication.  Read the information carefully. Talk to your pharmacist or care team if you have questions. A special MedGuide will be given to you by the pharmacist with each prescription and refill. Be sure to read this information carefully each time. Talk to your care team regarding the use of this medication in children. Special care may be needed. Overdosage: If you think you have taken too much of this medicine contact a poison control center or emergency room at once. NOTE: This medicine is only for you. Do not share this medicine with others. What if I miss a dose? If you miss a dose, use it as soon as you can. If it is almost time for your next dose, use only that dose. Do not use double or extra doses. What may interact with this medication? Medications for diabetes Medications that treat or prevent blood clots like warfarin Steroid medications like prednisone or cortisone This list may not describe all possible interactions. Give your health care provider a list of all the medicines, herbs, non-prescription drugs, or dietary supplements you use. Also tell them if you smoke, drink alcohol, or use illegal drugs. Some items may interact with your medicine. What should I watch for while using this medication? Visit your care team for regular checks on your progress. Tell your care team if your symptoms do not start to get better or if they get worse. You may need blood work while you are taking this medication. Heart attacks and strokes have been reported with the use of this medication. Call emergency services if you develop signs or symptoms of a heart attack or stroke. Talk to  your care team about the risks and benefits of this medication. This medication can transfer from your body to others. If a person or pet comes in contact with the medication, they may have a serious risk of side effects. If you cannot avoid skin-to-skin contact, cover the medication site with clothing. If accidental contact happens,  wash the skin of the person or pet right away with soap and water. A partner who is pregnant or trying to get pregnant should avoid contact with the medication and treated skin. Talk to your care team if you wish to become pregnant or think you might be pregnant. This medication can cause serious birth defects. This medication may affect blood sugar. Ask your care team if changes in diet or medications are needed if you have diabetes. This medication is banned from use in athletes by most athletic organizations. What side effects may I notice from receiving this medication? Side effects that you should report to your care team as soon as possible: Allergic reactions--skin rash, itching, hives, swelling of the face, lips, tongue, or throat Blood clot--pain, swelling, or warmth in the leg, shortness of breath, chest pain Heart attack--pain or tightness in the chest, shoulders, arms, or jaw, nausea, shortness of breath, cold or clammy skin, feeling faint or lightheaded Increase in blood pressure Liver injury--right upper belly pain, loss of appetite, nausea, light-colored stool, dark yellow or brown urine, yellowing of the skin or eyes, unusual weakness or fatigue Mood swings, irritability or hostility Prolonged or painful erection Sleep apnea--loud snoring, gasping during sleep, daytime sleepiness Stroke--sudden numbness or weakness of the face, arm, or leg, trouble speaking, confusion, trouble walking, loss of balance or coordination, dizziness, severe headache, change in vision Swelling of the ankles, hands, or feet Thoughts of suicide or self-harm, worsening mood, feelings of depression Side effects that usually do not require medical attention (report these to your care team if they continue or are bothersome): Acne Change in sex drive or performance Irritation at application site Unexpected breast tissue growth This list may not describe all possible side effects. Call your doctor for  medical advice about side effects. You may report side effects to FDA at 1-800-FDA-1088. Where should I keep my medication? Keep out of the reach of children and pets. This medication can be abused. Keep your medication in a safe place to protect it from theft. Do not share this medication with anyone. Selling or giving away this medication is dangerous and against the law. Store at room temperature between 20 and 25 degrees C (68 to 77 degrees F). Keep closed until use. Protect from heat and light. This medication is flammable. Avoid exposure to heat, fire, flame, and smoking. Throw away any unused medication after the expiration date. NOTE: This sheet is a summary. It may not cover all possible information. If you have questions about this medicine, talk to your doctor, pharmacist, or health care provider.  2022 Elsevier/Gold Standard (2021-04-30 00:00:00)

## 2021-10-01 ENCOUNTER — Encounter: Payer: Self-pay | Admitting: Family Medicine

## 2021-10-01 MED ORDER — TESTOSTERONE 20.25 MG/ACT (1.62%) TD GEL: 40.5000 mg | Freq: Every day | TRANSDERMAL | 0 refills | Status: DC

## 2021-10-02 ENCOUNTER — Telehealth: Payer: Self-pay

## 2021-10-02 NOTE — Telephone Encounter (Signed)
Called pharmacy for clarification. Representative states instructions were fixed, however, a prior auth is needed for this med & was sent via CoverMyMeds.

## 2021-10-02 NOTE — Telephone Encounter (Signed)
PA started in [employer]

## 2021-10-10 ENCOUNTER — Encounter: Payer: Self-pay | Admitting: Family Medicine

## 2021-11-14 ENCOUNTER — Other Ambulatory Visit: Payer: Self-pay | Admitting: Family Medicine

## 2021-11-14 DIAGNOSIS — R7989 Other specified abnormal findings of blood chemistry: Secondary | ICD-10-CM

## 2021-11-14 MED ORDER — TESTOSTERONE 20.25 MG/ACT (1.62%) TD GEL: 40.5000 mg | Freq: Every day | TRANSDERMAL | 0 refills | Status: DC

## 2021-11-14 NOTE — Telephone Encounter (Signed)
Approvedon February 15 ?CaseId:[id number];Status:Approved;Review Type:Prior Auth;Coverage Start Date:09/08/2021;Coverage End Date:10/09/2022 ?

## 2021-12-23 ENCOUNTER — Other Ambulatory Visit: Payer: Self-pay | Admitting: Family Medicine

## 2021-12-23 DIAGNOSIS — E1169 Type 2 diabetes mellitus with other specified complication: Secondary | ICD-10-CM

## 2021-12-25 ENCOUNTER — Other Ambulatory Visit: Payer: Self-pay | Admitting: Family Medicine

## 2021-12-25 DIAGNOSIS — E1169 Type 2 diabetes mellitus with other specified complication: Secondary | ICD-10-CM

## 2021-12-27 NOTE — Progress Notes (Signed)
? ?HPI: ?[name] is a 51 y.o. male, who is here today for follow up. ? ?No new problems since his last visit. ?Last seen on 09/30/21. ?Diabetes Mellitus II: Dx'ed in 03/2020. ?- Checking BG at home: Does not check it frequently, does not provide numbers. ?- Medications: Ozempic 0.5 mg weekly, has not had it in 2 weeks. 1 mg weekly caused nausea, resolved after decreasing dose. He is also on Syndardy XR 25-1000 mg daily, which he is forgetting to take a couple times per week. ?- Diet: Trying to do better. He feels like he has lost some wt, clothes fit looser. ?- Exercise: Not regularly. ?- eye exam: Over a year ago. ?- foot exam: 08/2021. ?- Negative for symptoms of hypoglycemia, polyuria, polydipsia, numbness extremities, foot ulcers/trauma ? ?Lab Results  ?Component Value Date  ? HGBA1C 6.9 09/06/2021  ? ?Lab Results  ?Component Value Date  ? MICROALBUR <0.7 09/06/2021  ? ?Last visit, he was concerned about ED. ?Testosterone was low, started on testosterone gel, which he applied for about a month. He did not noted major difference in ED or fatigue. He is not interested in continuing medication. ?Lab Results  ?Component Value Date  ? TESTOSTERONE 98 (L) 09/18/2021  ? ?Review of Systems  ?Constitutional:  Negative for activity change, appetite change and fever.  ?HENT:  Negative for mouth sores and nosebleeds.   ?Eyes:  Negative for redness and visual disturbance.  ?Respiratory:  Negative for cough, shortness of breath and wheezing.   ?Cardiovascular:  Negative for chest pain, palpitations and leg swelling.  ?Gastrointestinal:  Negative for abdominal pain, nausea and vomiting.  ?Genitourinary:  Negative for decreased urine volume, dysuria and hematuria.  ?Neurological:  Negative for syncope, weakness and headaches.  ?Rest of ROS see pertinent positives and negatives in HPI. ? ?Current Outpatient Medications on File Prior to Visit  ?Medication Sig Dispense Refill  ? atorvastatin (LIPITOR) 20 MG tablet  Take 1 tablet (20 mg total) by mouth daily. 90 tablet 3  ? Continuous Blood Gluc Sensor (DEXCOM G6 SENSOR) MISC Change sensor q 10 days. 3 each 1  ? Continuous Blood Gluc Transmit (DEXCOM G6 TRANSMITTER) MISC 1 Device by Does not apply route every 3 (three) months. 1 each 3  ? fexofenadine-pseudoephedrine (ALLEGRA-D 24) 180-240 MG 24 hr tablet Take 1 tablet by mouth daily. 30 tablet 11  ? mometasone (NASONEX) 50 MCG/ACT nasal spray Place 2 sprays into the nose daily. 1 each 3  ? montelukast (SINGULAIR) 10 MG tablet Take 1 tablet (10 mg total) by mouth at bedtime. 30 tablet 4  ? pantoprazole (PROTONIX) 40 MG tablet Take 1 tablet by mouth once daily 90 tablet 2  ? ?No current facility-administered medications on file prior to visit.  ? ?Past Medical History:  ?Diagnosis Date  ? Allergy   ? Arthritis   ? Complication of anesthesia   ? years ago used to hyperventilate no recent problems   ? GERD (gastroesophageal reflux disease)   ? History of chicken pox   ? ?Allergies  ?Allergen Reactions  ? Oxycodone Shortness Of Breath and Other (See Comments)  ?  Hallucinations  ? Vicodin [Hydrocodone-Acetaminophen] Shortness Of Breath and Other (See Comments)  ?  Hallucinations  ? ? ?Social History  ? ?Socioeconomic History  ? Marital status: Married  ?  Spouse name: Not on file  ? Number of children: Not on file  ? Years of education: Not on file  ? Highest education level: Not on  file  ?Occupational History  ? Not on file  ?Tobacco Use  ? Smoking status: Former  ?  Packs/day: 0.75  ?  Years: 20.00  ?  Pack years: 15.00  ?  Types: Cigarettes, Cigars  ?  Quit date: 08/25/2012  ?  Years since quitting: 9.3  ? Smokeless tobacco: Former  ?  Types: Snuff  ?  Quit date: 08/26/2015  ?Vaping Use  ? Vaping Use: Every day  ? Start date: 04/04/2018  ?Substance and Sexual Activity  ? Alcohol use: Yes  ?  Alcohol/week: 3.0 standard drinks  ?  Types: 3 Cans of beer per week  ?  Comment: Occasionally  ? Drug use: No  ? Sexual activity: Yes  ?  Birth  control/protection: Condom  ?Other Topics Concern  ? Not on file  ?Social History Narrative  ? Not on file  ? ?Social Determinants of Health  ? ?Financial Resource Strain: Unknown  ? Difficulty of Paying Living Expenses: Patient refused  ?Food Insecurity: Unknown  ? Worried About Programme researcher, broadcasting/film/videounning Out of Food in the Last Year: Patient refused  ? Ran Out of Food in the Last Year: Patient refused  ?Transportation Needs: Unknown  ? Lack of Transportation (Medical): Patient refused  ? Lack of Transportation (Non-Medical): Patient refused  ?Physical Activity: Unknown  ? Days of Exercise per Week: 0 days  ? Minutes of Exercise per Session: Not on file  ?Stress: Not on file  ?Social Connections: Unknown  ? Frequency of Communication with Friends and Family: Patient refused  ? Frequency of Social Gatherings with Friends and Family: Patient refused  ? Attends Religious Services: Patient refused  ? Active Member of Clubs or Organizations: Patient refused  ? Attends BankerClub or Organization Meetings: Not on file  ? Marital Status: Patient refused  ? ?Vitals:  ? 12/30/21 1549  ?BP: 120/70  ?Pulse: 90  ?Resp: 16  ?SpO2: 98%  ? ?Wt Readings from Last 3 Encounters:  ?12/30/21 268 lb (121.6 kg)  ?09/30/21 267 lb 4 oz (121.2 kg)  ?09/06/21 267 lb 8 oz (121.3 kg)  ? ?Body mass index is 33.06 kg/m?. ? ?Physical Exam ?Vitals and nursing note reviewed.  ?Constitutional:   ?   General: He is not in acute distress. ?   Appearance: He is well-developed.  ?HENT:  ?   Head: Normocephalic and atraumatic.  ?Eyes:  ?   Conjunctiva/sclera: Conjunctivae normal.  ?Cardiovascular:  ?   Rate and Rhythm: Normal rate and regular rhythm.  ?   Heart sounds: No murmur heard. ?Pulmonary:  ?   Effort: Pulmonary effort is normal. No respiratory distress.  ?   Breath sounds: Normal breath sounds.  ?Abdominal:  ?   Palpations: Abdomen is soft. There is no hepatomegaly or mass.  ?   Tenderness: There is no abdominal tenderness.  ?Lymphadenopathy:  ?   Cervical: No cervical  adenopathy.  ?Skin: ?   General: Skin is warm.  ?   Findings: No erythema or rash.  ?Neurological:  ?   Mental Status: He is alert and oriented to person, place, and time.  ?   Cranial Nerves: No cranial nerve deficit.  ?   Gait: Gait normal.  ?Psychiatric:  ?   Comments: Well groomed, good eye contact.  ? ?ASSESSMENT AND PLAN: ? ?[name] was seen today for follow-up. ? ?Diagnoses and all orders for this visit: ?Orders Placed This Encounter  ?Procedures  ? POC HgB A1c  ? ?Lab Results  ?Component Value Date  ?  HGBA1C 7.0 12/30/2021  ? ?Type 2 diabetes mellitus with other specified complication (HCC) ?Problem has not been well controlled. ?HgA1C went from 6.9 to 7.0. ?He has not been taking medications daily, stressed the importance of compliance. ?Continue Ozempic 0.5 mg weekly and Syndardy XR 25-1000 mg daily. ?Annual eye exam, periodic dental and foot care recommended. Overdue for eye exam. ?F/U in 5 months ? ?Class 1 obesity with body mass index (BMI) of 33.0 to 33.9 in adult ?We discussed benefits of wt loss, he has noted clothes fitting better. ?Consistency with healthy diet and physical activity encouraged. ?Ozempic 0.5 mg weekly to be resumed. ? ? ?Low testosterone in male ?He did not continue Testosterone replacement, took medication for 4 weeks and did not notice major improvement in symptoms (ED and fatigue). He is not interested in resuming medication. ? ?Return in about 5 months (around 06/01/2022). ? ?Emrie Gayle G. [place], MD ? ?[facility]. ?[place] office. ? ?

## 2021-12-28 ENCOUNTER — Other Ambulatory Visit: Payer: Self-pay | Admitting: Family Medicine

## 2021-12-28 DIAGNOSIS — E1169 Type 2 diabetes mellitus with other specified complication: Secondary | ICD-10-CM

## 2021-12-30 ENCOUNTER — Ambulatory Visit: Payer: 59 | Admitting: Family Medicine

## 2021-12-30 ENCOUNTER — Encounter: Payer: Self-pay | Admitting: Family Medicine

## 2021-12-30 VITALS — BP 120/70 | HR 90 | Resp 16 | Ht 75.5 in | Wt 268.0 lb

## 2021-12-30 DIAGNOSIS — E6609 Other obesity due to excess calories: Secondary | ICD-10-CM | POA: Diagnosis not present

## 2021-12-30 DIAGNOSIS — R7989 Other specified abnormal findings of blood chemistry: Secondary | ICD-10-CM | POA: Diagnosis not present

## 2021-12-30 DIAGNOSIS — Z6833 Body mass index (BMI) 33.0-33.9, adult: Secondary | ICD-10-CM

## 2021-12-30 DIAGNOSIS — E1169 Type 2 diabetes mellitus with other specified complication: Secondary | ICD-10-CM | POA: Diagnosis not present

## 2021-12-30 LAB — POCT GLYCOSYLATED HEMOGLOBIN (HGB A1C): HbA1c, POC (controlled diabetic range): 7 % (ref 0.0–7.0)

## 2021-12-30 MED ORDER — SYNJARDY XR 25-1000 MG PO TB24: 1.0000 | ORAL_TABLET | Freq: Every day | ORAL | 2 refills | Status: DC

## 2021-12-30 MED ORDER — SEMAGLUTIDE (1 MG/DOSE) 4 MG/3ML ~~LOC~~ SOPN
0.5000 mg | PEN_INJECTOR | SUBCUTANEOUS | 3 refills | Status: DC
Start: 2021-12-30 — End: 2021-12-31

## 2021-12-30 NOTE — Assessment & Plan Note (Addendum)
Problem has not been well controlled. ?HgA1C went from 6.9 to 7.0. ?He has not been taking medications daily, stressed the importance of compliance. ?Continue Ozempic 0.5 mg weekly and Syndardy XR 25-1000 mg daily. ?Annual eye exam, periodic dental and foot care recommended. Overdue for eye exam. ?F/U in 5 months ?

## 2021-12-30 NOTE — Assessment & Plan Note (Signed)
We discussed benefits of wt loss, he has noted clothes fitting better. ?Consistency with healthy diet and physical activity encouraged. ?Ozempic 0.5 mg weekly to be resumed. ? ?

## 2021-12-30 NOTE — Patient Instructions (Addendum)
A few things to remember from today's visit: ? ? ?Type 2 diabetes mellitus with other specified complication, without long-term current use of insulin (HCC) - Plan: POC HgB A1c, Semaglutide, 1 MG/DOSE, 4 MG/3ML SOPN ? ?Low testosterone in male ? ?If you need refills please call your pharmacy. ?Do not use My Chart to request refills or for acute issues that need immediate attention. ?  ?Continue Ozempic 0.5 mg weekly. ?Try to be consistent with taking medications daily. ?We stopped testosterone. ?Please call to reschedule colonoscopy. ? ?Please be sure medication list is accurate. ?If a new problem present, please set up appointment sooner than planned today. ? ?[facility] ?Address: [address] [place], [place], [place] [address] ?Phone: [phone]  ? ? ? ? ? ?

## 2021-12-30 NOTE — Telephone Encounter (Signed)
It looks like this was discontinued back in August, he has a f/u appointment today. ?

## 2021-12-30 NOTE — Assessment & Plan Note (Signed)
He did not continue Testosterone replacement, took medication for 4 weeks and did not notice major improvement in symptoms (ED and fatigue). He is not interested in resuming medication. ?

## 2021-12-31 MED ORDER — OZEMPIC (0.25 OR 0.5 MG/DOSE) 2 MG/1.5ML ~~LOC~~ SOPN: 0.5000 mg | PEN_INJECTOR | SUBCUTANEOUS | 3 refills | Status: DC

## 2021-12-31 NOTE — Addendum Note (Signed)
Addended by: Rodrigo Ran on: 12/31/2021 02:37 PM ? ? Modules accepted: Orders ? ?

## 2022-02-07 ENCOUNTER — Ambulatory Visit: Payer: 59 | Admitting: Family Medicine

## 2022-02-12 ENCOUNTER — Other Ambulatory Visit: Payer: Self-pay | Admitting: Family Medicine

## 2022-02-12 DIAGNOSIS — E1169 Type 2 diabetes mellitus with other specified complication: Secondary | ICD-10-CM

## 2022-05-29 ENCOUNTER — Other Ambulatory Visit: Payer: Self-pay | Admitting: Family Medicine

## 2022-05-29 DIAGNOSIS — K219 Gastro-esophageal reflux disease without esophagitis: Secondary | ICD-10-CM

## 2022-05-30 NOTE — Progress Notes (Signed)
HPI: [name] is a 51 y.o. male, who is here today for follow-up.  Last seen on 12/30/21  Diabetes Mellitus II: Dx'ed in 03/2020. - Checking BG at home: Not checking. He would like a Rx fir Dexcom G7.  -Currently he is on Ozempic 0.5 mg weekly and empagliflozin-metformin ER 25-at 1000 mg daily. Did not take medication for a month due to cost, resumed it 2-3 weeks ago. Higher doses of Ozempic caused GI symptoms. - Diet: He has not been consistent for the past 2 to 3 months. - Exercise: None. - eye exam: > a year ago. - foot exam: 08/2021 - Negative for symptoms of hypoglycemia, polyuria, polydipsia, foot ulcers/trauma  Today he is complaining of 3 to 4 months of numbness of feet, feels sometimes like bottom of feet are swollen. It seems to be aggravated by wearing his work boots, improves during days off. He has not noted local edema or erythema. He has been wearing the same type of shoes for years. Lab Results  Component Value Date   HGBA1C 7.0 12/30/2021   Lab Results  Component Value Date   MICROALBUR <0.7 09/06/2021   He has been referred to GI for colon cancer screening, has not had colonoscopy done.  C/O throat discomfort, he is not his typical sore throat from allergies. He is concerned about possibility of cancer. Former smoker. He also stopped vaping. Today he is reporting years history of difficulty swallowing solids, usually bread and not all the time. He denies abnormal weight loss, melena, changes in bowel habits, or blood in the stool. GERD on Protonix 40 mg daily, which has helped with heartburn.  Review of Systems  Constitutional:  Negative for activity change, appetite change and fever.  HENT:  Positive for congestion, postnasal drip and rhinorrhea. Negative for mouth sores.   Eyes:  Negative for redness and visual disturbance.  Respiratory:  Negative for cough, shortness of breath and wheezing.   Cardiovascular:  Negative for chest pain,  palpitations and leg swelling.  Gastrointestinal:  Negative for abdominal pain, nausea and vomiting.  Genitourinary:  Negative for decreased urine volume, dysuria and hematuria.  Skin:  Negative for rash and wound.  Neurological:  Negative for syncope, weakness and headaches.  Psychiatric/Behavioral:  Negative for confusion. The patient is nervous/anxious.   Rest of ROS: See pertinent positives and negatives in HPI.  Current Outpatient Medications on File Prior to Visit  Medication Sig Dispense Refill   atorvastatin (LIPITOR) 20 MG tablet Take 1 tablet (20 mg total) by mouth daily. 90 tablet 3   Continuous Blood Gluc Sensor (DEXCOM G6 SENSOR) MISC Change sensor q 10 days. 3 each 1   Continuous Blood Gluc Transmit (DEXCOM G6 TRANSMITTER) MISC 1 Device by Does not apply route every 3 (three) months. 1 each 3   Empagliflozin-metFORMIN HCl ER (SYNJARDY XR) 25-1000 MG TB24 Take 1 tablet by mouth daily with breakfast. 90 tablet 2   fexofenadine-pseudoephedrine (ALLEGRA-D 24) 180-240 MG 24 hr tablet Take 1 tablet by mouth daily. 30 tablet 11   mometasone (NASONEX) 50 MCG/ACT nasal spray Place 2 sprays into the nose daily. 1 each 3   montelukast (SINGULAIR) 10 MG tablet Take 1 tablet (10 mg total) by mouth at bedtime. 30 tablet 4   pantoprazole (PROTONIX) 40 MG tablet Take 1 tablet by mouth once daily 30 tablet 0   Semaglutide,0.25 or 0.5MG /DOS, (OZEMPIC, 0.25 OR 0.5 MG/DOSE,) 2 MG/1.5ML SOPN Inject 0.5 mg into the skin once a week.  3 mL 3   No current facility-administered medications on file prior to visit.   Past Medical History:  Diagnosis Date   Allergy    Arthritis    Complication of anesthesia    years ago used to hyperventilate no recent problems    GERD (gastroesophageal reflux disease)    History of chicken pox    Allergies  Allergen Reactions   Oxycodone Shortness Of Breath and Other (See Comments)    Hallucinations   Vicodin [Hydrocodone-Acetaminophen] Shortness Of Breath and  Other (See Comments)    Hallucinations   Social History   Socioeconomic History   Marital status: Married    Spouse name: Not on file   Number of children: Not on file   Years of education: Not on file   Highest education level: Not on file  Occupational History   Not on file  Tobacco Use   Smoking status: Former    Packs/day: 0.75    Years: 20.00    Total pack years: 15.00    Types: Cigarettes, Cigars    Quit date: 08/25/2012    Years since quitting: 9.7   Smokeless tobacco: Former    Types: Snuff    Quit date: 08/26/2015  Vaping Use   Vaping Use: Every day   Start date: 04/04/2018  Substance and Sexual Activity   Alcohol use: Yes    Alcohol/week: 3.0 standard drinks of alcohol    Types: 3 Cans of beer per week    Comment: Occasionally   Drug use: No   Sexual activity: Yes    Birth control/protection: Condom  Other Topics Concern   Not on file  Social History Narrative   Not on file   Social Determinants of Health   Financial Resource Strain: Unknown (09/30/2021)   Overall Financial Resource Strain (CARDIA)    Difficulty of Paying Living Expenses: Patient refused  Food Insecurity: Unknown (09/30/2021)   Hunger Vital Sign    Worried About Running Out of Food in the Last Year: Patient refused    Ran Out of Food in the Last Year: Patient refused  Transportation Needs: Unknown (09/30/2021)   PRAPARE - Transportation    Lack of Transportation (Medical): Patient refused    Lack of Transportation (Non-Medical): Patient refused  Physical Activity: Unknown (09/30/2021)   Exercise Vital Sign    Days of Exercise per Week: 0 days    Minutes of Exercise per Session: Not on file  Stress: Not on file  Social Connections: Unknown (09/30/2021)   Social Connection and Isolation Panel [NHANES]    Frequency of Communication with Friends and Family: Patient refused    Frequency of Social Gatherings with Friends and Family: Patient refused    Attends Religious Services: Patient refused     Active Member of Clubs or Organizations: Patient refused    Attends Banker Meetings: Not on file    Marital Status: Patient refused   Vitals:   06/02/22 1513  BP: 138/78  Pulse: 88  Resp: 12  Temp: 98.1 F (36.7 C)  SpO2: 94%   Body mass index is 32.52 kg/m. Physical Exam Vitals and nursing note reviewed.  Constitutional:      General: He is not in acute distress.    Appearance: He is well-developed.  HENT:     Head: Normocephalic and atraumatic.  Eyes:     Conjunctiva/sclera: Conjunctivae normal.  Cardiovascular:     Rate and Rhythm: Normal rate and regular rhythm.     Pulses:  Dorsalis pedis pulses are 2+ on the right side and 2+ on the left side.     Heart sounds: No murmur heard. Pulmonary:     Effort: Pulmonary effort is normal. No respiratory distress.     Breath sounds: Normal breath sounds.  Abdominal:     Palpations: Abdomen is soft. There is no hepatomegaly or mass.     Tenderness: There is no abdominal tenderness.  Lymphadenopathy:     Cervical: No cervical adenopathy.  Skin:    General: Skin is warm.     Findings: No erythema or rash.  Neurological:     Mental Status: He is alert and oriented to person, place, and time.     Cranial Nerves: No cranial nerve deficit.     Gait: Gait normal.    Diabetic Foot Exam - Simple   Simple Foot Form Diabetic Foot exam was performed with the following findings: Yes 06/02/2022  3:49 PM  Visual Inspection See comments: Yes Sensation Testing Intact to touch and monofilament testing bilaterally: Yes Pulse Check Posterior Tibialis and Dorsalis pulse intact bilaterally: Yes Comments Plantar calluses bilateral. Flat feet.   ASSESSMENT AND PLAN:  KD.[email or web address] was seen today for follow-up.  Diagnoses and all orders for this visit: Orders Placed This Encounter  Procedures   Flu Vaccine QUAD 16mo+IM (Fluarix, Fluzone & Alfiuria Quad PF)   Ambulatory referral to Gastroenterology   POC HgB A1c    Lab Results  Component Value Date   HGBA1C 8.8 (A) 06/02/2022   Numbness of feet ?  Diabetic peripheral neuropathy. Other possible causes discussed. TSH in 08/2021 was normal at 1.5. Continue appropriate foot care. Monitor for new symptoms. We did call the importance of elevated glucose control. If persistent, next visit we can consider B12 and CBC.  Dysphagia, unspecified type Chronic and stable. We discussed possible etiologies. History does not suggest a serious process. GI referral placed, he may need EGD. Stressed the importance of keeping appointment.  Type 2 diabetes mellitus with other specified complication ([place]) Problem is not well controlled, HgA1C went from 7.0 to 8.8. We discussed possible complications of elevated glucose. He should resume his medication about 2 to 3 weeks ago, so for now continue Ozempic0.5 mg weekly and empagliflozin-metformin ER 25-at 1000 mg daily. Regular exercise and healthy diet with avoidance of added sugar food intake is an important part of treatment and recommended. Annual eye exam, periodic dental and foot care recommended. He needs to arrange an eye exam. F/U in 3-4 months.  Gastroesophageal reflux disease Protonix 40 mg daily has helped with some GI symptoms. He is reporting years history of dysphagia, which could be caused by this problem. GI referral placed.  Need for immunization against influenza -     Flu Vaccine QUAD 86mo+IM (Fluarix, Fluzone & Alfiuria Quad PF)  Return in about 3 months (around 09/09/2022).  Arleth Mccullar G. Martinique, MD  [facility]. [place] office.

## 2022-06-02 ENCOUNTER — Encounter: Payer: Self-pay | Admitting: Family Medicine

## 2022-06-02 ENCOUNTER — Ambulatory Visit: Payer: 59 | Admitting: Family Medicine

## 2022-06-02 VITALS — BP 138/78 | HR 88 | Temp 98.1°F | Resp 12 | Ht 77.0 in | Wt 274.2 lb

## 2022-06-02 DIAGNOSIS — R1310 Dysphagia, unspecified: Secondary | ICD-10-CM

## 2022-06-02 DIAGNOSIS — K219 Gastro-esophageal reflux disease without esophagitis: Secondary | ICD-10-CM

## 2022-06-02 DIAGNOSIS — Z23 Encounter for immunization: Secondary | ICD-10-CM | POA: Diagnosis not present

## 2022-06-02 DIAGNOSIS — R200 Anesthesia of skin: Secondary | ICD-10-CM

## 2022-06-02 DIAGNOSIS — E1169 Type 2 diabetes mellitus with other specified complication: Secondary | ICD-10-CM

## 2022-06-02 LAB — POCT GLYCOSYLATED HEMOGLOBIN (HGB A1C): Hemoglobin A1C: 8.8 % — AB (ref 4.0–5.6)

## 2022-06-02 MED ORDER — DEXCOM G7 SENSOR MISC: 1 refills | Status: DC

## 2022-06-02 MED ORDER — DEXCOM G7 RECEIVER DEVI: 1.0000 | 1 refills | Status: DC

## 2022-06-02 NOTE — Patient Instructions (Addendum)
A few things to remember from today's visit:  Type 2 diabetes mellitus with other specified complication, without long-term current use of insulin ([place]) - Plan: POC HgB A1c  Need for immunization against influenza - Plan: Flu Vaccine QUAD 53mo+IM (Fluarix, Fluzone & Alfiuria Quad PF)  Numbness of feet  Dysphagia, unspecified type - Plan: Ambulatory referral to Gastroenterology  Gastroesophageal reflux disease, unspecified whether esophagitis present - Plan: Ambulatory referral to Gastroenterology  Since you have not taken medications for diabetes daily for the past 3 months, continue same meds. Numbness of feet can be caused by diabetes. Because years of difficulty swallowing, I am sending you to a gastroenterologist. Please give [facility] a call at your convenience at [phone].    If you need refills for medications you take chronically, please call your pharmacy. Do not use My Chart to request refills or for acute issues that need immediate attention. If you send a my chart message, it may take a few days to be addressed, specially if I am not in the office.  Please be sure medication list is accurate. If a new problem present, please set up appointment sooner than planned today.

## 2022-06-02 NOTE — Assessment & Plan Note (Signed)
Problem is not well controlled, HgA1C went from 7.0 to 8.8. We discussed possible complications of elevated glucose. He should resume his medication about 2 to 3 weeks ago, so for now continue Ozempic0.5 mg weekly and empagliflozin-metformin ER 25-at 1000 mg daily. Regular exercise and healthy diet with avoidance of added sugar food intake is an important part of treatment and recommended. Annual eye exam, periodic dental and foot care recommended. He needs to arrange an eye exam. F/U in 3-4 months.

## 2022-06-02 NOTE — Assessment & Plan Note (Signed)
Protonix 40 mg daily has helped with some GI symptoms. He is reporting years history of dysphagia, which could be caused by this problem. GI referral placed.

## 2022-06-12 ENCOUNTER — Encounter: Payer: Self-pay | Admitting: Gastroenterology

## 2022-06-26 ENCOUNTER — Other Ambulatory Visit: Payer: Self-pay | Admitting: Family Medicine

## 2022-06-26 DIAGNOSIS — K219 Gastro-esophageal reflux disease without esophagitis: Secondary | ICD-10-CM

## 2022-07-07 ENCOUNTER — Telehealth: Payer: Self-pay | Admitting: Family Medicine

## 2022-07-07 NOTE — Telephone Encounter (Signed)
Pt called and stated that his Rx for Semaglutide,0.25 or 0.5MG /DOS, (OZEMPIC, 0.25 OR 0.5 MG/DOSE,) 2 MG/1.5ML SOPN  Has been placed on backorder and wanted to see if he can take anything else because he is out of it and need it.   Please advise

## 2022-07-08 NOTE — Telephone Encounter (Signed)
Options are to try a different GLP-1 agonist: Trulicity, Mounjaro, or Victoza. Thanks, BJ

## 2022-07-09 NOTE — Telephone Encounter (Signed)
I left patient a voicemail letting him know what other options we have; I advised Greggory Keen will probably be a back order issue as well, so Trulicity or Victoza would be better. I advised him to call us back with which one he would like to try.

## 2022-07-11 NOTE — Telephone Encounter (Signed)
I left patient another voicemail to return my call.

## 2022-07-16 NOTE — Telephone Encounter (Signed)
I've left him a couple messages haven't gotten a return call. We'll have the same issue with [facility], so the better options would be Trulicity or Victoza.

## 2022-07-16 NOTE — Telephone Encounter (Signed)
Either once is fine, I like Trulicity because it is weekly. Trulicity 1.5 mg weekly or victoza 1.6 mg daily. Thanks, BJ

## 2022-07-16 NOTE — Telephone Encounter (Signed)
I left patient another voicemail to return my call.  

## 2022-07-21 MED ORDER — TRULICITY 1.5 MG/0.5ML ~~LOC~~ SOAJ: 1.5000 mg | SUBCUTANEOUS | 2 refills | Status: DC

## 2022-07-21 NOTE — Telephone Encounter (Signed)
I called and spoke with patient. He was able to get the Ozempic filled the next day, so he will continue on that and let us know if he has issues receiving it again.

## 2022-07-21 NOTE — Addendum Note (Signed)
Addended by: Kathreen Devoid on: 07/21/2022 09:45 AM   Modules accepted: Orders

## 2022-07-29 ENCOUNTER — Ambulatory Visit: Payer: 59 | Admitting: Gastroenterology

## 2022-08-09 ENCOUNTER — Other Ambulatory Visit: Payer: Self-pay | Admitting: Family Medicine

## 2022-08-17 ENCOUNTER — Other Ambulatory Visit: Payer: Self-pay | Admitting: Family Medicine

## 2022-09-01 NOTE — Progress Notes (Unsigned)
HPI: Brendan Macdonald. is a 52 y.o. male, who is here today for chronic disease management.  Last seen on 06/02/22  Hyperlipidemia: Currently on Atorvastatin 20 mg daily. Lab Results  Component Value Date   CHOL 127 09/18/2021   HDL 48.50 09/18/2021   LDLCALC 61 09/18/2021   TRIG 89.0 09/18/2021   CHOLHDL 3 09/18/2021   Diabetes Mellitus II: Dx'ed in 03/2020.  - Checking BG at home: Not checking. He reports not checking his blood sugar levels regularly. He mentions changes in his diet, including increased consumption of salads, water, and using honey and cinnamon in his coffee instead of stevias and sugar.  Due to shortage on Ozempic, he was changed to Trulicity, which he started last week. He has tolerated well. Ozempic 0.5 mg was causing nausea. He is also on Synjardy XR 25-1000 mg daily.  He reports that he previously experienced numbness in his feet, which has since resolved. He attributes the numbness to worn-out boots. He has not yet had his eye exam. - Negative for symptoms of hypoglycemia, polyuria, polydipsia, or foot ulcers/trauma  Lab Results  Component Value Date   HGBA1C 8.8 (A) 06/02/2022   Lab Results  Component Value Date   MICROALBUR <0.7 09/06/2021   Review of Systems  Constitutional:  Negative for activity change, appetite change and fever.  HENT:  Negative for mouth sores and sore throat.   Eyes:  Negative for redness and visual disturbance.  Respiratory:  Negative for cough, shortness of breath and wheezing.   Cardiovascular:  Negative for chest pain, palpitations and leg swelling.  Gastrointestinal:  Negative for abdominal pain, nausea and vomiting.  Genitourinary:  Negative for decreased urine volume, dysuria and hematuria.  Skin:  Negative for rash.  Neurological:  Negative for syncope, weakness and headaches.  See other pertinent positives and negatives in HPI.  Current Outpatient Medications on File Prior to Visit  Medication Sig  Dispense Refill   atorvastatin (LIPITOR) 20 MG tablet Take 1 tablet (20 mg total) by mouth daily. 90 tablet 3   Continuous Blood Gluc Receiver (DEXCOM G7 RECEIVER) DEVI 1 Device by Does not apply route as directed. 1 each 1   Continuous Blood Gluc Sensor (DEXCOM G6 SENSOR) MISC Change sensor q 10 days. 3 each 1   Continuous Blood Gluc Sensor (DEXCOM G7 SENSOR) MISC Change sensor q 10 days. 3 each 1   Continuous Blood Gluc Transmit (DEXCOM G6 TRANSMITTER) MISC 1 Device by Does not apply route every 3 (three) months. 1 each 3   Dulaglutide (TRULICITY) 1.5 BT/5.1VO SOPN Inject 1.5 mg into the skin once a week. 2 mL 2   Empagliflozin-metFORMIN HCl ER (SYNJARDY XR) 25-1000 MG TB24 Take 1 tablet by mouth daily with breakfast. 90 tablet 2   fexofenadine-pseudoephedrine (ALLEGRA-D 24) 180-240 MG 24 hr tablet Take 1 tablet by mouth daily. 30 tablet 11   mometasone (NASONEX) 50 MCG/ACT nasal spray Place 2 sprays into the nose daily. 1 each 3   montelukast (SINGULAIR) 10 MG tablet Take 1 tablet (10 mg total) by mouth at bedtime. 30 tablet 4   pantoprazole (PROTONIX) 40 MG tablet Take 1 tablet by mouth once daily 90 tablet 2   No current facility-administered medications on file prior to visit.    Past Medical History:  Diagnosis Date   Allergy    Arthritis    Complication of anesthesia    years ago used to hyperventilate no recent problems    GERD (gastroesophageal reflux disease)  History of chicken pox    Type 2 diabetes mellitus ([facility])    Allergies  Allergen Reactions   Oxycodone Shortness Of Breath and Other (See Comments)    Hallucinations   Vicodin [Hydrocodone-Acetaminophen] Shortness Of Breath and Other (See Comments)    Hallucinations    Social History   Socioeconomic History   Marital status: Married    Spouse name: Not on file   Number of children: Not on file   Years of education: Not on file   Highest education level: Not on file  Occupational History   Not on file   Tobacco Use   Smoking status: Former    Packs/day: 0.75    Years: 20.00    Total pack years: 15.00    Types: Cigarettes, Cigars    Quit date: 08/25/2012    Years since quitting: 10.0   Smokeless tobacco: Former    Types: Snuff    Quit date: 08/26/2015  Vaping Use   Vaping Use: Every day   Start date: 04/04/2018  Substance and Sexual Activity   Alcohol use: Yes    Alcohol/week: 3.0 standard drinks of alcohol    Types: 3 Cans of beer per week    Comment: Occasionally   Drug use: No   Sexual activity: Yes    Birth control/protection: Condom  Other Topics Concern   Not on file  Social History Narrative   Not on file   Social Determinants of Health   Financial Resource Strain: Unknown (09/30/2021)   Overall Financial Resource Strain (CARDIA)    Difficulty of Paying Living Expenses: Patient refused  Food Insecurity: Unknown (09/30/2021)   Hunger Vital Sign    Worried About Running Out of Food in the Last Year: Patient refused    Ran Out of Food in the Last Year: Patient refused  Transportation Needs: Unknown (09/30/2021)   PRAPARE - Transportation    Lack of Transportation (Medical): Patient refused    Lack of Transportation (Non-Medical): Patient refused  Physical Activity: Unknown (09/30/2021)   Exercise Vital Sign    Days of Exercise per Week: 0 days    Minutes of Exercise per Session: Not on file  Stress: Not on file  Social Connections: Unknown (09/30/2021)   Social Connection and Isolation Panel [NHANES]    Frequency of Communication with Friends and Family: Patient refused    Frequency of Social Gatherings with Friends and Family: Patient refused    Attends Religious Services: Patient refused    Database administrator or Organizations: Patient refused    Attends Banker Meetings: Not on file    Marital Status: Patient refused   Today's Vitals   09/02/22 1433  BP: 120/80  Pulse: 88  Resp: 16  Temp: 97.7 F (36.5 C)  TempSrc: Oral  SpO2: 98%  Weight: 272  lb 6 oz (123.5 kg)  Height: 6\' 5"  (1.956 m)   Body mass index is 32.3 kg/m.  Physical Exam Vitals and nursing note reviewed.  Constitutional:      General: He is not in acute distress.    Appearance: He is well-developed.  HENT:     Head: Normocephalic and atraumatic.     Mouth/Throat:     Mouth: Mucous membranes are moist.     Pharynx: Oropharynx is clear.  Eyes:     Conjunctiva/sclera: Conjunctivae normal.  Cardiovascular:     Rate and Rhythm: Normal rate and regular rhythm.     Pulses:  Dorsalis pedis pulses are 2+ on the right side and 2+ on the left side.     Heart sounds: No murmur heard. Pulmonary:     Effort: Pulmonary effort is normal. No respiratory distress.     Breath sounds: Normal breath sounds.  Abdominal:     Palpations: Abdomen is soft. There is no hepatomegaly or mass.     Tenderness: There is no abdominal tenderness.  Lymphadenopathy:     Cervical: No cervical adenopathy.  Skin:    General: Skin is warm.     Findings: No erythema or rash.  Neurological:     Mental Status: He is alert and oriented to person, place, and time.     Cranial Nerves: No cranial nerve deficit.     Gait: Gait normal.  Psychiatric:        Mood and Affect: Mood and affect normal.    Diabetic Foot Exam - Simple   Simple Foot Form Diabetic Foot exam was performed with the following findings: Yes 09/02/2022  3:22 PM  Visual Inspection See comments: Yes Sensation Testing Intact to touch and monofilament testing bilaterally: Yes Pulse Check Posterior Tibialis and Dorsalis pulse intact bilaterally: Yes Comments Plantar calluses bilateral, flat feet.   ASSESSMENT AND PLAN:  Brendan Macdonald was seen today for follow-up.  Diagnoses and all orders for this visit: Lab Results  Component Value Date   HGBA1C 7.5 (A) 09/02/2022   Type 2 diabetes mellitus with other specified complication, without long-term current use of insulin ([place]) Assessment & Plan: Problem has not been  well controlled. HgA1C went from 8.8 to 7.3. He just started Trulicity a week ago after been off of Ozempic x 3 weeks due to med shortage. Continue Trulicity 1.5 mg weekly and Syndardy XR 25-1000 mg daily. Annual eye exam, periodic dental and foot care recommended.  He is overdue for eye exam. F/U in 5 months.  Orders: -     POCT glycosylated hemoglobin (Hb A1C) -     Comprehensive metabolic panel; Future -     Microalbumin / creatinine urine ratio; Future  Hyperlipidemia associated with type 2 diabetes mellitus ([place]) Assessment & Plan: LDL was 61 in 08/2021. Continue Atorvastatin 20 mg daily and low fat diet. Further recommendations according to lipid panel results.  Orders: -     Comprehensive metabolic panel; Future -     Lipid panel; Future   Return in about 4 months (around 01/07/2023) for chronic problems.  Helton Oleson G. Martinique, MD  [facility]. [place] office.

## 2022-09-02 ENCOUNTER — Encounter: Payer: Self-pay | Admitting: Family Medicine

## 2022-09-02 ENCOUNTER — Ambulatory Visit: Payer: 59 | Admitting: Family Medicine

## 2022-09-02 VITALS — BP 120/80 | HR 88 | Temp 97.7°F | Resp 16 | Ht 77.0 in | Wt 272.4 lb

## 2022-09-02 DIAGNOSIS — E1169 Type 2 diabetes mellitus with other specified complication: Secondary | ICD-10-CM | POA: Diagnosis not present

## 2022-09-02 DIAGNOSIS — E785 Hyperlipidemia, unspecified: Secondary | ICD-10-CM | POA: Diagnosis not present

## 2022-09-02 LAB — POCT GLYCOSYLATED HEMOGLOBIN (HGB A1C): Hemoglobin A1C: 7.5 % — AB (ref 4.0–5.6)

## 2022-09-02 MED ORDER — TRULICITY 1.5 MG/0.5ML ~~LOC~~ SOAJ: 1.5000 mg | SUBCUTANEOUS | 2 refills | Status: DC

## 2022-09-02 NOTE — Assessment & Plan Note (Signed)
LDL was 61 in 08/2021. Continue Atorvastatin 20 mg daily and low fat diet. Further recommendations according to lipid panel results.

## 2022-09-02 NOTE — Assessment & Plan Note (Signed)
Problem has not been well controlled. HgA1C went from 8.8 to 7.3. He just started Trulicity a week ago after been off of Ozempic x 3 weeks due to med shortage. Continue Trulicity 1.5 mg weekly and Syndardy XR 25-1000 mg daily. Annual eye exam, periodic dental and foot care recommended.  He is overdue for eye exam. F/U in 5 months.

## 2022-09-02 NOTE — Patient Instructions (Signed)
A few things to remember from today's visit:  Type 2 diabetes mellitus with other specified complication, without long-term current use of insulin ([place]) - Plan: POC HgB A1c, Comprehensive metabolic panel, Microalbumin / creatinine urine ratio  Hyperlipidemia associated with type 2 diabetes mellitus ([place]) - Plan: Comprehensive metabolic panel, Lipid panel  Continue Trulicity and Empagliflozin-Metformin. Schedule an eye exam.  If you need refills for medications you take chronically, please call your pharmacy. Do not use My Chart to request refills or for acute issues that need immediate attention. If you send a my chart message, it may take a few days to be addressed, specially if I am not in the office.  Please be sure medication list is accurate. If a new problem present, please set up appointment sooner than planned today.

## 2022-09-03 LAB — COMPREHENSIVE METABOLIC PANEL
ALT: 19 U/L (ref 0–53)
AST: 21 U/L (ref 0–37)
Albumin: 4.5 g/dL (ref 3.5–5.2)
Alkaline Phosphatase: 74 U/L (ref 39–117)
BUN: 12 mg/dL (ref 6–23)
CO2: 26 mEq/L (ref 19–32)
Calcium: 9.6 mg/dL (ref 8.4–10.5)
Chloride: 104 mEq/L (ref 96–112)
Creatinine, Ser: 1.13 mg/dL (ref 0.40–1.50)
GFR: 75.87 mL/min (ref >60.00)
Glucose, Bld: 105 mg/dL — ABNORMAL HIGH (ref 70–99)
Potassium: 4 mEq/L (ref 3.5–5.1)
Sodium: 141 mEq/L (ref 135–145)
Total Bilirubin: 0.4 mg/dL (ref 0.2–1.2)
Total Protein: 7.9 g/dL (ref 6.0–8.3)

## 2022-09-03 LAB — LIPID PANEL
Cholesterol: 182 mg/dL (ref 0–200)
HDL: 53.4 mg/dL (ref >39.00)
LDL Cholesterol: 102 mg/dL — ABNORMAL HIGH (ref 0–99)
NonHDL: 128.11
Total CHOL/HDL Ratio: 3
Triglycerides: 133 mg/dL (ref 0.0–149.0)
VLDL: 26.6 mg/dL (ref 0.0–40.0)

## 2022-09-03 LAB — MICROALBUMIN / CREATININE URINE RATIO
Creatinine,U: 68.8 mg/dL
Microalb Creat Ratio: 1 mg/g (ref 0.0–30.0)
Microalb, Ur: <0.7 mg/dL (ref 0.0–1.9)

## 2022-09-08 ENCOUNTER — Other Ambulatory Visit: Payer: Self-pay | Admitting: Family Medicine

## 2022-09-08 DIAGNOSIS — E1169 Type 2 diabetes mellitus with other specified complication: Secondary | ICD-10-CM

## 2022-09-08 MED ORDER — ATORVASTATIN CALCIUM 20 MG PO TABS: 20.0000 mg | ORAL_TABLET | Freq: Every day | ORAL | 3 refills | Status: DC

## 2022-09-09 ENCOUNTER — Encounter: Payer: Self-pay | Admitting: Family Medicine

## 2022-09-12 ENCOUNTER — Encounter: Payer: Self-pay | Admitting: Gastroenterology

## 2022-09-12 ENCOUNTER — Ambulatory Visit (INDEPENDENT_AMBULATORY_CARE_PROVIDER_SITE_OTHER): Payer: 59 | Admitting: Gastroenterology

## 2022-09-12 VITALS — BP 120/70 | HR 82 | Ht 77.0 in | Wt 274.0 lb

## 2022-09-12 DIAGNOSIS — Z1211 Encounter for screening for malignant neoplasm of colon: Secondary | ICD-10-CM | POA: Diagnosis not present

## 2022-09-12 DIAGNOSIS — K219 Gastro-esophageal reflux disease without esophagitis: Secondary | ICD-10-CM

## 2022-09-12 DIAGNOSIS — R1310 Dysphagia, unspecified: Secondary | ICD-10-CM

## 2022-09-12 MED ORDER — NA SULFATE-K SULFATE-MG SULF 17.5-3.13-1.6 GM/177ML PO SOLN: 1.0000 | Freq: Once | ORAL | 0 refills | Status: AC

## 2022-09-12 NOTE — Patient Instructions (Signed)
You have been scheduled for an endoscopy and colonoscopy. Please follow the written instructions given to you at your visit today. Please pick up your prep supplies at the pharmacy within the next 1-3 days. If you use inhalers (even only as needed), please bring them with you on the day of your procedure.    _______________________________________________________  If your blood pressure at your visit was 140/90 or greater, please contact your primary care physician to follow up on this.  _______________________________________________________  If you are age 4 or younger, your body mass index should be between 19-25. Your Body mass index is 32.49 kg/m. If this is out of the aformentioned range listed, please consider follow up with your Primary Care Provider.   __________________________________________________________  The [facility] providers would like to encourage you to use [facility] to communicate with providers for non-urgent requests or questions.  Due to long hold times on the telephone, sending your provider a message by [facility] may be a faster and more efficient way to get a response.  Please allow 48 business hours for a response.  Please remember that this is for non-urgent requests.   Due to recent changes in healthcare laws, you may see the results of your imaging and laboratory studies on MyChart before your provider has had a chance to review them.  We understand that in some cases there may be results that are confusing or concerning to you. Not all laboratory results come back in the same time frame and the provider may be waiting for multiple results in order to interpret others.  Please give [place] 48 hours in order for your provider to thoroughly review all the results before contacting the office for clarification of your results.   Thank you for choosing me and [facility].  Vito Cirigliano, D.O.

## 2022-09-12 NOTE — Progress Notes (Signed)
Chief Complaint: Dysphagia, colon cancer screening   Referring Provider:     Martinique, [name], MD   HPI:     Brendan Macdonald. is a 52 y.o. male with a history of diabetes, HLD, GERD, cholecystectomy, referred to the Gastroenterology Clinic for evaluation of dysphagia and colon cancer screening.  Intermittent solid food dysphagia for the last year or so. Points to suprasternal notch.  No history of food impactions.  No previous EGD.  Long hx of GERD with index sxs of HB, regurgitation, waterbrash for many years. Controlled with Protonix 40 mg daily for the last 2-3 years.   No prior EGD/colonoscopy.   Mother had GB cancer. No known family history of CRC or IBD.        Latest Ref Rng & Units 09/02/2022    3:23 PM 09/06/2021    2:40 PM 12/21/2020    4:25 PM  CMP  Glucose 70 - 99 mg/dL 105  80  129   BUN 6 - 23 mg/dL 12  13  12    Creatinine 0.40 - 1.50 mg/dL 1.13  1.26  1.14   Sodium 135 - 145 mEq/L 141  142  140   Potassium 3.5 - 5.1 mEq/L 4.0  3.9  3.6   Chloride 96 - 112 mEq/L 104  103  106   CO2 19 - 32 mEq/L 26  31  23    Calcium 8.4 - 10.5 mg/dL 9.6  9.5  9.8   Total Protein 6.0 - 8.3 g/dL 7.9     Total Bilirubin 0.2 - 1.2 mg/dL 0.4     Alkaline Phos 39 - 117 U/L 74     AST 0 - 37 U/L 21     ALT 0 - 53 U/L 19        Past Medical History:  Diagnosis Date   Allergy    Arthritis    Complication of anesthesia    years ago used to hyperventilate no recent problems    GERD (gastroesophageal reflux disease)    History of chicken pox    Type 2 diabetes mellitus ([place])      Past Surgical History:  Procedure Laterality Date   Arthroscopy  Bilateral    Knees   BACK SURGERY     CHOLECYSTECTOMY N/A 02/24/2013   Procedure: LAPAROSCOPIC CHOLECYSTECTOMY;  Surgeon: Jamesetta So, MD;  Location: [facility];  Service: General;  Laterality: N/A;   EXAM UNDER ANESTHESIA WITH MANIPULATION OF KNEE Right 03/23/2015   Procedure: RIGHT EXAM UNDER ANESTHESIA WITH  MANIPULATION UNDER ANESTHESIA;  Surgeon: Susa Day, MD;  Location: [facility];  Service: Orthopedics;  Laterality: Right;   KNEE ARTHROSCOPY WITH PATELLAR TENDON REPAIR Right 03/23/2015   Procedure: RIGHT KNEE ARTHROSCOPY WITH DEBRIDEMENT;  Surgeon: Susa Day, MD;  Location: [facility];  Service: Orthopedics;  Laterality: Right;   LYSIS OF ADHESION Right 03/23/2015   Procedure: LYSIS OF SCAR TISSUE;  Surgeon: Susa Day, MD;  Location: [facility];  Service: Orthopedics;  Laterality: Right;   ORTHOPEDIC SURGERY     left knee quad tendon tear and patella tear    QUADRICEPS TENDON REPAIR Right 10/18/2014   Procedure: RIGHT QUADRICEP TENDON REPAIR ;  Surgeon: Johnn Hai, MD;  Location: [facility];  Service: Orthopedics;  Laterality: Right;   ROTATOR CUFF REPAIR Right    Family History  Problem Relation Age of Onset   Cancer Mother  ovarian and abdomen   Diabetes Mother    Hypertension Mother    Arthritis Mother    Heart disease Father    Hypertension Father    Cancer Father        kidney and bladder    Arthritis Father    Hearing loss Father    Kidney disease Father    Arthritis Sister    Stroke Maternal Grandmother    Stroke Paternal Grandmother    Arthritis Sister    Lupus Sister    Social History   Tobacco Use   Smoking status: Former    Packs/day: 0.75    Years: 20.00    Total pack years: 15.00    Types: Cigarettes, Cigars    Quit date: 08/25/2012    Years since quitting: 10.0   Smokeless tobacco: Former    Types: Snuff    Quit date: 08/26/2015  Vaping Use   Vaping Use: Every day   Start date: 04/04/2018  Substance Use Topics   Alcohol use: Yes    Alcohol/week: 3.0 standard drinks of alcohol    Types: 3 Cans of beer per week    Comment: Occasionally   Drug use: No   Current Outpatient Medications  Medication Sig Dispense Refill   atorvastatin (LIPITOR) 20 MG tablet Take 1 tablet (20 mg total) by mouth daily. 90 tablet 3   Continuous Blood Gluc Receiver (DEXCOM  G7 RECEIVER) DEVI 1 Device by Does not apply route as directed. 1 each 1   Continuous Blood Gluc Sensor (DEXCOM G6 SENSOR) MISC Change sensor q 10 days. 3 each 1   Continuous Blood Gluc Sensor (DEXCOM G7 SENSOR) MISC Change sensor q 10 days. 3 each 1   Continuous Blood Gluc Transmit (DEXCOM G6 TRANSMITTER) MISC 1 Device by Does not apply route every 3 (three) months. 1 each 3   Dulaglutide (TRULICITY) 1.5 MG/0.5ML SOPN Inject 1.5 mg into the skin once a week. 6 mL 2   Empagliflozin-metFORMIN HCl ER (SYNJARDY XR) 25-1000 MG TB24 Take 1 tablet by mouth daily with breakfast. 90 tablet 2   fexofenadine-pseudoephedrine (ALLEGRA-D 24) 180-240 MG 24 hr tablet Take 1 tablet by mouth daily. 30 tablet 11   mometasone (NASONEX) 50 MCG/ACT nasal spray Place 2 sprays into the nose daily. 1 each 3   pantoprazole (PROTONIX) 40 MG tablet Take 1 tablet by mouth once daily 90 tablet 2   No current facility-administered medications for this visit.   Allergies  Allergen Reactions   Oxycodone Shortness Of Breath and Other (See Comments)    Hallucinations   Vicodin [Hydrocodone-Acetaminophen] Shortness Of Breath and Other (See Comments)    Hallucinations     Review of Systems: All systems reviewed and negative except where noted in HPI.     Physical Exam:    Wt Readings from Last 3 Encounters:  09/12/22 274 lb (124.3 kg)  09/02/22 272 lb 6 oz (123.5 kg)  06/02/22 274 lb 4 oz (124.4 kg)    BP 120/70   Pulse 82   Ht 6\' 5"  (1.956 m)   Wt 274 lb (124.3 kg)   BMI 32.49 kg/m  Constitutional:  Pleasant, in no acute distress. Psychiatric: Normal mood and affect. Behavior is normal. Cardiovascular: Normal rate, regular rhythm. No edema Pulmonary/chest: Effort normal and breath sounds normal. No wheezing, rales or rhonchi. Abdominal: Soft, nondistended, nontender. Bowel sounds active throughout. There are no masses palpable. No hepatomegaly. Neurological: Alert and oriented to person place and  time. Skin: Skin is  warm and dry. No rashes noted.   ASSESSMENT AND PLAN;   1) Dysphagia - EGD with esophageal dilation and/or biopsies as appropriate - Recommend cutting food into small pieces, eat small bites, chew food thoroughly and with plenty of liquids to avoid food impaction.  2) GERD - Well-controlled on current therapy - Continue Protonix - Continue antireflux lifestyle/dietary modifications with avoidance of exacerbating type foods - EGD to evaluate for erosive esophagitis, LES laxity, hiatal hernia  3) Colon cancer screening - Due for age-appropriate, average risk CRC screening - Schedule colonoscopy to be done in conjunction with EGD as above   The indications, risks, and benefits of EGD and colonoscopy were explained to the patient in detail. Risks include but are not limited to bleeding, perforation, adverse reaction to medications, and cardiopulmonary compromise. Sequelae include but are not limited to the possibility of surgery, hospitalization, and mortality. The patient verbalized understanding and wished to proceed. All questions answered, referred to scheduler and bowel prep ordered. Further recommendations pending results of the exam.     Lavena Bullion, DO, FACG  09/12/2022, 3:52 PM   Martinique, [name], MD

## 2022-09-15 ENCOUNTER — Encounter: Payer: Self-pay | Admitting: Family Medicine

## 2022-09-16 ENCOUNTER — Telehealth: Payer: Managed Care, Other (non HMO) | Admitting: Physician Assistant

## 2022-09-16 DIAGNOSIS — B9689 Other specified bacterial agents as the cause of diseases classified elsewhere: Secondary | ICD-10-CM | POA: Diagnosis not present

## 2022-09-16 DIAGNOSIS — J019 Acute sinusitis, unspecified: Secondary | ICD-10-CM | POA: Diagnosis not present

## 2022-09-16 DIAGNOSIS — H66001 Acute suppurative otitis media without spontaneous rupture of ear drum, right ear: Secondary | ICD-10-CM

## 2022-09-16 MED ORDER — AMOXICILLIN-POT CLAVULANATE 875-125 MG PO TABS: 1.0000 | ORAL_TABLET | Freq: Two times a day (BID) | ORAL | 0 refills | Status: DC

## 2022-09-16 MED ORDER — NEOMYCIN-POLYMYXIN-HC 3.5-10000-1 OT SOLN: 3.0000 [drp] | Freq: Four times a day (QID) | OTIC | 0 refills | Status: DC

## 2022-09-16 NOTE — Patient Instructions (Signed)
Brendan Kass., thank you for joining Brendan Loveless, PA-C for today's virtual visit.  While this provider is not your primary care provider (PCP), if your PCP is located in our provider database this encounter information will be shared with them immediately following your visit.   A [facility] MyChart account gives you access to today's visit and all your visits, tests, and labs performed at [facility] " click here if you don't have a [facility] MyChart account or go to [email or web address]  Consent: (Patient) Brendan Kass. provided verbal consent for this virtual visit at the beginning of the encounter.  Current Medications:  Current Outpatient Medications:    amoxicillin-clavulanate (AUGMENTIN) 875-125 MG tablet, Take 1 tablet by mouth 2 (two) times daily., Disp: 20 tablet, Rfl: 0   neomycin-polymyxin-hydrocortisone (CORTISPORIN) OTIC solution, Place 3 drops into the right ear 4 (four) times daily. X 7 days, Disp: 10 mL, Rfl: 0   atorvastatin (LIPITOR) 20 MG tablet, Take 1 tablet (20 mg total) by mouth daily., Disp: 90 tablet, Rfl: 3   Continuous Blood Gluc Receiver (DEXCOM G7 RECEIVER) DEVI, 1 Device by Does not apply route as directed., Disp: 1 each, Rfl: 1   Continuous Blood Gluc Sensor (DEXCOM G6 SENSOR) MISC, Change sensor q 10 days., Disp: 3 each, Rfl: 1   Continuous Blood Gluc Sensor (DEXCOM G7 SENSOR) MISC, Change sensor q 10 days., Disp: 3 each, Rfl: 1   Continuous Blood Gluc Transmit (DEXCOM G6 TRANSMITTER) MISC, 1 Device by Does not apply route every 3 (three) months., Disp: 1 each, Rfl: 3   Dulaglutide (TRULICITY) 1.5 MG/0.5ML SOPN, Inject 1.5 mg into the skin once a week., Disp: 6 mL, Rfl: 2   Empagliflozin-metFORMIN HCl ER (SYNJARDY XR) 25-1000 MG TB24, Take 1 tablet by mouth daily with breakfast., Disp: 90 tablet, Rfl: 2   fexofenadine-pseudoephedrine (ALLEGRA-D 24) 180-240 MG 24 hr tablet, Take 1 tablet by mouth daily., Disp: 30  tablet, Rfl: 11   mometasone (NASONEX) 50 MCG/ACT nasal spray, Place 2 sprays into the nose daily., Disp: 1 each, Rfl: 3   pantoprazole (PROTONIX) 40 MG tablet, Take 1 tablet by mouth once daily, Disp: 90 tablet, Rfl: 2   Medications ordered in this encounter:  Meds ordered this encounter  Medications   amoxicillin-clavulanate (AUGMENTIN) 875-125 MG tablet    Sig: Take 1 tablet by mouth 2 (two) times daily.    Dispense:  20 tablet    Refill:  0    Order Specific Question:   Supervising Provider    Answer:   Merrilee Jansky [id number]   neomycin-polymyxin-hydrocortisone (CORTISPORIN) OTIC solution    Sig: Place 3 drops into the right ear 4 (four) times daily. X 7 days    Dispense:  10 mL    Refill:  0    Order Specific Question:   Supervising Provider    Answer:   Merrilee Jansky [[id number]]     *If you need refills on other medications prior to your next appointment, please contact your pharmacy*  Follow-Up: Call back or seek an in-person evaluation if the symptoms worsen or if the condition fails to improve as anticipated.  [facility] [phone]  Other Instructions  Otitis Media, Adult  Otitis media is a condition in which the middle ear is red and swollen (inflamed) and full of fluid. The middle ear is the part of the ear that contains bones for hearing as well as air that helps send sounds  to the brain. The condition usually goes away on its own. What are the causes? This condition is caused by a blockage in the eustachian tube. This tube connects the middle ear to the back of the nose. It normally allows air into the middle ear. The blockage is caused by fluid or swelling. Problems that can cause blockage include: A cold or infection that affects the nose, mouth, or throat. Allergies. An irritant, such as tobacco smoke. Adenoids that have become large. The adenoids are soft tissue located in the back of the throat, behind the nose and the roof of the  mouth. Growth or swelling in the upper part of the throat, just behind the nose (nasopharynx). Damage to the ear caused by a change in pressure. This is called barotrauma. What increases the risk? You are more likely to develop this condition if you: Smoke or are exposed to tobacco smoke. Have an opening in the roof of your mouth (cleft palate). Have acid reflux. Have problems in your body's defense system (immune system). What are the signs or symptoms? Symptoms of this condition include: Ear pain. Fever. Problems with hearing. Being tired. Fluid leaking from the ear. Ringing in the ear. How is this treated? This condition can go away on its own within 3-5 days. But if the condition is caused by germs (bacteria) and does not go away on its own, or if it keeps coming back, your doctor may: Give you antibiotic medicines. Give you medicines for pain. Follow these instructions at home: Take over-the-counter and prescription medicines only as told by your doctor. If you were prescribed an antibiotic medicine, take it as told by your doctor. Do not stop taking it even if you start to feel better. Keep all follow-up visits. Contact a doctor if: You have bleeding from your nose. There is a lump on your neck. You are not feeling better in 5 days. You feel worse instead of better. Get help right away if: You have pain that is not helped with medicine. You have swelling, redness, or pain around your ear. You get a stiff neck. You cannot move part of your face (paralysis). You notice that the bone behind your ear hurts when you touch it. You get a very bad headache. Summary Otitis media means that the middle ear is red, swollen, and full of fluid. This condition usually goes away on its own. If the problem does not go away, treatment may be needed. You may be given medicines to treat the infection or to treat your pain. If you were prescribed an antibiotic medicine, take it as told by  your doctor. Do not stop taking it even if you start to feel better. Keep all follow-up visits. This information is not intended to replace advice given to you by your health care provider. Make sure you discuss any questions you have with your health care provider. Document Revised: 11/19/2020 Document Reviewed: 11/19/2020 Elsevier Patient Education  [place].   Sinus Infection, Adult A sinus infection is soreness and swelling (inflammation) of your sinuses. Sinuses are hollow spaces in the bones around your face. They are located: Around your eyes. In the middle of your forehead. Behind your nose. In your cheekbones. Your sinuses and nasal passages are lined with a fluid called mucus. Mucus drains out of your sinuses. Swelling can trap mucus in your sinuses. This lets germs (bacteria, virus, or fungus) grow, which leads to infection. Most of the time, this condition is caused by a  virus. What are the causes? Allergies. Asthma. Germs. Things that block your nose or sinuses. Growths in the nose (nasal polyps). Chemicals or irritants in the air. A fungus. This is rare. What increases the risk? Having a weak body defense system (immune system). Doing a lot of swimming or diving. Using nasal sprays too much. Smoking. What are the signs or symptoms? The main symptoms of this condition are pain and a feeling of pressure around the sinuses. Other symptoms include: Stuffy nose (congestion). This may make it hard to breathe through your nose. Runny nose (drainage). Soreness, swelling, and warmth in the sinuses. A cough that may get worse at night. Being unable to smell and taste. Mucus that collects in the throat or the back of the nose (postnasal drip). This may cause a sore throat or bad breath. Being very tired (fatigued). A fever. How is this diagnosed? Your symptoms. Your medical history. A physical exam. Tests to find out if your condition is short-term (acute) or  long-term (chronic). Your doctor may: Check your nose for growths (polyps). Check your sinuses using a tool that has a light on one end (endoscope). Check for allergies or germs. Do imaging tests, such as an MRI or CT scan. How is this treated? Treatment for this condition depends on the cause and whether it is short-term or long-term. If caused by a virus, your symptoms should go away on their own within 10 days. You may be given medicines to relieve symptoms. They include: Medicines that shrink swollen tissue in the nose. A spray that treats swelling of the nostrils. Rinses that help get rid of thick mucus in your nose (nasal saline washes). Medicines that treat allergies (antihistamines). Over-the-counter pain relievers. If caused by bacteria, your doctor may wait to see if you will get better without treatment. You may be given antibiotic medicine if you have: A very bad infection. A weak body defense system. If caused by growths in the nose, surgery may be needed. Follow these instructions at home: Medicines Take, use, or apply over-the-counter and prescription medicines only as told by your doctor. These may include nasal sprays. If you were prescribed an antibiotic medicine, take it as told by your doctor. Do not stop taking it even if you start to feel better. Hydrate and humidify  Drink enough water to keep your pee (urine) pale yellow. Use a cool mist humidifier to keep the humidity level in your home above 50%. Breathe in steam for 10-15 minutes, 3-4 times a day, or as told by your doctor. You can do this in the bathroom while a hot shower is running. Try not to spend time in cool or dry air. Rest Rest as much as you can. Sleep with your head raised (elevated). Make sure you get enough sleep each night. General instructions  Put a warm, moist washcloth on your face 3-4 times a day, or as often as told by your doctor. Use nasal saline washes as often as told by your  doctor. Wash your hands often with soap and water. If you cannot use soap and water, use hand sanitizer. Do not smoke. Avoid being around people who are smoking (secondhand smoke). Keep all follow-up visits. Contact a doctor if: You have a fever. Your symptoms get worse. Your symptoms do not get better within 10 days. Get help right away if: You have a very bad headache. You cannot stop vomiting. You have very bad pain or swelling around your face or eyes. You have  trouble seeing. You feel confused. Your neck is stiff. You have trouble breathing. These symptoms may be an emergency. Get help right away. Call 911. Do not wait to see if the symptoms will go away. Do not drive yourself to the hospital. Summary A sinus infection is swelling of your sinuses. Sinuses are hollow spaces in the bones around your face. This condition is caused by tissues in your nose that become inflamed or swollen. This traps germs. These can lead to infection. If you were prescribed an antibiotic medicine, take it as told by your doctor. Do not stop taking it even if you start to feel better. Keep all follow-up visits. This information is not intended to replace advice given to you by your health care provider. Make sure you discuss any questions you have with your health care provider. Document Revised: 07/16/2021 Document Reviewed: 07/16/2021 Elsevier Patient Education  2023 Elsevier Inc.    If you have been instructed to have an in-person evaluation today at a local Urgent Care facility, please use the link below. It will take you to a list of all of our available [facility], including address, phone number and hours of operation. Please do not delay care.  [facility]  If you or a family member do not have a primary care provider, use the link below to schedule a visit and establish care. When you choose a [facility] primary care physician or advanced practice provider, you  gain a long-term partner in health. Find a Primary Care Provider  Learn more about [facility]'s in-office and virtual care options: [facility] - Get Care Now

## 2022-09-16 NOTE — Progress Notes (Signed)
Virtual Visit Consent   Brendan Spiering., you are scheduled for a virtual visit with a [facility] provider today. Just as with appointments in the office, your consent must be obtained to participate. Your consent will be active for this visit and any virtual visit you may have with one of our providers in the next 365 days. If you have a MyChart account, a copy of this consent can be sent to you electronically.  As this is a virtual visit, video technology does not allow for your provider to perform a traditional examination. This may limit your provider's ability to fully assess your condition. If your provider identifies any concerns that need to be evaluated in person or the need to arrange testing (such as labs, EKG, etc.), we will make arrangements to do so. Although advances in technology are sophisticated, we cannot ensure that it will always work on either your end or our end. If the connection with a video visit is poor, the visit may have to be switched to a telephone visit. With either a video or telephone visit, we are not always able to ensure that we have a secure connection.  By engaging in this virtual visit, you consent to the provision of healthcare and authorize for your insurance to be billed (if applicable) for the services provided during this visit. Depending on your insurance coverage, you may receive a charge related to this service.  I need to obtain your verbal consent now. Are you willing to proceed with your visit today? Brendan Peper Christia Coaxum. has provided verbal consent on 09/16/2022 for a virtual visit (video or telephone). Mar Daring, PA-C  Date: 09/16/2022 10:18 AM  Virtual Visit via Video Note   I, Mar Daring, connected with  Brendan Macdonald.  ([id number], 26-Jan-1971) on 09/16/22 at 10:00 AM EST by a video-enabled telemedicine application and verified that I am speaking with the correct person using two  identifiers.  Location: Patient: Virtual Visit Location Patient: Home Provider: Virtual Visit Location Provider: Home Office   I discussed the limitations of evaluation and management by telemedicine and the availability of in person appointments. The patient expressed understanding and agreed to proceed.    History of Present Illness: Brendan Windhorst. is a 52 y.o. who identifies as a male who was assigned male at birth, and is being seen today for possible sinus infection.  HPI: Sinusitis This is a new problem. The current episode started 1 to 4 weeks ago (2 weeks). The problem has been gradually worsening since onset. There has been no fever. He is experiencing no pain. Associated symptoms include congestion, ear pain (right ear, starting in left ear just today), headaches, sinus pressure, a sore throat (radiates from right ear) and swollen glands (right). Pertinent negatives include no chills, coughing or neck pain. Treatments tried: ear drops from [employer], cough medication, alka seltzer. The treatment provided no relief.     Problems:  Patient Active Problem List   Diagnosis Date Noted   Low testosterone in male 12/30/2021   Erectile dysfunction 09/06/2021   Rhinitis 09/21/2020   Chronic left-sided low back pain without sciatica 07/06/2020   Hyperlipidemia associated with type 2 diabetes mellitus ([place]) 10/03/2019   Type 2 diabetes mellitus with other specified complication ([place]) 69/48/5462   Class 1 obesity with body mass index (BMI) of 33.0 to 33.9 in adult 03/28/2019   Gastroesophageal reflux disease 03/28/2019   Rupture of right quadriceps tendon 10/18/2014  Tear of tendon of lower extremity 10/18/2014    Allergies:  Allergies  Allergen Reactions   Oxycodone Shortness Of Breath and Other (See Comments)    Hallucinations   Vicodin [Hydrocodone-Acetaminophen] Shortness Of Breath and Other (See Comments)    Hallucinations   Medications:  Current Outpatient  Medications:    amoxicillin-clavulanate (AUGMENTIN) 875-125 MG tablet, Take 1 tablet by mouth 2 (two) times daily., Disp: 20 tablet, Rfl: 0   neomycin-polymyxin-hydrocortisone (CORTISPORIN) OTIC solution, Place 3 drops into the right ear 4 (four) times daily. X 7 days, Disp: 10 mL, Rfl: 0   atorvastatin (LIPITOR) 20 MG tablet, Take 1 tablet (20 mg total) by mouth daily., Disp: 90 tablet, Rfl: 3   Continuous Blood Gluc Receiver ([place]) DEVI, 1 Device by Does not apply route as directed., Disp: 1 each, Rfl: 1   Continuous Blood Gluc Sensor (DEXCOM G6 SENSOR) MISC, Change sensor q 10 days., Disp: 3 each, Rfl: 1   Continuous Blood Gluc Sensor (DEXCOM G7 SENSOR) MISC, Change sensor q 10 days., Disp: 3 each, Rfl: 1   Continuous Blood Gluc Transmit (DEXCOM G6 TRANSMITTER) MISC, 1 Device by Does not apply route every 3 (three) months., Disp: 1 each, Rfl: 3   Dulaglutide (TRULICITY) 1.5 QQ/7.6PP SOPN, Inject 1.5 mg into the skin once a week., Disp: 6 mL, Rfl: 2   Empagliflozin-metFORMIN HCl ER (SYNJARDY XR) 25-1000 MG TB24, Take 1 tablet by mouth daily with breakfast., Disp: 90 tablet, Rfl: 2   fexofenadine-pseudoephedrine (ALLEGRA-D 24) 180-240 MG 24 hr tablet, Take 1 tablet by mouth daily., Disp: 30 tablet, Rfl: 11   mometasone (NASONEX) 50 MCG/ACT nasal spray, Place 2 sprays into the nose daily., Disp: 1 each, Rfl: 3   pantoprazole (PROTONIX) 40 MG tablet, Take 1 tablet by mouth once daily, Disp: 90 tablet, Rfl: 2  Observations/Objective: Patient is well-developed, well-nourished in no acute distress.  Resting comfortably at home.  Head is normocephalic, atraumatic.  No labored breathing.  Speech is clear and coherent with logical content.  Patient is alert and oriented at baseline.    Assessment and Plan: 1. Acute bacterial sinusitis - amoxicillin-clavulanate (AUGMENTIN) 875-125 MG tablet; Take 1 tablet by mouth 2 (two) times daily.  Dispense: 20 tablet; Refill: 0  2. Non-recurrent  acute suppurative otitis media of right ear without spontaneous rupture of tympanic membrane - neomycin-polymyxin-hydrocortisone (CORTISPORIN) OTIC solution; Place 3 drops into the right ear 4 (four) times daily. X 7 days  Dispense: 10 mL; Refill: 0  - Worsening symptoms that have not responded to OTC medications.  - Will give Augmentin and Cortisporin - Continue saline nasal rinses - Could consider to add Flonase (Fluticasone) nasal spray over the counter for possible eustachian tube dysfunction - Steam and humidifier can help - Warm compress to ear - Stay well hydrated and get plenty of rest.  - Seek in person evaluation if no symptom improvement or if symptoms worsen   Follow Up Instructions: I discussed the assessment and treatment plan with the patient. The patient was provided an opportunity to ask questions and all were answered. The patient agreed with the plan and demonstrated an understanding of the instructions.  A copy of instructions were sent to the patient via MyChart unless otherwise noted below.    The patient was advised to call back or seek an in-person evaluation if the symptoms worsen or if the condition fails to improve as anticipated.  Time:  I spent 10 minutes with the patient via telehealth  technology discussing the above problems/concerns.    Mar Daring, PA-C

## 2022-09-23 MED ORDER — OZEMPIC (0.25 OR 0.5 MG/DOSE) 2 MG/3ML ~~LOC~~ SOPN: 0.5000 mg | PEN_INJECTOR | SUBCUTANEOUS | 3 refills | Status: DC

## 2022-09-23 MED ORDER — RYBELSUS 7 MG PO TABS: 7.0000 mg | ORAL_TABLET | Freq: Every day | ORAL | 1 refills | Status: DC

## 2022-09-23 NOTE — Addendum Note (Signed)
Addended by: Rodrigo Ran on: 09/23/2022 05:27 PM   Modules accepted: Orders

## 2022-09-23 NOTE — Telephone Encounter (Signed)
Patient calling in checking progress of this inquiry.

## 2022-11-14 ENCOUNTER — Encounter: Payer: Self-pay | Admitting: Gastroenterology

## 2022-11-14 ENCOUNTER — Ambulatory Visit: Payer: Managed Care, Other (non HMO) | Admitting: Gastroenterology

## 2022-11-14 VITALS — BP 126/86 | HR 76 | Temp 98.0°F | Resp 10 | Ht 77.0 in | Wt 274.0 lb

## 2022-11-14 DIAGNOSIS — K219 Gastro-esophageal reflux disease without esophagitis: Secondary | ICD-10-CM | POA: Diagnosis not present

## 2022-11-14 DIAGNOSIS — K2990 Gastroduodenitis, unspecified, without bleeding: Secondary | ICD-10-CM | POA: Diagnosis not present

## 2022-11-14 DIAGNOSIS — Z1211 Encounter for screening for malignant neoplasm of colon: Secondary | ICD-10-CM

## 2022-11-14 DIAGNOSIS — K222 Esophageal obstruction: Secondary | ICD-10-CM | POA: Diagnosis not present

## 2022-11-14 DIAGNOSIS — K2970 Gastritis, unspecified, without bleeding: Secondary | ICD-10-CM

## 2022-11-14 DIAGNOSIS — K640 First degree hemorrhoids: Secondary | ICD-10-CM

## 2022-11-14 DIAGNOSIS — R1310 Dysphagia, unspecified: Secondary | ICD-10-CM

## 2022-11-14 DIAGNOSIS — K319 Disease of stomach and duodenum, unspecified: Secondary | ICD-10-CM | POA: Diagnosis not present

## 2022-11-14 MED ORDER — SODIUM CHLORIDE 0.9 % IV SOLN: 500.0000 mL | INTRAVENOUS | Status: DC

## 2022-11-14 NOTE — Progress Notes (Signed)
Called to room to assist during endoscopic procedure.  Patient ID and intended procedure confirmed with present staff. Received instructions for my participation in the procedure from the performing physician.  

## 2022-11-14 NOTE — Patient Instructions (Addendum)
Continue present medications Resume previous diet Await pathology results Return to the GI clinic as necessary There were no polyps seen today!  You will need another screening colonoscopy in 10 years, you will receive a letter at that time when you are due for the procedure.   Please call us at [phone] if you have a change in bowel habits, change in family history of colo-rectal cancer, rectal bleeding or other GI concern before that time.  Handouts given for GERD,gastritis, esophageal stricture, esophageal dilation, hiatal hernia and hemorrhoids.  YOU HAD AN ENDOSCOPIC PROCEDURE TODAY AT [facility]:   Refer to the procedure report that was given to you for any specific questions about what was found during the examination.  If the procedure report does not answer your questions, please call your gastroenterologist to clarify.  If you requested that your care partner not be given the details of your procedure findings, then the procedure report has been included in a sealed envelope for you to review at your convenience later.  YOU SHOULD EXPECT: Some feelings of bloating in the abdomen. Passage of more gas than usual.  Walking can help get rid of the air that was put into your GI tract during the procedure and reduce the bloating. If you had a lower endoscopy (such as a colonoscopy or flexible sigmoidoscopy) you may notice spotting of blood in your stool or on the toilet paper. If you underwent a bowel prep for your procedure, you may not have a normal bowel movement for a few days.  Please Note:  You might notice some irritation and congestion in your nose or some drainage.  This is from the oxygen used during your procedure.  There is no need for concern and it should clear up in a day or so.  SYMPTOMS TO REPORT IMMEDIATELY:  Following lower endoscopy (colonoscopy):  Excessive amounts of blood in the stool  Significant tenderness or worsening of abdominal  pains  Swelling of the abdomen that is new, acute  Fever of 100F or higher Following upper endoscopy (EGD)  Vomiting of blood or coffee ground material  New chest pain or pain under the shoulder blades  Painful or persistently difficult swallowing  New shortness of breath  Fever of 100F or higher  Black, tarry-looking stools  For urgent or emergent issues, a gastroenterologist can be reached at any hour by calling [phone]. Do not use MyChart messaging for urgent concerns.   DIET:  We do recommend a small meal at first, but then you may proceed to your regular diet.  Drink plenty of fluids but you should avoid alcoholic beverages for 24 hours.  ACTIVITY:  You should plan to take it easy for the rest of today and you should NOT DRIVE or use heavy machinery until tomorrow (because of the sedation medicines used during the test).    FOLLOW UP: Our staff will call the number listed on your records the next business day following your procedure.  We will call around 7:15- 8:00 am to check on you and address any questions or concerns that you may have regarding the information given to you following your procedure. If we do not reach you, we will leave a message.     If any biopsies were taken you will be contacted by phone or by letter within the next 1-3 weeks.  Please call us at [phone] if you have not heard about the biopsies in 3 weeks.  SIGNATURES/CONFIDENTIALITY: You and/or your care partner have signed paperwork which will be entered into your electronic medical record.  These signatures attest to the fact that that the information above on your After Visit Summary has been reviewed and is understood.  Full responsibility of the confidentiality of this discharge information lies with you and/or your care-partner.

## 2022-11-14 NOTE — Progress Notes (Signed)
GASTROENTEROLOGY PROCEDURE H&P NOTE   Primary Care Physician: Martinique, [name], MD    Reason for Procedure:   Dysphagia, GERD, heartburn, colon cancer screening  Plan:    EGD, colonoscopy  Patient is appropriate for endoscopic procedure(s) in the ambulatory ([place]) setting.  The nature of the procedure, as well as the risks, benefits, and alternatives were carefully and thoroughly reviewed with the patient. Ample time for discussion and questions allowed. The patient understood, was satisfied, and agreed to proceed.     HPI: Brendan Macdonald. is a 52 y.o. male who presents for EGD for evaluation of dyspahgia, GERD, heartburn, along with colonoscopy for routine CRC screening.   No prior EGD/Colonoscopy.   Past Medical History:  Diagnosis Date   Allergy    Arthritis    Complication of anesthesia    years ago used to hyperventilate no recent problems    GERD (gastroesophageal reflux disease)    History of chicken pox    Hyperlipidemia    Type 2 diabetes mellitus ([place])     Past Surgical History:  Procedure Laterality Date   Arthroscopy  Bilateral    Knees   BACK SURGERY     CHOLECYSTECTOMY N/A 02/24/2013   Procedure: LAPAROSCOPIC CHOLECYSTECTOMY;  Surgeon: Jamesetta So, MD;  Location: [facility];  Service: General;  Laterality: N/A;   EXAM UNDER ANESTHESIA WITH MANIPULATION OF KNEE Right 03/23/2015   Procedure: RIGHT EXAM UNDER ANESTHESIA WITH MANIPULATION UNDER ANESTHESIA;  Surgeon: Susa Day, MD;  Location: [facility];  Service: Orthopedics;  Laterality: Right;   KNEE ARTHROSCOPY WITH PATELLAR TENDON REPAIR Right 03/23/2015   Procedure: RIGHT KNEE ARTHROSCOPY WITH DEBRIDEMENT;  Surgeon: Susa Day, MD;  Location: [facility];  Service: Orthopedics;  Laterality: Right;   LYSIS OF ADHESION Right 03/23/2015   Procedure: LYSIS OF SCAR TISSUE;  Surgeon: Susa Day, MD;  Location: [facility];  Service: Orthopedics;  Laterality: Right;   ORTHOPEDIC SURGERY     left knee quad  tendon tear and patella tear    QUADRICEPS TENDON REPAIR Right 10/18/2014   Procedure: RIGHT QUADRICEP TENDON REPAIR ;  Surgeon: Johnn Hai, MD;  Location: [facility];  Service: Orthopedics;  Laterality: Right;   ROTATOR CUFF REPAIR Right     Prior to Admission medications   Medication Sig Start Date End Date Taking? Authorizing Provider  atorvastatin (LIPITOR) 20 MG tablet Take 1 tablet (20 mg total) by mouth daily. 09/08/22   Martinique, [name], MD  Continuous Blood Gluc Receiver (DEXCOM G7 RECEIVER) DEVI 1 Device by Does not apply route as directed. 06/02/22   Martinique, [name], MD  Continuous Blood Gluc Sensor (DEXCOM G6 SENSOR) MISC Change sensor q 10 days. 12/21/20   Martinique, [name], MD  Continuous Blood Gluc Sensor (DEXCOM G7 SENSOR) MISC Change sensor q 10 days. 06/02/22   Martinique, [name], MD  Continuous Blood Gluc Transmit (DEXCOM G6 TRANSMITTER) MISC 1 Device by Does not apply route every 3 (three) months. 12/21/20   Martinique, [name], MD  Empagliflozin-metFORMIN HCl ER (SYNJARDY XR) 25-1000 MG TB24 Take 1 tablet by mouth daily with breakfast. 12/30/21   Martinique, [name], MD  fexofenadine-pseudoephedrine (ALLEGRA-D 24) 180-240 MG 24 hr tablet Take 1 tablet by mouth daily. 11/28/17   Terald Sleeper, PA-C  mometasone (NASONEX) 50 MCG/ACT nasal spray Place 2 sprays into the nose daily. 05/03/21   Martinique, [name], MD  neomycin-polymyxin-hydrocortisone (CORTISPORIN) OTIC solution Place 3 drops into the right ear 4 (  four) times daily. X 7 days 09/16/22   Mar Daring, PA-C  pantoprazole (PROTONIX) 40 MG tablet Take 1 tablet by mouth once daily 06/27/22   Martinique, [name], MD  Semaglutide,0.25 or 0.5MG /DOS, (OZEMPIC, 0.25 OR 0.5 MG/DOSE,) 2 MG/3ML SOPN Inject 0.5 mg into the skin once a week. 09/23/22   Martinique, [name], MD    Current Outpatient Medications  Medication Sig Dispense Refill   atorvastatin (LIPITOR) 20 MG tablet Take 1 tablet (20 mg total) by mouth daily. 90 tablet 3   Continuous Blood Gluc  Receiver (DEXCOM G7 RECEIVER) DEVI 1 Device by Does not apply route as directed. 1 each 1   Continuous Blood Gluc Sensor (DEXCOM G6 SENSOR) MISC Change sensor q 10 days. 3 each 1   Continuous Blood Gluc Sensor (DEXCOM G7 SENSOR) MISC Change sensor q 10 days. 3 each 1   Continuous Blood Gluc Transmit (DEXCOM G6 TRANSMITTER) MISC 1 Device by Does not apply route every 3 (three) months. 1 each 3   Empagliflozin-metFORMIN HCl ER (SYNJARDY XR) 25-1000 MG TB24 Take 1 tablet by mouth daily with breakfast. 90 tablet 2   fexofenadine-pseudoephedrine (ALLEGRA-D 24) 180-240 MG 24 hr tablet Take 1 tablet by mouth daily. 30 tablet 11   mometasone (NASONEX) 50 MCG/ACT nasal spray Place 2 sprays into the nose daily. 1 each 3   neomycin-polymyxin-hydrocortisone (CORTISPORIN) OTIC solution Place 3 drops into the right ear 4 (four) times daily. X 7 days 10 mL 0   pantoprazole (PROTONIX) 40 MG tablet Take 1 tablet by mouth once daily 90 tablet 2   Semaglutide,0.25 or 0.5MG /DOS, (OZEMPIC, 0.25 OR 0.5 MG/DOSE,) 2 MG/3ML SOPN Inject 0.5 mg into the skin once a week. 3 mL 3   Current Facility-Administered Medications  Medication Dose Route Frequency Provider Last Rate Last Admin   0.9 %  sodium chloride infusion  500 mL Intravenous Continuous Celisse Ciulla V, DO        Allergies as of 11/14/2022 - Review Complete 11/14/2022  Allergen Reaction Noted   Oxycodone Shortness Of Breath and Other (See Comments) 02/23/2013   Vicodin [hydrocodone-acetaminophen] Shortness Of Breath and Other (See Comments) 02/23/2013    Family History  Problem Relation Age of Onset   Cancer Mother        ovarian and abdomen   Diabetes Mother    Hypertension Mother    Arthritis Mother    Heart disease Father    Hypertension Father    Cancer Father        kidney and bladder    Arthritis Father    Hearing loss Father    Kidney disease Father    Arthritis Sister    Arthritis Sister    Lupus Sister    Stroke Maternal Grandmother     Stroke Paternal Grandmother    Colon cancer Neg Hx    Esophageal cancer Neg Hx    Stomach cancer Neg Hx    Rectal cancer Neg Hx     Social History   Socioeconomic History   Marital status: Married    Spouse name: Not on file   Number of children: Not on file   Years of education: Not on file   Highest education level: Not on file  Occupational History   Not on file  Tobacco Use   Smoking status: Some Days    Packs/day: 0.75    Years: 20.00    Additional pack years: 0.00    Total pack years: 15.00  Types: Cigars, Cigarettes    Last attempt to quit: 08/25/2012    Years since quitting: 10.2   Smokeless tobacco: Former    Types: Snuff    Quit date: 08/26/2015  Vaping Use   Vaping Use: Former   Start date: 04/04/2018  Substance and Sexual Activity   Alcohol use: Yes    Alcohol/week: 3.0 standard drinks of alcohol    Types: 3 Cans of beer per week    Comment: Occasionally   Drug use: No   Sexual activity: Yes    Birth control/protection: Condom  Other Topics Concern   Not on file  Social History Narrative   Not on file   Social Determinants of Health   Financial Resource Strain: Unknown (09/30/2021)   Overall Financial Resource Strain (CARDIA)    Difficulty of Paying Living Expenses: Patient declined  Food Insecurity: Unknown (09/30/2021)   Hunger Vital Sign    Worried About [place] in the Last Year: Patient declined    [place] in the Last Year: Patient declined  Transportation Needs: Unknown (09/30/2021)   PRAPARE - Hydrologist (Medical): Patient declined    Lack of Transportation (Non-Medical): Patient declined  Physical Activity: Unknown (09/30/2021)   Exercise Vital Sign    Days of Exercise per Week: 0 days    Minutes of Exercise per Session: Not on file  Stress: Not on file  Social Connections: Unknown (09/30/2021)   Social Connection and Isolation Panel [NHANES]    Frequency of Communication with Friends and  Family: Patient declined    Frequency of Social Gatherings with Friends and Family: Patient declined    Attends Religious Services: Patient declined    Marine scientist or Organizations: Patient declined    Attends Music therapist: Not on file    Marital Status: Patient declined  Human resources officer Violence: Not on file    Physical Exam: Vital signs in last 24 hours: @BP  (!) 151/83   Pulse 82   Temp 98 F (36.7 C)   Ht 6\' 5"  (1.956 m)   Wt 274 lb (124.3 kg)   SpO2 97%   BMI 32.49 kg/m  GEN: NAD EYE: Sclerae anicteric ENT: MMM CV: Non-tachycardic Pulm: CTA b/l GI: Soft, NT/ND NEURO:  Alert & Oriented x [place], DO [facility]   11/14/2022 10:54 AM

## 2022-11-14 NOTE — Op Note (Signed)
[place] Patient Name: Brendan Macdonald Procedure Date: 11/14/2022 10:59 AM MRN: [id number] Endoscopist: Gerrit Heck , MD, [id number] Age: 52 Referring MD:  Date of Birth: 1971-06-18 Gender: Male Account #: [id number] Procedure:                Colonoscopy Indications:              Screening for colorectal malignant neoplasm, This                            is the patient's first colonoscopy Medicines:                Monitored Anesthesia Care Procedure:                Pre-Anesthesia Assessment:                           - Prior to the procedure, a History and Physical                            was performed, and patient medications and                            allergies were reviewed. The patient's tolerance of                            previous anesthesia was also reviewed. The risks                            and benefits of the procedure and the sedation                            options and risks were discussed with the patient.                            All questions were answered, and informed consent                            was obtained. Prior Anticoagulants: The patient has                            taken no anticoagulant or antiplatelet agents. ASA                            Grade Assessment: II - A patient with mild systemic                            disease. After reviewing the risks and benefits,                            the patient was deemed in satisfactory condition to                            undergo the procedure.  After obtaining informed consent, the colonoscope                            was passed under direct vision. Throughout the                            procedure, the patient's blood pressure, pulse, and                            oxygen saturations were monitored continuously. The                            Olympus CF-HQ190L [id number]) Colonoscope was                            introduced through the  anus and advanced to the the                            cecum, identified by appendiceal orifice and                            ileocecal valve. The colonoscopy was performed                            without difficulty. The patient tolerated the                            procedure well. The quality of the bowel                            preparation was excellent. The ileocecal valve,                            appendiceal orifice, and rectum were photographed. Scope In: 11:20:38 AM Scope Out: 11:34:58 AM Scope Withdrawal Time: 0 hours 9 minutes 37 seconds  Total Procedure Duration: 0 hours 14 minutes 20 seconds  Findings:                 The perianal and digital rectal examinations were                            normal.                           The entire colon appeared normal.                           Non-bleeding internal hemorrhoids were found during                            retroflexion. The hemorrhoids were small. Complications:            No immediate complications. Estimated Blood Loss:     Estimated blood loss: none. Impression:               - The entire examined colon is normal.                           -  Non-bleeding internal hemorrhoids.                           - No specimens collected.                           - The GI Genius (intelligent endoscopy module),                            computer-aided polyp detection system powered by AI                            was utilized to detect colorectal polyps through                            enhanced visualization during colonoscopy. Recommendation:           - Patient has a contact number available for                            emergencies. The signs and symptoms of potential                            delayed complications were discussed with the                            patient. Return to normal activities tomorrow.                            Written discharge instructions were provided to the                             patient.                           - Resume previous diet.                           - Continue present medications.                           - Repeat colonoscopy in 10 years for screening                            purposes.                           - Return to GI clinic PRN. Gerrit Heck, MD 11/14/2022 11:48:05 AM

## 2022-11-14 NOTE — Progress Notes (Signed)
Uneventful anesthetic. Report to pacu rn. Vss. Care resumed by rn. 

## 2022-11-14 NOTE — Progress Notes (Signed)
Vital signs checked by:AS  The medical and surgical history was reviewed and verified with the patient.  

## 2022-11-14 NOTE — Op Note (Signed)
[facility] Patient Name: Brendan Macdonald Procedure Date: 11/14/2022 10:59 AM MRN: [id number] Endoscopist: Gerrit Heck , MD, [id number] Age: 52 Referring MD:  Date of Birth: Jan 16, 1971 Gender: Male Account #: [id number] Procedure:                Upper GI endoscopy Indications:              Dysphagia, Heartburn, Esophageal reflux Medicines:                Monitored Anesthesia Care Procedure:                Pre-Anesthesia Assessment:                           - Prior to the procedure, a History and Physical                            was performed, and patient medications and                            allergies were reviewed. The patient's tolerance of                            previous anesthesia was also reviewed. The risks                            and benefits of the procedure and the sedation                            options and risks were discussed with the patient.                            All questions were answered, and informed consent                            was obtained. Prior Anticoagulants: The patient has                            taken no anticoagulant or antiplatelet agents. ASA                            Grade Assessment: II - A patient with mild systemic                            disease. After reviewing the risks and benefits,                            the patient was deemed in satisfactory condition to                            undergo the procedure.                           After obtaining informed consent, the endoscope was  passed under direct vision. Throughout the                            procedure, the patient's blood pressure, pulse, and                            oxygen saturations were monitored continuously. The                            Olympus Scope [id number] was introduced through the                            mouth, and advanced to the second part of duodenum.                            The upper  GI endoscopy was accomplished without                            difficulty. The patient tolerated the procedure                            well. Scope In: Scope Out: Findings:                 One benign-appearing, intrinsic mild stenosis was                            found 38 cm from the incisors. This stenosis                            measured less than one cm (in length). The stenosis                            was traversed. A TTS dilator was passed through the                            scope. Dilation with an 18-19-20 mm balloon dilator                            was performed to 20 mm without mucosal rent. The                            balloon was reinflated to 20 mm and the entire                            endoscope and balloon were withdrawn proximally for                            discovery and dilation of more proximal strictures.                            The endoscope was reinserted and no mucosal rent or  fractures noted. The distal stricture was then                            fractured using cold forceps (no pathology                            collected). Estimated blood loss was minimal.                           Subtle mucosal changes were found in the middle                            third of the esophagus and in the lower third of                            the esophagus. Esophageal findings were graded                            using the Eosinophilic Esophagitis Endoscopic                            Reference Score (EoE-EREFS) as: Edema Grade 0                            Normal (distinct vascular markings), Rings Grade 0                            None (no ridges or rings seen), Exudates Grade 0                            None (no white lesions seen), Furrows Grade 1 Mild                            (vertical lines without visible depth) and                            Stricture present. Biopsies were obtained from the                             proximal and distal esophagus with cold forceps for                            histology of suspected eosinophilic esophagitis.                            Estimated blood loss was minimal.                           The Z-line was regular and was found 39 cm from the                            incisors.  A 1 cm sliding type hiatal hernia was present.                           The gastroesophageal flap valve was visualized                            endoscopically and classified as Hill Grade III                            (minimal fold, loose to endoscope, hiatal hernia                            likely).                           Segmental minimal inflammation characterized by                            erythema was found in the gastric body and in the                            gastric antrum. Biopsies were taken with a cold                            forceps for Helicobacter pylori testing. Estimated                            blood loss was minimal.                           The examined duodenum was normal. Complications:            No immediate complications. Estimated Blood Loss:     Estimated blood loss was minimal. Impression:               - Benign-appearing esophageal stenosis. Dilated                            with 20 mm TTS balloon then fractured with cold                            forceps as above.                           - Subtle esophageal mucosal changes in the mid and                            lower esophagus. Biopsies were taken with a cold                            forceps for evaluation of eosinophilic esophagitis.                           - Z-line regular, 39 cm from the incisors.                           -  1 cm sliding type hiatal hernia.                           - Gastroesophageal flap valve classified as Hill                            Grade III (minimal fold, loose to endoscope, hiatal                            hernia  likely).                           - Gastritis. Biopsied.                           - Normal examined duodenum. Recommendation:           - Patient has a contact number available for                            emergencies. The signs and symptoms of potential                            delayed complications were discussed with the                            patient. Return to normal activities tomorrow.                            Written discharge instructions were provided to the                            patient.                           - Advance diet as tolerated.                           - Continue present medications.                           - Await pathology results.                           - Repeat upper endoscopy PRN for retreatment.                           - Return to GI clinic PRN.                           - Colonoscopy today. Gerrit Heck, MD 11/14/2022 11:45:40 AM

## 2022-11-17 ENCOUNTER — Telehealth: Payer: Self-pay

## 2022-11-17 ENCOUNTER — Encounter: Payer: 59 | Admitting: Gastroenterology

## 2022-11-17 NOTE — Telephone Encounter (Signed)
Left message on follow up call. 

## 2023-01-02 ENCOUNTER — Encounter (HOSPITAL_COMMUNITY): Payer: Self-pay | Admitting: Emergency Medicine

## 2023-01-02 ENCOUNTER — Other Ambulatory Visit: Payer: Self-pay

## 2023-01-02 ENCOUNTER — Emergency Department (HOSPITAL_COMMUNITY)
Admission: EM | Admit: 2023-01-02 | Discharge: 2023-01-03 | Disposition: A | Discharge status: Home / Self Care | Payer: Managed Care, Other (non HMO) | Attending: Emergency Medicine | Admitting: Emergency Medicine

## 2023-01-02 DIAGNOSIS — Y9289 Other specified places as the place of occurrence of the external cause: Secondary | ICD-10-CM | POA: Diagnosis not present

## 2023-01-02 DIAGNOSIS — Z794 Long term (current) use of insulin: Secondary | ICD-10-CM | POA: Diagnosis not present

## 2023-01-02 DIAGNOSIS — T6591XA Toxic effect of unspecified substance, accidental (unintentional), initial encounter: Secondary | ICD-10-CM | POA: Diagnosis not present

## 2023-01-02 DIAGNOSIS — X04XXXA Exposure to ignition of highly flammable material, initial encounter: Secondary | ICD-10-CM | POA: Diagnosis not present

## 2023-01-02 DIAGNOSIS — T24432A Corrosion of unspecified degree of left lower leg, initial encounter: Secondary | ICD-10-CM | POA: Diagnosis not present

## 2023-01-02 DIAGNOSIS — T24431A Corrosion of unspecified degree of right lower leg, initial encounter: Secondary | ICD-10-CM | POA: Diagnosis not present

## 2023-01-02 DIAGNOSIS — M7989 Other specified soft tissue disorders: Secondary | ICD-10-CM | POA: Diagnosis present

## 2023-01-02 DIAGNOSIS — Y990 Civilian activity done for income or pay: Secondary | ICD-10-CM | POA: Insufficient documentation

## 2023-01-02 DIAGNOSIS — T304 Corrosion of unspecified body region, unspecified degree: Secondary | ICD-10-CM
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
## 2023-01-03 MED ORDER — HYDROCORTISONE 2.5 % EX CREA: TOPICAL_CREAM | Freq: Two times a day (BID) | CUTANEOUS | 0 refills | Status: DC
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
## 2023-01-03 NOTE — Discharge Instructions (Signed)
Begin using hydrocortisone cream as prescribed.  Return to the emergency department if symptoms significantly worsen or change.

## 2023-01-07 NOTE — Progress Notes (Signed)
HPI: [name] is a 52 y.o. male with PMHx significant for HLD, DM II, and allergic rhinitis here today for chronic disease management.  Last seen on 09/02/22  DM 2 diagnosed in 03/2020. Currently he is on Ozempic 0.5 mg weekly and Synjardy XR 25-1000 mg daily. He reports checking his blood sugars periodically throughout the day, continues glucose monitoring, fasting levels typically ranging from 120-160 mg/dL, some 161'W. He is dissatisfied with his current diabetes medication Ozempic due to its "effects on the body", he does not elaborate in this regard but states that he has reviewed side effects and would like to discontinue it.  His last hemoglobin A1c was 7.5 in 08/2022.  Lab Results  Component Value Date   MICROALBUR <0.7 09/02/2022   MICROALBUR <0.7 09/06/2021  Negative for hypoglycemic like symptoms, numbness of lower extremities, polyuria, polydipsia, or foot ulcers/trauma. He acknowledges he is not following dietary recommendations but he tries to be more "mindful" about food intake. He drinks soft drinks with no sugar. He is not exercising regularly. He has not had his eye exam.  -He is experiencing joint pains in his right thumb and shoulders. Right MTP joint pain of right thumb and index finger for the past 6 months.  No hx of trauma, shoulder pain affects his sleep. Hx of right shoulder surgery a few years ago. Lower back pain, L>R,which has been going on for years, not radiated. Intermittent knee pain.  He states that he started dietary supplements like sea moss and bladderwrack for their mineral content.  Review of Systems  Constitutional:  Negative for chills and fever.  HENT:  Negative for mouth sores and sore throat.   Respiratory:  Negative for cough, shortness of breath and wheezing.   Cardiovascular:  Negative for chest pain, palpitations and leg swelling.  Gastrointestinal:  Negative for abdominal pain, nausea and vomiting.   Genitourinary:  Negative for decreased urine volume, dysuria and hematuria.  Skin:  Negative for rash.  Neurological:  Negative for syncope and weakness.  Psychiatric/Behavioral:  Negative for confusion. The patient is nervous/anxious.   See other pertinent positives and negatives in HPI.  Current Outpatient Medications on File Prior to Visit  Medication Sig Dispense Refill   atorvastatin (LIPITOR) 20 MG tablet Take 1 tablet (20 mg total) by mouth daily. 90 tablet 3   Continuous Blood Gluc Receiver (DEXCOM G7 RECEIVER) DEVI 1 Device by Does not apply route as directed. 1 each 1   Continuous Blood Gluc Sensor (DEXCOM G6 SENSOR) MISC Change sensor q 10 days. 3 each 1   Continuous Blood Gluc Sensor (DEXCOM G7 SENSOR) MISC Change sensor q 10 days. 3 each 1   Continuous Blood Gluc Transmit (DEXCOM G6 TRANSMITTER) MISC 1 Device by Does not apply route every 3 (three) months. 1 each 3   Empagliflozin-metFORMIN HCl ER (SYNJARDY XR) 25-1000 MG TB24 Take 1 tablet by mouth daily with breakfast. 90 tablet 2   fexofenadine-pseudoephedrine (ALLEGRA-D 24) 180-240 MG 24 hr tablet Take 1 tablet by mouth daily. 30 tablet 11   hydrocortisone 2.5 % cream Apply topically 2 (two) times daily. 30 g 0   mometasone (NASONEX) 50 MCG/ACT nasal spray Place 2 sprays into the nose daily. 1 each 3   neomycin-polymyxin-hydrocortisone (CORTISPORIN) OTIC solution Place 3 drops into the right ear 4 (four) times daily. X 7 days 10 mL 0   pantoprazole (PROTONIX) 40 MG tablet Take 1 tablet by mouth once daily 90 tablet 2   No  current facility-administered medications on file prior to visit.   Past Medical History:  Diagnosis Date   Allergy    Arthritis    Complication of anesthesia    years ago used to hyperventilate no recent problems    GERD (gastroesophageal reflux disease)    History of chicken pox    Hyperlipidemia    Type 2 diabetes mellitus (HCC)    Allergies  Allergen Reactions   Oxycodone Shortness Of Breath  and Other (See Comments)    Hallucinations   Vicodin [Hydrocodone-Acetaminophen] Shortness Of Breath and Other (See Comments)    Hallucinations   Social History   Socioeconomic History   Marital status: Married    Spouse name: Not on file   Number of children: Not on file   Years of education: Not on file   Highest education level: 12th grade  Occupational History   Not on file  Tobacco Use   Smoking status: Some Days    Packs/day: 0.75    Years: 20.00    Additional pack years: 0.00    Total pack years: 15.00    Types: Cigars, Cigarettes    Last attempt to quit: 08/25/2012    Years since quitting: 10.3   Smokeless tobacco: Former    Types: Snuff    Quit date: 08/26/2015  Vaping Use   Vaping Use: Former   Start date: 04/04/2018  Substance and Sexual Activity   Alcohol use: Yes    Alcohol/week: 3.0 standard drinks of alcohol    Types: 3 Cans of beer per week    Comment: Occasionally   Drug use: No   Sexual activity: Yes    Birth control/protection: Condom  Other Topics Concern   Not on file  Social History Narrative   Not on file   Social Determinants of Health   Financial Resource Strain: Low Risk  (01/09/2023)   Overall Financial Resource Strain (CARDIA)    Difficulty of Paying Living Expenses: Not hard at all  Food Insecurity: No Food Insecurity (01/09/2023)   Hunger Vital Sign    Worried About Running Out of Food in the Last Year: Never true    Ran Out of Food in the Last Year: Never true  Transportation Needs: Patient Declined (01/09/2023)   PRAPARE - Transportation    Lack of Transportation (Medical): Patient declined    Lack of Transportation (Non-Medical): Patient declined  Physical Activity: Unknown (09/30/2021)   Exercise Vital Sign    Days of Exercise per Week: 0 days    Minutes of Exercise per Session: Not on file  Stress: Not on file  Social Connections: Unknown (01/09/2023)   Social Connection and Isolation Panel [NHANES]    Frequency of Communication  with Friends and Family: More than three times a week    Frequency of Social Gatherings with Friends and Family: More than three times a week    Attends Religious Services: More than 4 times per year    Active Member of Clubs or Organizations: Patient declined    Attends Banker Meetings: Not on file    Marital Status: Married   Vitals:   01/09/23 1448  BP: 120/70  Pulse: 95  Resp: 12  Temp: 97.7 F (36.5 C)  SpO2: 98%   Wt Readings from Last 3 Encounters:  01/09/23 273 lb (123.8 kg)  01/02/23 265 lb (120.2 kg)  11/14/22 274 lb (124.3 kg)   Body mass index is 32.37 kg/m.  Physical Exam Vitals and nursing note reviewed.  Constitutional:      General: He is not in acute distress.    Appearance: He is well-developed.  HENT:     Head: Normocephalic and atraumatic.     Mouth/Throat:     Mouth: Mucous membranes are moist.     Pharynx: Oropharynx is clear.  Eyes:     Conjunctiva/sclera: Conjunctivae normal.  Cardiovascular:     Rate and Rhythm: Normal rate and regular rhythm.     Pulses:          Dorsalis pedis pulses are 2+ on the right side and 2+ on the left side.     Heart sounds: No murmur heard. Pulmonary:     Effort: Pulmonary effort is normal. No respiratory distress.     Breath sounds: Normal breath sounds.  Abdominal:     Palpations: Abdomen is soft. There is no hepatomegaly or mass.     Tenderness: There is no abdominal tenderness.  Musculoskeletal:     Right shoulder: No swelling, tenderness, bony tenderness or crepitus.     Comments: Right shoulder range of motion with no significant limitation.  Some pain elicited with abduction above 90 degrees. Right hand with no sings of synovitis, pain is not elicited with palpation or ROM.  Lymphadenopathy:     Cervical: No cervical adenopathy.  Skin:    General: Skin is warm.     Findings: No erythema or rash.  Neurological:     Mental Status: He is alert and oriented to person, place, and time.      Cranial Nerves: No cranial nerve deficit.     Gait: Gait normal.  Psychiatric:        Mood and Affect: Mood and affect normal.   ASSESSMENT AND PLAN:  Mr. Ryel was seen today for medical management of chronic issues, shoulder pain and hand pain.  Diagnoses and all orders for this visit: Lab Results  Component Value Date   HGBA1C 6.9 01/09/2023   Type 2 diabetes mellitus with other specified complication, without long-term current use of insulin (HCC) Assessment & Plan: He is concerned about possible side effects of Ozempic and wants to discontinued. Continue Synjardy XR 25-1000 mg daily.  We discussed other pharmacologic treatment options, including glipizide, Januvia, basal insulin, and Actos.  We review each medication side effects, he is concerned about risk for bladder carcinoma reported with active use (0.5% versus 0.2% compared with placebo).  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 Januvia could aggravate joint pain, side effects reviewed, he would like to try. Continue appropriate food care and periodic dental visits. He is overdue for eye exam.  Orders: -     POCT glycosylated hemoglobin (Hb A1C) -     SITagliptin Phosphate; Take 1 tablet (100 mg total) by mouth daily.  Dispense: 90 tablet; Refill: 1 -     Synjardy XR; Take 1 tablet by mouth daily with breakfast.  Dispense: 90 tablet; Refill: 2  Chronic right shoulder pain Assessment & Plan: We discussed possible etiologies. ? OA, tendinitis. Examination today with no significant abnormalities. We discussed some side effects of chronic NSAID's. I do not think imaging is needed today. He is not interested in PT. Offered referral to sport medicine or ortho, not interested. Tylenol 500 mg 3-4 times per day as needed and ROM exercises.  Arthralgia of right hand ? OA. I do not think imaging is needed needed at  this  time.  Chronic left-sided low back pain without sciatica Assessment & Plan: Stable otherwise. We discussed treatment options. He is not interested in trying Duloxetine.  I spent a total of 46 minutes in both face to face and non face to face activities for this visit on the date of this encounter. During this time history was obtained and documented, examination was performed, prior labs/imaging reviewed, and assessment/plan discussed.  Return in about 5 months (around 06/11/2023) for chronic problems.  Jousha Schwandt G. [place], MD  [facility]. [place] office.

## 2023-01-09 ENCOUNTER — Ambulatory Visit (INDEPENDENT_AMBULATORY_CARE_PROVIDER_SITE_OTHER): Payer: Managed Care, Other (non HMO) | Admitting: Family Medicine

## 2023-01-09 ENCOUNTER — Encounter: Payer: Self-pay | Admitting: Family Medicine

## 2023-01-09 VITALS — BP 120/70 | HR 95 | Temp 97.7°F | Resp 12 | Ht 77.0 in | Wt 273.0 lb

## 2023-01-09 DIAGNOSIS — M25511 Pain in right shoulder: Secondary | ICD-10-CM

## 2023-01-09 DIAGNOSIS — E1169 Type 2 diabetes mellitus with other specified complication: Secondary | ICD-10-CM

## 2023-01-09 DIAGNOSIS — Z7984 Long term (current) use of oral hypoglycemic drugs: Secondary | ICD-10-CM | POA: Diagnosis not present

## 2023-01-09 DIAGNOSIS — M25541 Pain in joints of right hand: Secondary | ICD-10-CM | POA: Diagnosis not present

## 2023-01-09 DIAGNOSIS — G8929 Other chronic pain: Secondary | ICD-10-CM

## 2023-01-09 DIAGNOSIS — M545 Low back pain, unspecified: Secondary | ICD-10-CM

## 2023-01-09 LAB — POCT GLYCOSYLATED HEMOGLOBIN (HGB A1C): HbA1c, POC (controlled diabetic range): 6.9 % (ref 0.0–7.0)

## 2023-01-09 MED ORDER — SYNJARDY XR 25-1000 MG PO TB24
1.0000 | ORAL_TABLET | Freq: Every day | ORAL | 2 refills | Status: AC
Start: 2023-01-09 — End: ?

## 2023-01-09 MED ORDER — SITAGLIPTIN PHOSPHATE 100 MG PO TABS
100.0000 mg | ORAL_TABLET | Freq: Every day | ORAL | 1 refills | Status: AC
Start: 2023-01-09 — End: ?

## 2023-01-09 NOTE — Assessment & Plan Note (Signed)
He is concerned about possible side effects of Ozempic and wants to discontinued. Continue Synjardy XR 25-1000 mg daily.  We discussed other pharmacologic treatment options, including glipizide, Januvia, basal insulin, and Actos.  We review each medication side effects, he is concerned about risk for bladder carcinoma reported with active use (0.5% versus 0.2% compared with placebo).  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 Januvia could aggravate joint pain, side effects reviewed, he would like to try.

## 2023-01-09 NOTE — Patient Instructions (Addendum)
A few things to remember from today's visit:  Type 2 diabetes mellitus with other specified complication, without long-term current use of insulin (HCC) - Plan: POC HgB A1c, sitaGLIPtin (JANUVIA) 100 MG tablet  Chronic right shoulder pain  Arthralgia of right hand  Stop Ozempic and try Januvia. Let me know if you are interested in seeing sport medicine provider for joint pain. Remember to arrange an eye exam.  If you need refills for medications you take chronically, please call your pharmacy. Do not use My Chart to request refills or for acute issues that need immediate attention. If you send a my chart message, it may take a few days to be addressed, specially if I am not in the office.  Please be sure medication list is accurate. If a new problem present, please set up appointment sooner than planned today.

## 2023-01-09 NOTE — Assessment & Plan Note (Signed)
We discussed possible etiologies. ? OA, tendinitis. Examination today with no significant abnormalities. We discussed some side effects of chronic NSAID's. I do not think imaging is needed today. He is not interested in PT. Offered referral to sport medicine or ortho, not interested. Tylenol 500 mg 3-4 times per day as needed and ROM exercises.

## 2023-01-09 NOTE — Assessment & Plan Note (Addendum)
Stable otherwise. We discussed treatment options. He is not interested in trying Duloxetine.

## 2023-04-09 ENCOUNTER — Encounter (INDEPENDENT_AMBULATORY_CARE_PROVIDER_SITE_OTHER): Payer: Self-pay

## 2023-05-14 ENCOUNTER — Telehealth: Payer: Self-pay | Admitting: Family Medicine

## 2023-05-14 DIAGNOSIS — E1169 Type 2 diabetes mellitus with other specified complication: Secondary | ICD-10-CM

## 2023-05-14 NOTE — Telephone Encounter (Signed)
Prescription Request  05/14/2023  LOV: 01/09/2023 has OV 05/22/23  What is the name of the medication or equipment?  Continuous Blood Gluc Sensor (DEXCOM G7 SENSOR) MISC   Have you contacted your pharmacy to request a refill? No   Which pharmacy would you like this sent to?  [facility] [address] [place] - [address] [place] [address] Phone: [phone] Fax: [phone]    Patient notified that their request is being sent to the clinical staff for review and that they should receive a response within 2 business days.   Please advise at Mobile [phone] (mobile)

## 2023-05-15 ENCOUNTER — Ambulatory Visit: Payer: Managed Care, Other (non HMO) | Admitting: Family Medicine

## 2023-05-15 MED ORDER — DEXCOM G7 SENSOR MISC
1 refills | Status: DC
Start: 2023-05-15 — End: 2023-08-03

## 2023-05-15 NOTE — Telephone Encounter (Signed)
Rx sent 

## 2023-05-22 ENCOUNTER — Encounter: Payer: Self-pay | Admitting: Family Medicine

## 2023-05-22 ENCOUNTER — Ambulatory Visit: Payer: Managed Care, Other (non HMO) | Admitting: Family Medicine

## 2023-05-22 VITALS — BP 126/70 | HR 94 | Resp 12 | Ht 77.0 in | Wt 270.4 lb

## 2023-05-22 DIAGNOSIS — M722 Plantar fascial fibromatosis: Secondary | ICD-10-CM | POA: Diagnosis not present

## 2023-05-22 DIAGNOSIS — E1169 Type 2 diabetes mellitus with other specified complication: Secondary | ICD-10-CM

## 2023-05-22 DIAGNOSIS — M25531 Pain in right wrist: Secondary | ICD-10-CM

## 2023-05-22 DIAGNOSIS — Z7984 Long term (current) use of oral hypoglycemic drugs: Secondary | ICD-10-CM

## 2023-05-22 DIAGNOSIS — Z23 Encounter for immunization: Secondary | ICD-10-CM | POA: Diagnosis not present

## 2023-05-22 LAB — POCT GLYCOSYLATED HEMOGLOBIN (HGB A1C): Hemoglobin A1C: 7.7 % — AB (ref 4.0–5.6)

## 2023-05-22 MED ORDER — SITAGLIPTIN PHOSPHATE 100 MG PO TABS
100.0000 mg | ORAL_TABLET | Freq: Every day | ORAL | 1 refills | Status: DC
Start: 2023-05-22 — End: 2023-10-09

## 2023-05-22 MED ORDER — PIOGLITAZONE HCL 15 MG PO TABS
15.0000 mg | ORAL_TABLET | Freq: Every day | ORAL | 1 refills | Status: DC
Start: 2023-05-22 — End: 2023-07-22

## 2023-05-22 NOTE — Patient Instructions (Addendum)
A few things to remember from today's visit:  Type 2 diabetes mellitus with other specified complication, without long-term current use of insulin (HCC) - Plan: POC HgB A1c  Right wrist pain - Plan: Ambulatory referral to Sports Medicine  Plantar fasciitis  Actos 15 mg added today. Podiatrist for left foot pain. Remember to schedule your eye exam.  If you need refills for medications you take chronically, please call your pharmacy. Do not use My Chart to request refills or for acute issues that need immediate attention. If you send a my chart message, it may take a few days to be addressed, specially if I am not in the office.  Please be sure medication list is accurate. If a new problem present, please set up appointment sooner than planned today.

## 2023-05-22 NOTE — Progress Notes (Signed)
HPI: [name] is a 52 y.o. male with PMHx significant for HLD, DM II, and allergic rhinitis, who is here today for chronic disease management.  Last seen on 01/09/23  Diabetes Mellitus II: diagnosed in 03/2020.  - Checking BG at home: He has been monitoring his blood sugars at home and reports readings ranging from 100s-170s. He recalls a time he saw a reading as high as 280 after eating something unhealthy and after 2 hours it will improve to 150s.  - Medications: Currently on Januvia 100 mg daily and Synjardy XR 25-1000 mg daily.  Ozempic d/c because GI side effects. - Diet: He states he has not been eating healthy, and drinks "a lot" of Hawaiian Punch. - Exercise: Has not been exercising regularly.  Eye exam: overdue Foot exam: 08/2022. Negative for symptoms of hypoglycemia, polyuria, polydipsia, numbness extremities, foot ulcers/trauma Previously had intolerances when on ozempic.   HgA1C 6.9 on 01/09/23.  Lab Results  Component Value Date   MICROALBUR <0.7 09/02/2022   -He reports pain in right thumb and index finger for 2 months.  Painful to grab things or use his phone.  States the pain is more intense at the wrist, points anterior radial aspect.  He notes he used to play football when he was younger, and is unsure of whether it could have contributed to this pain.  No numbness, tingling, or any known recent trauma/injuries.  MCP right index pain. No edema, erythema, or limitation of ROM.  -He complains of left heel pain starting 2-3 months ago.  States his foot is "sore to the touch" and hurts at all times but worse in the morning when first gets up and after long walking. He has tried rolling a frozen water bottle on his foot, wearing insoles, icing, and hand held massagers.  He denies redness, swelling, or any new shoes.   Review of Systems  Constitutional:  Negative for appetite change, chills and fever.  HENT:  Negative for mouth sores and sore  throat.   Eyes:  Negative for redness and visual disturbance.  Respiratory:  Negative for cough, shortness of breath and wheezing.   Cardiovascular:  Negative for chest pain, palpitations and leg swelling.  Gastrointestinal:  Negative for abdominal pain, nausea and vomiting.  Genitourinary:  Negative for decreased urine volume, dysuria and hematuria.  Musculoskeletal:  Positive for arthralgias.  Skin:  Negative for rash.  Neurological:  Negative for syncope, weakness and headaches.  See other pertinent positives and negatives in HPI.  Current Outpatient Medications on File Prior to Visit  Medication Sig Dispense Refill   atorvastatin (LIPITOR) 20 MG tablet Take 1 tablet (20 mg total) by mouth daily. 90 tablet 3   Continuous Blood Gluc Receiver (DEXCOM G7 RECEIVER) DEVI 1 Device by Does not apply route as directed. 1 each 1   Continuous Blood Gluc Sensor (DEXCOM G6 SENSOR) MISC Change sensor q 10 days. 3 each 1   Continuous Blood Gluc Transmit (DEXCOM G6 TRANSMITTER) MISC 1 Device by Does not apply route every 3 (three) months. 1 each 3   Continuous Glucose Sensor (DEXCOM G7 SENSOR) MISC Change sensor q 10 days. 3 each 1   Empagliflozin-metFORMIN HCl ER (SYNJARDY XR) 25-1000 MG TB24 Take 1 tablet by mouth daily with breakfast. 90 tablet 2   fexofenadine-pseudoephedrine (ALLEGRA-D 24) 180-240 MG 24 hr tablet Take 1 tablet by mouth daily. 30 tablet 11   hydrocortisone 2.5 % cream Apply topically 2 (two) times daily. 30 g  0   mometasone (NASONEX) 50 MCG/ACT nasal spray Place 2 sprays into the nose daily. 1 each 3   pantoprazole (PROTONIX) 40 MG tablet Take 1 tablet by mouth once daily 90 tablet 2   sitaGLIPtin (JANUVIA) 100 MG tablet Take 1 tablet (100 mg total) by mouth daily. 90 tablet 1   No current facility-administered medications on file prior to visit.    Past Medical History:  Diagnosis Date   Allergy    Arthritis    Complication of anesthesia    years ago used to hyperventilate  no recent problems    GERD (gastroesophageal reflux disease)    History of chicken pox    Hyperlipidemia    Type 2 diabetes mellitus (HCC)    Allergies  Allergen Reactions   Oxycodone Shortness Of Breath and Other (See Comments)    Hallucinations   Vicodin [Hydrocodone-Acetaminophen] Shortness Of Breath and Other (See Comments)    Hallucinations    Social History   Socioeconomic History   Marital status: Married    Spouse name: Not on file   Number of children: Not on file   Years of education: Not on file   Highest education level: 12th grade  Occupational History   Not on file  Tobacco Use   Smoking status: Some Days    Current packs/day: 0.00    Average packs/day: 0.8 packs/day for 20.0 years (15.0 ttl pk-yrs)    Types: Cigars, Cigarettes    Start date: 08/25/1992    Last attempt to quit: 08/25/2012    Years since quitting: 10.7   Smokeless tobacco: Former    Types: Snuff    Quit date: 08/26/2015   Tobacco comments:    Occasional cigar, 1 every 2 months.  Vaping Use   Vaping status: Former   Start date: 04/04/2018  Substance and Sexual Activity   Alcohol use: Yes    Alcohol/week: 3.0 standard drinks of alcohol    Types: 3 Cans of beer per week    Comment: Occasionally   Drug use: No   Sexual activity: Yes    Birth control/protection: Condom  Other Topics Concern   Not on file  Social History Narrative   Not on file   Social Determinants of Health   Financial Resource Strain: Low Risk  (01/09/2023)   Overall Financial Resource Strain (CARDIA)    Difficulty of Paying Living Expenses: Not hard at all  Food Insecurity: No Food Insecurity (01/09/2023)   Hunger Vital Sign    Worried About Running Out of Food in the Last Year: Never true    Ran Out of Food in the Last Year: Never true  Transportation Needs: Patient Declined (01/09/2023)   PRAPARE - Transportation    Lack of Transportation (Medical): Patient declined    Lack of Transportation (Non-Medical): Patient  declined  Physical Activity: Unknown (09/30/2021)   Exercise Vital Sign    Days of Exercise per Week: 0 days    Minutes of Exercise per Session: Not on file  Stress: Not on file  Social Connections: Unknown (01/09/2023)   Social Connection and Isolation Panel [NHANES]    Frequency of Communication with Friends and Family: More than three times a week    Frequency of Social Gatherings with Friends and Family: More than three times a week    Attends Religious Services: More than 4 times per year    Active Member of [employer] or Organizations: Patient declined    Attends Banker Meetings: Not on file  Marital Status: Married   Vitals:   05/22/23 1515  BP: 126/70  Pulse: 94  Resp: 12  SpO2: 99%   Body mass index is 32.06 kg/m.  Physical Exam Vitals and nursing note reviewed.  Constitutional:      General: He is not in acute distress.    Appearance: He is well-developed.  HENT:     Head: Normocephalic and atraumatic.  Eyes:     Conjunctiva/sclera: Conjunctivae normal.  Cardiovascular:     Rate and Rhythm: Normal rate and regular rhythm.     Pulses:          Dorsalis pedis pulses are 2+ on the right side and 2+ on the left side.     Heart sounds: No murmur heard. Pulmonary:     Effort: Pulmonary effort is normal. No respiratory distress.     Breath sounds: Normal breath sounds.  Abdominal:     Palpations: Abdomen is soft. There is no hepatomegaly or mass.     Tenderness: There is no abdominal tenderness.  Musculoskeletal:     Comments: Left foot:Tenderness upon palpation of heel mainly at the medial insertion of plantar fascia into calcaneous. Also mild discomfort with palpation along planta fascia towards forefoot. No bunion  Dorsal flexion of first MTP elicits pain. No edema or erythema appreciated on area.  Right hand with mild tenderness upon palpation volar aspect of wrist under radial styloid, no edema or erythema appreciated, no limitation of wrist ROM.  Minimal pain also elicited on radial styloid with Finkelstein maneuver.  Skin:    General: Skin is warm.     Findings: No erythema or rash.  Neurological:     Mental Status: He is alert and oriented to person, place, and time.     Cranial Nerves: No cranial nerve deficit.     Gait: Gait normal.  Psychiatric:        Mood and Affect: Mood and affect normal.    ASSESSMENT AND PLAN: Mr. Brendan Macdonald. was seen today for management of chronic conditions.  Orders Placed This Encounter  Procedures   Flu vaccine trivalent PF, 6mos and older(Flulaval,Afluria,Fluarix,Fluzone)   Ambulatory referral to Sports Medicine   POC HgB A1c    Type 2 diabetes mellitus with other specified complication, without long-term current use of insulin (HCC) Assessment & Plan: Problem is not well controlled, HgA1C went from 6.9 to 7.7. Currently on Januvia 100 mg daily and Synjardy XR 25-1000 mg daily.  He agrees with adding Actos 15 mg daily. Regular exercise and healthy diet with avoidance of added sugar food intake is an important part of treatment and recommended. Annual eye exam, periodic dental and foot care recommended. F/U in 4 months.  Orders: -     POCT glycosylated hemoglobin (Hb A1C) -     Pioglitazone HCl; Take 1 tablet (15 mg total) by mouth daily.  Dispense: 90 tablet; Refill: 1  Right wrist pain We discussed possible etiologies. ? Tenosynovitic, OA among some to consider. It has been going on for 2 months, we discussed treatment options, including wrist splint with thumb support. He prefers referral to specialist, sport medicine referral placed.  -     Ambulatory referral to Sports Medicine  Plantar fasciitis We discussed dx and treatment options. He has tried different treatments, including shoe inserts, so recommend consultation with podiatrist.  Need for influenza vaccination -     Flu vaccine trivalent PF, 6mos and older(Flulaval,Afluria,Fluarix,Fluzone)  Return in  about 4 months (around  09/21/2023) for chronic problems.  I,Rachel Rivera,acting as a scribe for Yamilette Garretson [place], MD.,have documented all relevant documentation on the behalf of Halley Kincer [place], MD,as directed by  Minda Faas [place], MD while in the presence of Chattie Greeson [place], MD.  I, Zacari Radick [place], MD, have reviewed all documentation for this visit. The documentation on 05/22/23 for the exam, diagnosis, procedures, and orders are all accurate and complete.  Conor Filsaime G. [place], MD  [facility]. [place] office.

## 2023-05-22 NOTE — Assessment & Plan Note (Addendum)
Problem is not well controlled, HgA1C went from 6.9 to 7.7. Currently on Januvia 100 mg daily and Synjardy XR 25-1000 mg daily.  He agrees with adding Actos 15 mg daily. Regular exercise and healthy diet with avoidance of added sugar food intake is an important part of treatment and recommended. Annual eye exam, periodic dental and foot care recommended. F/U in 4 months.

## 2023-05-26 ENCOUNTER — Other Ambulatory Visit: Payer: Self-pay

## 2023-05-26 ENCOUNTER — Ambulatory Visit (INDEPENDENT_AMBULATORY_CARE_PROVIDER_SITE_OTHER): Payer: Managed Care, Other (non HMO)

## 2023-05-26 ENCOUNTER — Ambulatory Visit (INDEPENDENT_AMBULATORY_CARE_PROVIDER_SITE_OTHER): Payer: Managed Care, Other (non HMO) | Admitting: Family Medicine

## 2023-05-26 VITALS — BP 122/82 | HR 88 | Ht 77.0 in | Wt 273.0 lb

## 2023-05-26 DIAGNOSIS — M79672 Pain in left foot: Secondary | ICD-10-CM

## 2023-05-26 DIAGNOSIS — G8929 Other chronic pain: Secondary | ICD-10-CM | POA: Diagnosis not present

## 2023-05-26 DIAGNOSIS — M79641 Pain in right hand: Secondary | ICD-10-CM

## 2023-05-26 NOTE — Progress Notes (Signed)
Rubin Payor, PhD, LAT, ATC acting as a scribe for Brendan Graham, MD.  Brendan Macdonald. is a 52 y.o. male who presents to [facility] at [facility] today for L heel and R hand pain  L heel pain ongoing for 2-3 months. No injury. He works as a Naval architect. Pt locates pain to the plantar aspect of his L heel, but also medially and laterally. Significant increased pain first thing in the mornings. He tried ice massage, but that made the pain worse.   Aggravates: worse 1st thing in the mornings, prolonged walking,  Treatments tried: ice massage, insoles, massage gun,   He also c/o R wrist/hand pain that's chronic in nature. Pt locates pain to the R 2nd MCP and radial aspect of the R thumb.   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   Exam:  BP 122/82   Pulse 88   Ht 6\' 5"  (1.956 m)   Wt 273 lb (123.8 kg)   SpO2 97%   BMI 32.37 kg/m  General: Well Developed, well nourished, and in no acute distress.   MSK: Right hand some bossing at first [facility] otherwise normal.  Normal hand motion.  Mildly tender palpation first CMC and second MCP.  Strength and motion are intact. Pulses and capillary fill are intact.  Left foot: Pes planus is present.  Otherwise normal-appearing. Normal foot and ankle motion. Tender palpation plantar calcaneus.    Lab and Radiology Results  [facility] MSK Ultrasound of: Right hand First [facility] joint has degenerative changes including bone spurs and mild joint effusion. Second MCP mild joint effusion no bone spurs are present. Impression: DJD especially first CMC and second MCP for visible.  X-ray images right hand and left heel obtained today personally and independently  interpreted  Right hand: Moderate first CMC DJD.  Mild metacarpal DJD.  No acute fractures are visible.  No erosions are present.  Left calcaneus: Early appearing calcaneus without fracture or significant abnormality.  Await formal radiology review   Assessment and Plan: 52 y.o. male with chronic left heel pain.  This problem has recurred off and on for several months now worsening recently.  Pain is most consistent with plantar fasciitis.  Plan to maximize conservative management.  He is already doing a good job with heel cushioning.  Will add eccentric exercises ice immersion in the evenings and night splints.  If not improving in a month consider steroid injection.  We talked about custom orthotics I think given conservative management a bit more time is reasonable.  Will try to get an idea of price at the local podiatry office.  Right hand pain thought to be predominantly DJD of the first [facility] and some at the second MCP.  He would be a great candidate for occupational therapy although his schedule will not allow that very easily.  Plan for home exercise program and Voltaren gel.  Also recommend heat and a CMC brace.  If not improving consider injection or formal OT.   PDMP not reviewed this encounter. Orders Placed This Encounter  Procedures   [place] LIMITED JOINT SPACE STRUCTURES UP RIGHT(NO LINKED CHARGES)    Order Specific Question:   Reason for Exam (SYMPTOM  OR DIAGNOSIS REQUIRED)    Answer:  right hand pain    Order Specific Question:   Preferred imaging location?    Answer:   [facility]    Standing Status:   Future    Standing Expiration Date:   05/25/2024    Order Specific Question:   Reason for Exam (SYMPTOM  OR DIAGNOSIS REQUIRED)    Answer:   right heel pain    Order Specific Question:   Preferred imaging location?    Answer:   [facility]   DG Hand Complete Right    Standing Status:   Future    Standing Expiration  Date:   06/26/2023    Order Specific Question:   Reason for Exam (SYMPTOM  OR DIAGNOSIS REQUIRED)    Answer:   right hand pain    Order Specific Question:   Preferred imaging location?    Answer:   [facility]   No orders of the defined types were placed in this encounter.    Discussed warning signs or symptoms. Please see discharge instructions. Patient expresses understanding.   The above documentation has been reviewed and is accurate and complete Brendan Macdonald, M.D.

## 2023-05-26 NOTE — Patient Instructions (Addendum)
Thank you for coming in today.   Please get an Xray today before you leave   Try using a heating pad  Please use Voltaren gel (Generic Diclofenac Gel) up to 4x daily for pain as needed.  This is available over-the-counter as both the name brand Voltaren gel and the generic diclofenac gel.  Please complete the exercises that the athletic trainer went over with you:  View at [email or web address]-exercise-[email or web address] using code: WUJW119  Night splint  Ice immersion  Check back in 1 month

## 2023-05-28 NOTE — Progress Notes (Signed)
Right hand x-ray shows some mild arthritis at the base of the thumb and at the little knuckles of the end of the fingers especially at the index finger and little finger.

## 2023-05-28 NOTE — Progress Notes (Signed)
Left heel x-ray shows a little bit of evidence of chronic Achilles tendinitis but otherwise looks okay on x-ray.

## 2023-06-23 ENCOUNTER — Ambulatory Visit: Payer: Managed Care, Other (non HMO) | Admitting: Family Medicine

## 2023-07-22 ENCOUNTER — Telehealth (INDEPENDENT_AMBULATORY_CARE_PROVIDER_SITE_OTHER): Payer: Managed Care, Other (non HMO) | Admitting: Family Medicine

## 2023-07-22 ENCOUNTER — Encounter: Payer: Self-pay | Admitting: Family Medicine

## 2023-07-22 VITALS — Ht 77.0 in

## 2023-07-22 DIAGNOSIS — E1169 Type 2 diabetes mellitus with other specified complication: Secondary | ICD-10-CM | POA: Diagnosis not present

## 2023-07-22 DIAGNOSIS — R42 Dizziness and giddiness: Secondary | ICD-10-CM

## 2023-07-22 DIAGNOSIS — Z7984 Long term (current) use of oral hypoglycemic drugs: Secondary | ICD-10-CM | POA: Diagnosis not present

## 2023-07-22 NOTE — Progress Notes (Signed)
Virtual Visit via Video Note I connected with Brendan Macdonald. on 07/22/23 by a video enabled telemedicine application and verified that I am speaking with the correct person using two identifiers. Location patient: in his truck Location provider:work office Persons participating in the virtual visit: patient, provider  I discussed the limitations of evaluation and management by telemedicine and the availability of in person appointments. The patient expressed understanding and agreed to proceed.  Chief Complaint  Patient presents with   Dizziness   Headache   HPI: Brendan Macdonald. is a 52 y.o. male with a PMHx significant for HLD, DM II, and allergic rhinitis, who is being seen on video today for dizziness and headache.    Patient complains of dizziness spells and a mild "dull feeling" on parietal area that started after his last visit, 05/22/23, he attributes symptoms to new medication, Actos.  He states that he stopped Actos last week and both the dizziness and "dull feeling" resolved. Dizziness he describes as movement sensation, exacerbated by rapid position changes. No recent URI.  He denies associated fever, chills, visual changes, chest pain, SOB, palpitations, abdominal pain,nausea,vomiting, changes in bowel habits, urinary symptoms,focal weakness,or rash.  DM II has not been well controlled. He is also on Januvia 100 mg daily and synjardy 25-1000 mg daily. He has been checking his BG and says his readings have been in the 130s normally. The highest numbers he has seen have been in the 220s immediately after meals.  He mentions some 70-80's usually when he skips meals.  - Diet: He has not been consistent with following dietary recommendations. - Negative for symptoms of hypoglycemia, polyuria, polydipsia, numbness extremities, foot ulcers/trauma  Lab Results  Component Value Date   HGBA1C 7.7 (A) 05/22/2023    ROS: See pertinent positives and negatives  per HPI.  Past Medical History:  Diagnosis Date   Allergy    Arthritis    Complication of anesthesia    years ago used to hyperventilate no recent problems    GERD (gastroesophageal reflux disease)    History of chicken pox    Hyperlipidemia    Type 2 diabetes mellitus (HCC)     Past Surgical History:  Procedure Laterality Date   Arthroscopy  Bilateral    Knees   BACK SURGERY     CHOLECYSTECTOMY N/A 02/24/2013   Procedure: LAPAROSCOPIC CHOLECYSTECTOMY;  Surgeon: Dalia Heading, MD;  Location: [facility];  Service: General;  Laterality: N/A;   EXAM UNDER ANESTHESIA WITH MANIPULATION OF KNEE Right 03/23/2015   Procedure: RIGHT EXAM UNDER ANESTHESIA WITH MANIPULATION UNDER ANESTHESIA;  Surgeon: Jene Every, MD;  Location: [facility];  Service: Orthopedics;  Laterality: Right;   KNEE ARTHROSCOPY WITH PATELLAR TENDON REPAIR Right 03/23/2015   Procedure: RIGHT KNEE ARTHROSCOPY WITH DEBRIDEMENT;  Surgeon: Jene Every, MD;  Location: [facility];  Service: Orthopedics;  Laterality: Right;   LYSIS OF ADHESION Right 03/23/2015   Procedure: LYSIS OF SCAR TISSUE;  Surgeon: Jene Every, MD;  Location: [facility];  Service: Orthopedics;  Laterality: Right;   ORTHOPEDIC SURGERY     left knee quad tendon tear and patella tear    QUADRICEPS TENDON REPAIR Right 10/18/2014   Procedure: RIGHT QUADRICEP TENDON REPAIR ;  Surgeon: Javier Docker, MD;  Location: [facility];  Service: Orthopedics;  Laterality: Right;   ROTATOR CUFF REPAIR Right     Family History  Problem Relation Age of Onset   Cancer Mother  ovarian and abdomen   Diabetes Mother    Hypertension Mother    Arthritis Mother    Heart disease Father    Hypertension Father    Cancer Father        kidney and bladder    Arthritis Father    Hearing loss Father    Kidney disease Father    Arthritis Sister    Arthritis Sister    Lupus Sister    Stroke Maternal Grandmother    Stroke Paternal Grandmother    Colon cancer Neg Hx    Esophageal  cancer Neg Hx    Stomach cancer Neg Hx    Rectal cancer Neg Hx     Social History   Socioeconomic History   Marital status: Married    Spouse name: Not on file   Number of children: Not on file   Years of education: Not on file   Highest education level: 12th grade  Occupational History   Not on file  Tobacco Use   Smoking status: Some Days    Current packs/day: 0.00    Average packs/day: 0.8 packs/day for 20.0 years (15.0 ttl pk-yrs)    Types: Cigars, Cigarettes    Start date: 08/25/1992    Last attempt to quit: 08/25/2012    Years since quitting: 10.9   Smokeless tobacco: Former    Types: Snuff    Quit date: 08/26/2015   Tobacco comments:    Occasional cigar, 1 every 2 months.  Vaping Use   Vaping status: Former   Start date: 04/04/2018  Substance and Sexual Activity   Alcohol use: Yes    Alcohol/week: 3.0 standard drinks of alcohol    Types: 3 Cans of beer per week    Comment: Occasionally   Drug use: No   Sexual activity: Yes    Birth control/protection: Condom  Other Topics Concern   Not on file  Social History Narrative   Not on file   Social Determinants of Health   Financial Resource Strain: Low Risk  (01/09/2023)   Overall Financial Resource Strain (CARDIA)    Difficulty of Paying Living Expenses: Not hard at all  Food Insecurity: No Food Insecurity (01/09/2023)   Hunger Vital Sign    Worried About Running Out of Food in the Last Year: Never true    Ran Out of Food in the Last Year: Never true  Transportation Needs: Patient Declined (01/09/2023)   PRAPARE - Transportation    Lack of Transportation (Medical): Patient declined    Lack of Transportation (Non-Medical): Patient declined  Physical Activity: Unknown (09/30/2021)   Exercise Vital Sign    Days of Exercise per Week: 0 days    Minutes of Exercise per Session: Not on file  Stress: Not on file  Social Connections: Unknown (01/09/2023)   Social Connection and Isolation Panel [NHANES]    Frequency of  Communication with Friends and Family: More than three times a week    Frequency of Social Gatherings with Friends and Family: More than three times a week    Attends Religious Services: More than 4 times per year    Active Member of [employer] or Organizations: Patient declined    Attends Engineer, structural: Not on file    Marital Status: Married  Catering manager Violence: Not on file     Current Outpatient Medications:    atorvastatin (LIPITOR) 20 MG tablet, Take 1 tablet (20 mg total) by mouth daily., Disp: 90 tablet, Rfl: 3   Continuous  Blood Gluc Receiver (DEXCOM G7 RECEIVER) DEVI, 1 Device by Does not apply route as directed., Disp: 1 each, Rfl: 1   Continuous Blood Gluc Sensor (DEXCOM G6 SENSOR) MISC, Change sensor q 10 days., Disp: 3 each, Rfl: 1   Continuous Blood Gluc Transmit (DEXCOM G6 TRANSMITTER) MISC, 1 Device by Does not apply route every 3 (three) months., Disp: 1 each, Rfl: 3   Continuous Glucose Sensor (DEXCOM G7 SENSOR) MISC, Change sensor q 10 days., Disp: 3 each, Rfl: 1   Empagliflozin-metFORMIN HCl ER (SYNJARDY XR) 25-1000 MG TB24, Take 1 tablet by mouth daily with breakfast., Disp: 90 tablet, Rfl: 2   fexofenadine-pseudoephedrine (ALLEGRA-D 24) 180-240 MG 24 hr tablet, Take 1 tablet by mouth daily., Disp: 30 tablet, Rfl: 11   hydrocortisone 2.5 % cream, Apply topically 2 (two) times daily., Disp: 30 g, Rfl: 0   mometasone (NASONEX) 50 MCG/ACT nasal spray, Place 2 sprays into the nose daily., Disp: 1 each, Rfl: 3   pantoprazole (PROTONIX) 40 MG tablet, Take 1 tablet by mouth once daily, Disp: 90 tablet, Rfl: 2   pioglitazone (ACTOS) 15 MG tablet, Take 1 tablet (15 mg total) by mouth daily., Disp: 90 tablet, Rfl: 1   sitaGLIPtin (JANUVIA) 100 MG tablet, Take 1 tablet (100 mg total) by mouth daily., Disp: 90 tablet, Rfl: 1  EXAM:  VITALS per patient if applicable:Ht 6\' 5"  (1.956 m)   BMI 32.37 kg/m   GENERAL: alert, oriented, appears well and in no acute  distress  HEENT: atraumatic, conjunctiva clear, no obvious abnormalities on inspection of external nose and ears  NECK: normal movements of the head and neck  LUNGS: on inspection no signs of respiratory distress, breathing rate appears normal, no obvious gross SOB, gasping or wheezing  CV: no obvious cyanosis  MS: moves all visible extremities without noticeable abnormality  PSYCH/NEURO: pleasant and cooperative, no obvious depression or anxiety, speech and thought processing grossly intact  ASSESSMENT AND PLAN:  Discussed the following assessment and plan:  Type 2 diabetes mellitus with other specified complication, without long-term current use of insulin (HCC) Problem has not been well controlled, we discussed possible complications. Mild parietal headache and dizziness after he started Actos and resolved after discontinuing medication. He is not willing to try again. 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 We discussed other options like Glipizide but sometimes he skips meals and there is a risk of hypoglycemias. He prefers to continue with  Januvia 100 mg daily and synjardy 25-1000 mg daily and promising to do better with following dietary recommendations.  Dizziness And parietal mild dull headache, both resolved after stopping Actios.Marland Kitchen Hx does not suggest a serious process, ? Vertigo. We discussed possible etiologies.  Instructed about warning signs.  We discussed possible serious and likely etiologies, options for evaluation and workup, limitations of telemedicine visit vs in person visit, treatment, treatment risks and precautions. The patient was advised to call back or seek an in-person evaluation if the symptoms worsen or if the condition fails to improve as anticipated. I discussed the assessment and treatment plan with the patient. The patient was provided an opportunity to ask questions and all were answered. The patient  agreed with the plan and demonstrated an understanding of the instructions.  I, Suanne Marker, acting as a scribe for Whitten Andreoni [place], MD., have documented all relevant documentation on the behalf of Alyxandra Tenbrink [place], MD, as directed by  Betsi Crespi [place], MD while  in the presence of Deryck Hippler [place], MD.   I, Tamicka Shimon [place], MD, have reviewed all documentation for this visit. The documentation on 07/22/23 for the exam, diagnosis, procedures, and orders are all accurate and complete.  Return if symptoms worsen or fail to improve, for keep next appointment.  Lilliane Sposito [place], MD

## 2023-08-03 ENCOUNTER — Other Ambulatory Visit: Payer: Self-pay | Admitting: Family Medicine

## 2023-08-03 DIAGNOSIS — E1169 Type 2 diabetes mellitus with other specified complication: Secondary | ICD-10-CM

## 2023-08-03 DIAGNOSIS — K219 Gastro-esophageal reflux disease without esophagitis: Secondary | ICD-10-CM

## 2023-08-18 ENCOUNTER — Telehealth: Payer: Self-pay

## 2023-08-18 ENCOUNTER — Ambulatory Visit: Payer: Self-pay | Admitting: Family Medicine

## 2023-08-18 NOTE — Telephone Encounter (Signed)
Copied from CRM [id number]. Topic: Clinical - Pink Word Triage >> Aug 18, 2023 11:07 AM Corin V wrote: Reason for Triage: Patient switched off the [place] and started on "pioglitazone" by Dr. [place]. Blood sugar was over 300 this morning and was 276 at time of call. Patinet was unsure if he needs to switch medications or increase dose or how to proceed.   Chief Complaint: High blood sugar Symptoms: Slight dry mouth, patient reports feeling tired but believes that is due to waking up at 1:30 am Frequency: Constant today Pertinent Negatives: Patient denies dizziness, headache, nausea, vomiting  Disposition: [] ED /[] Urgent Care (no appt availability in office) / [x] Appointment(In office/virtual)/ []  [facility]/ [x] Home Care/ [] Refused Recommended Disposition /[] [facility]/ []  Follow-up with PCP Additional Notes: Patient reports changing medication from Januvia to Pioglitazone last week. He reports that today his blood sugar was 300 (250 at the time of the call) and was unsure if he needed to switch medication or take an extra dose. I advised the patient that this matter has been sent to his doctor for review but that the office is closed and he may not hear back until Thursday at the earliest due to the holidays. He is agreeable with this. Patient advised on home care and to call back if he has any new or worsening symptoms and that he can follow up with Urgent Care or the ED if his symptoms worsen and he is unable to reach [place] due to the holiday. He understood and is agreeable with this plan.    Reason for Disposition  [1] Blood glucose 240 - 300 mg/dL (04.5 - 40.9 mmol/L) AND [2] does not  use insulin (e.g., not insulin-dependent; most people with type 2 diabetes)  Answer Assessment - Initial Assessment Questions 1. BLOOD GLUCOSE: "What is your blood glucose level?"      250 2. ONSET: "When did you check the blood glucose?"     Now 3. USUAL RANGE: "What is your glucose level  usually?" (e.g., usual fasting morning value, usual evening value)     100-150 4. KETONES: "Do you check for ketones (urine or blood test strips)?" If Yes, ask: "What does the test show now?"      No 5. TYPE 1 or 2:  "Do you know what type of diabetes you have?"  (e.g., Type 1, Type 2, Gestational; doesn't know)      Type 2 6. INSULIN: "Do you take insulin?" "What type of insulin(s) do you use? What is the mode of delivery? (syringe, pen; injection or pump)?"      No 7. DIABETES PILLS: "Do you take any pills for your diabetes?" If Yes, ask: "Have you missed taking any pills recently?"     Pioglitazone 8. OTHER SYMPTOMS: "Do you have any symptoms?" (e.g., fever, frequent urination, difficulty breathing, dizziness, weakness, vomiting)     "I feel tired but I woke up early today" slight dry mouth  Protocols used: Diabetes - High Blood Sugar-A-AH

## 2023-08-18 NOTE — Telephone Encounter (Signed)
Copied from CRM [id number]. Topic: Clinical - Medical Advice >> Aug 18, 2023 11:01 AM Corin V wrote: Reason for CRM: Patient switched off the [place] and started on "pioglitazone" by Dr. [place]. Blood sugar was over 300 this morning and was 276 at time of call. Patinet was unsure if he needs to switch medications or increase dose or how to proceed.

## 2023-08-18 NOTE — Telephone Encounter (Signed)
I called and spoke with patient. He is aware of message below & verbalized understanding.  ?

## 2023-08-18 NOTE — Telephone Encounter (Signed)
Last visit he told me he did not want to continue Actos. He can continue with  Januvia 100 mg daily,Actos 15 mg daily, and synjardy 25-1000 mg daily. He has not tolerated other medications, the next step if HgA1C is not at goal, will be insulin. Thanks, BJ

## 2023-09-10 ENCOUNTER — Other Ambulatory Visit: Payer: Self-pay | Admitting: Family Medicine

## 2023-09-10 DIAGNOSIS — E1169 Type 2 diabetes mellitus with other specified complication: Secondary | ICD-10-CM

## 2023-09-21 ENCOUNTER — Encounter: Payer: Self-pay | Admitting: Family Medicine

## 2023-09-21 MED ORDER — FREESTYLE LIBRE 3 PLUS SENSOR MISC: 2 refills | Status: DC

## 2023-09-21 MED ORDER — FREESTYLE LIBRE 3 READER DEVI: 0 refills | Status: DC

## 2023-10-02 ENCOUNTER — Ambulatory Visit: Payer: Managed Care, Other (non HMO) | Admitting: Family Medicine

## 2023-10-09 ENCOUNTER — Ambulatory Visit (INDEPENDENT_AMBULATORY_CARE_PROVIDER_SITE_OTHER): Payer: Managed Care, Other (non HMO) | Admitting: Family Medicine

## 2023-10-09 VITALS — BP 120/70 | HR 94 | Resp 16 | Ht 77.0 in | Wt 280.0 lb

## 2023-10-09 DIAGNOSIS — R7989 Other specified abnormal findings of blood chemistry: Secondary | ICD-10-CM

## 2023-10-09 DIAGNOSIS — E1169 Type 2 diabetes mellitus with other specified complication: Secondary | ICD-10-CM

## 2023-10-09 DIAGNOSIS — Z0189 Encounter for other specified special examinations: Secondary | ICD-10-CM

## 2023-10-09 DIAGNOSIS — Z7985 Long-term (current) use of injectable non-insulin antidiabetic drugs: Secondary | ICD-10-CM

## 2023-10-09 DIAGNOSIS — E785 Hyperlipidemia, unspecified: Secondary | ICD-10-CM

## 2023-10-09 LAB — POCT GLYCOSYLATED HEMOGLOBIN (HGB A1C): Hemoglobin A1C: 7.4 % — AB (ref 4.0–5.6)

## 2023-10-09 MED ORDER — TIRZEPATIDE 2.5 MG/0.5ML ~~LOC~~ SOAJ: 2.5000 mg | SUBCUTANEOUS | 0 refills | Status: DC

## 2023-10-09 NOTE — Progress Notes (Signed)
HPI: Brendan Macdonald. is a 53 y.o. male with a PMHx significant for HLD, DM II, and allergic rhinitis, who is here today for chronic disease management.  Last seen on 05/22/2023  Diabetes Mellitus JY:[id number] in 03/2020.  - Checking BG at home: He says his blood sugars are between 100-150.  - Currently on Synjardy 25-1000 mg daily, Januvia 100 mg daily, and Actos 15 mg daily.  - Diet: He says his diet is not good. 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  - eye exam: He says he hasn't had an eye exam for about 5 years.  - foot exam: Performed in 04/2023.  - Negative for symptoms of hypoglycemia, polyuria, polydipsia, numbness extremities, foot ulcers/trauma  Lab Results  Component Value Date   HGBA1C 7.7 (A) 05/22/2023   Lab Results  Component Value Date   MICROALBUR <0.7 09/02/2022   Hyperlipidemia: Currently on atorvastatin 20 mg daily.  Side effects from medication: none Lab Results  Component Value Date   CHOL 182 09/02/2022   HDL 53.40 09/02/2022   LDLCALC 102 (H) 09/02/2022   TRIG 133.0 09/02/2022   CHOLHDL 3 09/02/2022   He is sleeping 5-6 hours per night, but he starts work at [employer].   Review of Systems  Constitutional:  Positive for fatigue. Negative for activity change, appetite change and fever.  HENT:  Negative for nosebleeds and sore throat.   Eyes:  Negative for redness and visual disturbance.  Respiratory:  Negative for cough and shortness of breath.   Cardiovascular:  Negative for chest pain, palpitations and leg swelling.  Gastrointestinal:  Negative for abdominal pain, nausea and vomiting.  Genitourinary:  Negative for decreased urine volume, dysuria and hematuria.  Musculoskeletal:  Positive for arthralgias.       Pain in his left thumb and wrist. He has previously had similar problems on the right side. Follows with sport medicine.  Skin:  Negative for rash.  Neurological:  Negative for syncope,  weakness and headaches.  See other pertinent positives and negatives in HPI.  Current Outpatient Medications on File Prior to Visit  Medication Sig Dispense Refill   atorvastatin (LIPITOR) 20 MG tablet Take 1 tablet by mouth once daily 90 tablet 2   Continuous Blood Gluc Receiver (DEXCOM G7 RECEIVER) DEVI 1 Device by Does not apply route as directed. 1 each 1   Continuous Blood Gluc Sensor (DEXCOM G6 SENSOR) MISC Change sensor q 10 days. 3 each 1   Continuous Blood Gluc Transmit (DEXCOM G6 TRANSMITTER) MISC 1 Device by Does not apply route every 3 (three) months. 1 each 3   Continuous Glucose Receiver (FREESTYLE LIBRE 3 READER) DEVI Use to check blood sugar continuously. 1 each 0   Continuous Glucose Sensor (DEXCOM G7 SENSOR) MISC CHANGE SENSOR EVERY 10 DAYS 3 each 5   Continuous Glucose Sensor (FREESTYLE LIBRE 3 PLUS SENSOR) MISC Change sensor every 15 days. 3 each 2   Empagliflozin-metFORMIN HCl ER (SYNJARDY XR) 25-1000 MG TB24 Take 1 tablet by mouth daily with breakfast. 90 tablet 2   fexofenadine-pseudoephedrine (ALLEGRA-D 24) 180-240 MG 24 hr tablet Take 1 tablet by mouth daily. 30 tablet 11   hydrocortisone 2.5 % cream Apply topically 2 (two) times daily. 30 g 0   mometasone (NASONEX) 50 MCG/ACT nasal spray Place 2 sprays into the nose daily. 1 each 3   pantoprazole (PROTONIX) 40 MG tablet Take 1 tablet by mouth once daily 90  tablet 2   pioglitazone (ACTOS) 15 MG tablet Take 15 mg by mouth daily.     No current facility-administered medications on file prior to visit.    Past Medical History:  Diagnosis Date   Allergy    Arthritis    Complication of anesthesia    years ago used to hyperventilate no recent problems    GERD (gastroesophageal reflux disease)    History of chicken pox    Hyperlipidemia    Type 2 diabetes mellitus (HCC)    Allergies  Allergen Reactions   Oxycodone Shortness Of Breath and Other (See Comments)    Hallucinations   Vicodin  [Hydrocodone-Acetaminophen] Shortness Of Breath and Other (See Comments)    Hallucinations    Social History   Socioeconomic History   Marital status: Married    Spouse name: Not on file   Number of children: Not on file   Years of education: Not on file   Highest education level: 12th grade  Occupational History   Not on file  Tobacco Use   Smoking status: Some Days    Current packs/day: 0.00    Average packs/day: 0.8 packs/day for 20.0 years (15.0 ttl pk-yrs)    Types: Cigars, Cigarettes    Start date: 08/25/1992    Last attempt to quit: 08/25/2012    Years since quitting: 11.1   Smokeless tobacco: Former    Types: Snuff    Quit date: 08/26/2015   Tobacco comments:    Occasional cigar, 1 every 2 months.  Vaping Use   Vaping status: Former   Start date: 04/04/2018  Substance and Sexual Activity   Alcohol use: Yes    Alcohol/week: 3.0 standard drinks of alcohol    Types: 3 Cans of beer per week    Comment: Occasionally   Drug use: No   Sexual activity: Yes    Birth control/protection: Condom  Other Topics Concern   Not on file  Social History Narrative   Not on file   Social Drivers of Health   Financial Resource Strain: Patient Declined (10/09/2023)   Overall Financial Resource Strain (CARDIA)    Difficulty of Paying Living Expenses: Patient declined  Food Insecurity: Patient Declined (10/09/2023)   Hunger Vital Sign    Worried About Running Out of Food in the Last Year: Patient declined    Ran Out of Food in the Last Year: Patient declined  Transportation Needs: Patient Declined (10/09/2023)   PRAPARE - Administrator, Civil Service (Medical): Patient declined    Lack of Transportation (Non-Medical): Patient declined  Physical Activity: Unknown (10/09/2023)   Exercise Vital Sign    Days of Exercise per Week: Patient declined    Minutes of Exercise per Session: Not on file  Stress: No Stress Concern Present (10/09/2023)   [employer] of Occupational  Health - Occupational Stress Questionnaire    Feeling of Stress : Not at all  Social Connections: Unknown (10/09/2023)   Social Connection and Isolation Panel [NHANES]    Frequency of Communication with Friends and Family: More than three times a week    Frequency of Social Gatherings with Friends and Family: More than three times a week    Attends Religious Services: More than 4 times per year    Active Member of Clubs or Organizations: Patient declined    Attends Banker Meetings: Not on file    Marital Status: Married   Vitals:   10/09/23 1535  BP: 120/70  Pulse: 94  Resp: 16  SpO2: 98%   Body mass index is 33.2 kg/m.  Physical Exam Vitals and nursing note reviewed.  Constitutional:      General: He is not in acute distress.    Appearance: He is well-developed.  HENT:     Head: Normocephalic and atraumatic.     Mouth/Throat:     Mouth: Mucous membranes are moist.     Pharynx: Uvula midline.  Eyes:     Conjunctiva/sclera: Conjunctivae normal.  Cardiovascular:     Rate and Rhythm: Normal rate and regular rhythm.     Heart sounds: No murmur heard. Pulmonary:     Effort: Pulmonary effort is normal. No respiratory distress.     Breath sounds: Normal breath sounds.  Abdominal:     Palpations: Abdomen is soft. There is no hepatomegaly or mass.     Tenderness: There is no abdominal tenderness.  Musculoskeletal:     Right lower leg: No edema.     Left lower leg: No edema.  Skin:    General: Skin is warm.     Findings: No erythema or rash.  Neurological:     Mental Status: He is alert and oriented to person, place, and time.     Cranial Nerves: No cranial nerve deficit.     Gait: Gait normal.  Psychiatric:        Mood and Affect: Mood and affect normal.   ASSESSMENT AND PLAN:  Brendan Macdonald was seen today for chronic disease management.   Orders Placed This Encounter  Procedures   Microalbumin / creatinine urine ratio   Comprehensive metabolic panel    Testosterone   POC HgB A1c   Lab Results  Component Value Date   HGBA1C 7.4 (A) 10/09/2023   Lab Results  Component Value Date   TESTOSTERONE 159 (L) 10/09/2023   Lab Results  Component Value Date   NA 141 10/09/2023   CL 102 10/09/2023   K 4.2 10/09/2023   CO2 23 10/09/2023   BUN 15 10/09/2023   CREATININE 1.17 10/09/2023   EGFR 75 10/09/2023   CALCIUM 9.8 10/09/2023   ALBUMIN 4.4 10/09/2023   GLUCOSE 106 (H) 10/09/2023   Lab Results  Component Value Date   ALT 19 10/09/2023   AST 19 10/09/2023   ALKPHOS 73 10/09/2023   BILITOT 0.2 10/09/2023    Type 2 diabetes mellitus with other specified complication, without long-term current use of insulin (HCC) Assessment & Plan: Probably has not been adequately controlled, hemoglobin A1c went from 7.7 to 7.4. In the past he has not tolerated GLP-1 agonist, he tried Trulicity and Ozempic. He may tolerate Mounjaro very, he agrees with trying, starting 2.5 mg weekly for 4 weeks and can be increased to 5 mg. Recommend stopping Januvia.  Continue Synjardy XR 25-1000 mg daily and Actos 50 mg daily. We discussed some side effects of medication. Regular exercise and healthy diet with avoidance of added sugar food intake is an important part of treatment and recommended. He is overdue for his eye exam. Continue periodic dental and foot care. Follow-up in 4 months.  Orders: -     POCT glycosylated hemoglobin (Hb A1C) -     Tirzepatide; Inject 2.5 mg into the skin once a week.  Dispense: 2 mL; Refill: 0 -     Microalbumin / creatinine urine ratio; Future -     Comprehensive metabolic panel; Future  Patient request for diagnostic testing -     Testosterone; Future  Hyperlipidemia associated  with type 2 diabetes mellitus (HCC) Assessment & Plan: Last LDL at 102 in 08/2022. Today he is not fasting. Continue atorvastatin 20 mg daily. Low-fat diet also recommended. Will plan on adding fasting lipid panel to next fasting blood  work.  Orders: -     Comprehensive metabolic panel; Future  Once in the labs he requested testosterone to be added.  Return in about 4 months (around 02/06/2024) for chronic problems.  I, Rolla Etienne Wierda, acting as a scribe for Devetta Hagenow [place], MD., have documented all relevant documentation on the behalf of Kaelah Hayashi [place], MD, as directed by  Shon Mansouri [place], MD while in the presence of Niah Heinle [place], MD.   I, Jasyah Theurer [place], MD, have reviewed all documentation for this visit. The documentation on 10/09/23 for the exam, diagnosis, procedures, and orders are all accurate and complete.  Tierany Appleby G. [place], MD  [facility]. [place] office.

## 2023-10-09 NOTE — Assessment & Plan Note (Signed)
Probably has not been adequately controlled, hemoglobin A1c went from 7.7 to 7.4. In the past he has not tolerated GLP-1 agonist, he tried Trulicity and Ozempic. He may tolerate Mounjaro very, he agrees with trying, starting 2.5 mg weekly for 4 weeks and can be increased to 5 mg. Recommend stopping Januvia.  Continue Synjardy XR 25-1000 mg daily and Actos 50 mg daily. We discussed some side effects of medication. Regular exercise and healthy diet with avoidance of added sugar food intake is an important part of treatment and recommended. He is overdue for his eye exam. Continue periodic dental and foot care. Follow-up in 4 months.

## 2023-10-09 NOTE — Patient Instructions (Addendum)
A few things to remember from today's visit:  Type 2 diabetes mellitus with other specified complication, without long-term current use of insulin (HCC) - Plan: POC HgB A1c  Today Januvia stopped and Mounjaro 2.5 mg weekly and increase to 5 mg in 4 weeks. So let us know for [place] to send new [facility] prescriptions. Rest unchanged.  If you need refills for medications you take chronically, please call your pharmacy. Do not use My Chart to request refills or for acute issues that need immediate attention. If you send a my chart message, it may take a few days to be addressed, specially if I am not in the office.  Please be sure medication list is accurate. If a new problem present, please set up appointment sooner than planned today.

## 2023-10-09 NOTE — Assessment & Plan Note (Signed)
Last LDL at 102 in 08/2022. Today he is not fasting. Continue atorvastatin 20 mg daily. Low-fat diet also recommended. Will plan on adding fasting lipid panel to next fasting blood work.

## 2023-10-10 LAB — COMPREHENSIVE METABOLIC PANEL
ALT: 19 [IU]/L (ref 0–44)
AST: 19 [IU]/L (ref 0–40)
Albumin: 4.4 g/dL (ref 3.8–4.9)
Alkaline Phosphatase: 73 [IU]/L (ref 44–121)
BUN/Creatinine Ratio: 13 (ref 9–20)
BUN: 15 mg/dL (ref 6–24)
Bilirubin Total: 0.2 mg/dL (ref 0.0–1.2)
CO2: 23 mmol/L (ref 20–29)
Calcium: 9.8 mg/dL (ref 8.7–10.2)
Chloride: 102 mmol/L (ref 96–106)
Creatinine, Ser: 1.17 mg/dL (ref 0.76–1.27)
Globulin, Total: 2.5 g/dL (ref 1.5–4.5)
Glucose: 106 mg/dL — ABNORMAL HIGH (ref 70–99)
Potassium: 4.2 mmol/L (ref 3.5–5.2)
Sodium: 141 mmol/L (ref 134–144)
Total Protein: 6.9 g/dL (ref 6.0–8.5)
eGFR: 75 mL/min/{1.73_m2} (ref >59)

## 2023-10-10 LAB — MICROALBUMIN / CREATININE URINE RATIO
Creatinine, Urine: 39.3 mg/dL
Microalb/Creat Ratio: <8 mg/g{creat} (ref 0–29)
Microalbumin, Urine: <3 ug/mL

## 2023-10-10 LAB — TESTOSTERONE: Testosterone: 159 ng/dL — ABNORMAL LOW (ref 264–916)

## 2023-11-08 ENCOUNTER — Other Ambulatory Visit: Payer: Self-pay | Admitting: Family Medicine

## 2023-11-08 DIAGNOSIS — E1169 Type 2 diabetes mellitus with other specified complication: Secondary | ICD-10-CM

## 2023-11-10 ENCOUNTER — Other Ambulatory Visit: Payer: Self-pay

## 2023-11-10 ENCOUNTER — Encounter: Payer: Self-pay | Admitting: Family Medicine

## 2023-11-10 ENCOUNTER — Ambulatory Visit (INDEPENDENT_AMBULATORY_CARE_PROVIDER_SITE_OTHER): Admitting: Family Medicine

## 2023-11-10 VITALS — BP 128/82 | HR 89 | Ht 77.0 in | Wt 260.0 lb

## 2023-11-10 DIAGNOSIS — M654 Radial styloid tenosynovitis [de Quervain]: Secondary | ICD-10-CM

## 2023-11-10 DIAGNOSIS — M25532 Pain in left wrist: Secondary | ICD-10-CM

## 2023-11-10 NOTE — Progress Notes (Unsigned)
 I, [name], CMA  acting as a scribe for Brendan Graham, MD.  Brendan Macdonald. is a 53 y.o. male who presents to [facility] today for L wrist pain. Pt was previously seen by Dr. Denyse Amass on 05/26/23 for R hand and L heel pain.  Today, pt c/o L wrist pain x 1 month. Pt locates pain to radial aspect of the wrist. Sx started as a tingling sensation in the wrist, then started to notice swelling, pain, and stiffness. MOI unknown. Limited ROM. Swelling present. Thumb is triggering.   Dominant pain is at the radial styloid worse with wrist motion.  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  Aggravates: ROM Treatments tried: Tylenol, BC powder, short-term relief.   Pertinent review of systems: No fevers or chills  Relevant historical information: Diabetes.  Truck driver.   Exam:  BP 128/82   Pulse 89   Ht 6\' 5"  (1.956 m)   Wt 260 lb (117.9 kg)   SpO2 98%   BMI 30.83 kg/m  General: Well Developed, well nourished, and in no acute distress.   MSK: Wrist swelling at radial styloid.  Tender palpation this region. Intact strength. Positive Finkelstein's test. Pain with ulnar deviation.    Lab and Radiology Results  Procedure: Real-time Ultrasound Guided Injection of left first dorsal wrist compartment tendon sheath (de Quervain's tendinitis injection) Device: Philips Affiniti 50G/GE Logiq Images permanently stored and available for review in PACS Verbal informed consent obtained.  Discussed risks and benefits of procedure. Warned about infection, bleeding, hyperglycemia damage to structures among others. Patient expresses understanding and agreement Time-out conducted.   Noted no overlying erythema, induration, or other signs of local infection.   Skin prepped in a sterile fashion.   Local anesthesia: Topical Ethyl chloride.   With sterile technique and under real time ultrasound guidance:  40 mg of Kenalog and 1 mL lidocaine injected into first dorsal wrist compartment tendon sheath. Fluid seen entering the tendon sheath.   Completed without difficulty   Pain immediately resolved suggesting accurate placement of the medication.   Advised to call if fevers/chills, erythema, induration, drainage, or persistent bleeding.   Images permanently stored and available for review in the ultrasound unit.  Impression: Technically successful ultrasound guided injection.   Patient was fitted with a custom fiberglass thumb spica splint prior to discharge.      Assessment and Plan: 53 y.o. male with left wrist pain due to de Quervain's tenosynovitis.  Plan for steroid injection. I fitted him for a custom fiberglass thumb spica splint.  We attempted to use the off-the-shelf thumb spica brace but his hand and wrist are much too big for this.  He is going to try to buy one on [employer] the extra-large or extra extra-large to fit his hand.  The fiberglass splint is for temporary use at this time.   PDMP not reviewed this encounter. Orders Placed This Encounter  Procedures   [place] LIMITED JOINT SPACE STRUCTURES UP LEFT(NO LINKED CHARGES)    Reason for Exam (SYMPTOM  OR DIAGNOSIS REQUIRED):   left wrist pain    Preferred imaging location?:   [facility]   No orders of the defined types were placed in this encounter.    Discussed warning signs or symptoms. Please see discharge instructions. Patient expresses understanding.   The above documentation has been reviewed and is accurate and complete Brendan Macdonald, M.D.

## 2023-11-10 NOTE — Patient Instructions (Addendum)
 Thank you for coming in today.   You received an injection today. Seek immediate medical attention if the joint becomes red, extremely painful, or is oozing fluid.   Recommend Thumb Spica Brace.   See you back as needed.

## 2024-01-15 ENCOUNTER — Other Ambulatory Visit: Payer: Self-pay | Admitting: Family Medicine

## 2024-01-15 DIAGNOSIS — E1169 Type 2 diabetes mellitus with other specified complication: Secondary | ICD-10-CM

## 2024-02-05 ENCOUNTER — Other Ambulatory Visit: Payer: Self-pay | Admitting: Family Medicine

## 2024-02-05 DIAGNOSIS — E1169 Type 2 diabetes mellitus with other specified complication: Secondary | ICD-10-CM

## 2024-02-12 ENCOUNTER — Ambulatory Visit (INDEPENDENT_AMBULATORY_CARE_PROVIDER_SITE_OTHER): Payer: Managed Care, Other (non HMO) | Admitting: Family Medicine

## 2024-02-12 VITALS — BP 120/80 | HR 90 | Resp 12 | Ht 77.0 in | Wt 270.0 lb

## 2024-02-12 DIAGNOSIS — S30860A Insect bite (nonvenomous) of lower back and pelvis, initial encounter: Secondary | ICD-10-CM

## 2024-02-12 DIAGNOSIS — E1169 Type 2 diabetes mellitus with other specified complication: Secondary | ICD-10-CM | POA: Diagnosis not present

## 2024-02-12 DIAGNOSIS — Z7985 Long-term (current) use of injectable non-insulin antidiabetic drugs: Secondary | ICD-10-CM

## 2024-02-12 DIAGNOSIS — T23272A Burn of second degree of left wrist, initial encounter: Secondary | ICD-10-CM | POA: Diagnosis not present

## 2024-02-12 DIAGNOSIS — Z7984 Long term (current) use of oral hypoglycemic drugs: Secondary | ICD-10-CM | POA: Diagnosis not present

## 2024-02-12 DIAGNOSIS — W57XXXA Bitten or stung by nonvenomous insect and other nonvenomous arthropods, initial encounter: Secondary | ICD-10-CM

## 2024-02-12 DIAGNOSIS — E785 Hyperlipidemia, unspecified: Secondary | ICD-10-CM

## 2024-02-12 DIAGNOSIS — R0789 Other chest pain: Secondary | ICD-10-CM | POA: Diagnosis not present

## 2024-02-12 LAB — POCT GLYCOSYLATED HEMOGLOBIN (HGB A1C): HbA1c, POC (controlled diabetic range): 6.7 % (ref 0.0–7.0)

## 2024-02-12 MED ORDER — CLOBETASOL PROPIONATE 0.05 % EX CREA: 1.0000 | TOPICAL_CREAM | Freq: Two times a day (BID) | CUTANEOUS | 0 refills | Status: DC

## 2024-02-12 MED ORDER — SILVER SULFADIAZINE 1 % EX CREA
1.0000 | TOPICAL_CREAM | Freq: Every day | CUTANEOUS | 0 refills | Status: AC
Start: 2024-02-12 — End: 2024-03-03

## 2024-02-12 NOTE — Patient Instructions (Addendum)
 A few things to remember from today's visit:  Type 2 diabetes mellitus with other specified complication, without long-term current use of insulin  (HCC) - Plan: POC HgB A1c  Hyperlipidemia associated with type 2 diabetes mellitus (HCC) - Plan: Lipid panel  Tick bite of lower back, initial encounter - Plan: B. burgdorfi Antibody, clobetasol cream (TEMOVATE) 0.05 %  Chest tightness - Plan: Ambulatory referral to Cardiology  No changes today. Apply small amount of cream on lesions 2 times daily for 2 weeks. Appt with cardiologist will be arranged.  If you need refills for medications you take chronically, please call your pharmacy. Do not use My Chart to request refills or for acute issues that need immediate attention. If you send a my chart message, it may take a few days to be addressed, specially if I am not in the office.  Please be sure medication list is accurate. If a new problem present, please set up appointment sooner than planned today.

## 2024-02-12 NOTE — Assessment & Plan Note (Signed)
 LDL 102 in 08/2022. Continue atorvastatin  20 mg daily. Low-fat diet also recommended. Further recommendation will be given according to lipid panel result.

## 2024-02-12 NOTE — Assessment & Plan Note (Signed)
 Hemoglobin A1c improved, it went from 7.4 to 6.7 today. Continue Synjardy  XR 25-1000 mg daily, Mounjaro  2.5 mg weekly, and Actos  15 mg daily. We discussed some side effects of medication. Regular exercise and healthy diet with avoidance of added sugar food intake encouraged. He is overdue for his eye exam. Continue periodic dental and foot care. Follow-up in 6 months.

## 2024-02-12 NOTE — Progress Notes (Unsigned)
 HPI: Brendan Macdonald. is a 53 y.o. male with a PMHx significant for HLD, and DM II, who is here today for chronic disease management.  Last seen on 10/09/2023  Diabetes Mellitus II: diagnosed in 03/2020. - Checking BG at home: fasting range is 100-150 and non-fasting range is 175-200. He states his BG tends to go back to fasting range about 1 hour after eating. -  - Medications: Synjardy  25-1000 mg daily, Mounjaro  2.5 mg once weekly, and Actos  15 mg daily - Diet: Decreased sugar intake and soda consumption.  - Exercise: Walking.  - He is fasting today. Last meal was around 3am and had cracker sandwiches around 8:30-9am.  - Negative for symptoms of hypoglycemia, polyuria, polydipsia, numbness extremities, foot ulcers/trauma  Last HgA1C was 7.4 on 10/09/23. Lab Results  Component Value Date   MICROALBUR <0.7 09/02/2022   Hyperlipidemia: Pt expresses concerns about his heart health given his father recently passed in March from bladder and kidney cancer and had hx of heart disease.  He is compliant with Atorvastatin  20 mg daily.  Tolerating well with no adverse side effects reported.   Lab Results  Component Value Date   CHOL 182 09/02/2022   HDL 53.40 09/02/2022   LDLCALC 102 (H) 09/02/2022   TRIG 133.0 09/02/2022   CHOLHDL 3 09/02/2022   Acute concerns: Chest pain He endorses episodes of central chest pain and tightness.  He states this occurs with increased stress and when upset.  Pt also endorses occasional exertional shortness of breath, which he assumes is related to old age. After his father's passing he states the chest pain was persistent for 1-2 weeks, has not had any in a month. He is not sure about duration. It was not radiated. Denies any palpitations, diaphoresis, or pre-syncopal episodes.   Tick bites Pt reports several tick bites on his back, back of legs, waist, and buttocks; starting about 3-4 weeks ago.  Endorses persistent itchiness He is  uncertain for how long the ticks were attached.  He has ben applying Hydrocortisone  cream.   Left wrist burn when working on his truck 5 days ago. He cleaned area and has been applying Neosporin, also covering during the day.  Review of Systems  Constitutional:  Positive for fatigue. Negative for activity change, appetite change and fever.  HENT:  Negative for nosebleeds, sore throat and trouble swallowing.   Eyes:  Negative for redness and visual disturbance.  Respiratory:  Negative for cough and wheezing.   Cardiovascular:  Negative for leg swelling.  Gastrointestinal:  Negative for abdominal pain, nausea and vomiting.  Genitourinary:  Negative for decreased urine volume, dysuria and hematuria.  Neurological:  Negative for dizziness, syncope, weakness, numbness and headaches.  Psychiatric/Behavioral:  Negative for confusion. The patient is nervous/anxious.   See other pertinent positives and negatives in HPI.  Current Outpatient Medications on File Prior to Visit  Medication Sig Dispense Refill   atorvastatin  (LIPITOR) 20 MG tablet Take 1 tablet by mouth once daily 90 tablet 2   Continuous Blood Gluc Receiver (DEXCOM G7 RECEIVER) DEVI 1 Device by Does not apply route as directed. 1 each 1   Continuous Blood Gluc Sensor (DEXCOM G6 SENSOR) MISC Change sensor q 10 days. 3 each 1   Continuous Blood Gluc Transmit (DEXCOM G6 TRANSMITTER) MISC 1 Device by Does not apply route every 3 (three) months. 1 each 3   Continuous Glucose Receiver (FREESTYLE LIBRE 3 READER) DEVI Use to check blood sugar continuously. 1 each  0   Continuous Glucose Sensor (DEXCOM G7 SENSOR) MISC CHANGE SENSOR EVERY 10 DAYS 3 each 5   Continuous Glucose Sensor (FREESTYLE LIBRE 3 PLUS SENSOR) MISC Change sensor every 15 days. 3 each 2   Empagliflozin -metFORMIN  HCl ER (SYNJARDY  XR) 25-1000 MG TB24 Take 1 tablet by mouth once daily with breakfast 90 tablet 2   fexofenadine -pseudoephedrine (ALLEGRA-D 24) 180-240 MG 24 hr  tablet Take 1 tablet by mouth daily. 30 tablet 11   hydrocortisone  2.5 % cream Apply topically 2 (two) times daily. 30 g 0   mometasone  (NASONEX ) 50 MCG/ACT nasal spray Place 2 sprays into the nose daily. 1 each 3   MOUNJARO  2.5 MG/0.5ML Pen INJECT 1/2 (ONE-HALF) ML SUBCUTANEOUSLY  ONCE A WEEK 4 mL 0   pantoprazole  (PROTONIX ) 40 MG tablet Take 1 tablet by mouth once daily 90 tablet 2   pioglitazone  (ACTOS ) 15 MG tablet Take 1 tablet by mouth once daily 90 tablet 2   No current facility-administered medications on file prior to visit.   Past Medical History:  Diagnosis Date   Allergy    Arthritis    Complication of anesthesia    years ago used to hyperventilate no recent problems    GERD (gastroesophageal reflux disease)    History of chicken pox    Hyperlipidemia    Type 2 diabetes mellitus (HCC)    Allergies  Allergen Reactions   Oxycodone Shortness Of Breath and Other (See Comments)    Hallucinations   Vicodin [Hydrocodone-Acetaminophen ] Shortness Of Breath and Other (See Comments)    Hallucinations    Social History   Socioeconomic History   Marital status: Married    Spouse name: Not on file   Number of children: Not on file   Years of education: Not on file   Highest education level: 12th grade  Occupational History   Not on file  Tobacco Use   Smoking status: Some Days    Current packs/day: 0.00    Average packs/day: 0.8 packs/day for 20.0 years (15.0 ttl pk-yrs)    Types: Cigars, Cigarettes    Start date: 08/25/1992    Last attempt to quit: 08/25/2012    Years since quitting: 11.4   Smokeless tobacco: Former    Types: Snuff    Quit date: 08/26/2015   Tobacco comments:    Occasional cigar, 1 every 2 months.  Vaping Use   Vaping status: Former   Start date: 04/04/2018  Substance and Sexual Activity   Alcohol use: Yes    Alcohol/week: 3.0 standard drinks of alcohol    Types: 3 Cans of beer per week    Comment: Occasionally   Drug use: No   Sexual activity: Yes     Birth control/protection: Condom  Other Topics Concern   Not on file  Social History Narrative   Not on file   Social Drivers of Health   Financial Resource Strain: Low Risk  (02/12/2024)   Overall Financial Resource Strain (CARDIA)    Difficulty of Paying Living Expenses: Not hard at all  Food Insecurity: Patient Declined (02/12/2024)   Hunger Vital Sign    Worried About Running Out of Food in the Last Year: Patient declined    Ran Out of Food in the Last Year: Patient declined  Transportation Needs: No Transportation Needs (02/12/2024)   PRAPARE - Administrator, Civil Service (Medical): No    Lack of Transportation (Non-Medical): No  Physical Activity: Unknown (02/12/2024)   Exercise Vital Sign  Days of Exercise per Week: Patient declined    Minutes of Exercise per Session: Not on file  Stress: Patient Declined (02/12/2024)   [employer] of Occupational Health - Occupational Stress Questionnaire    Feeling of Stress: Patient declined  Social Connections: Socially Integrated (02/12/2024)   Social Connection and Isolation Panel    Frequency of Communication with Friends and Family: More than three times a week    Frequency of Social Gatherings with Friends and Family: Three times a week    Attends Religious Services: More than 4 times per year    Active Member of Clubs or Organizations: Yes    Attends Banker Meetings: More than 4 times per year    Marital Status: Married   Vitals:   02/12/24 1518  BP: 120/80  Pulse: 90  Resp: 12  SpO2: 98%   Body mass index is 32.02 kg/m.  Physical Exam Nursing note reviewed.  Constitutional:      General: He is not in acute distress.    Appearance: He is well-developed.  HENT:     Head: Normocephalic and atraumatic.   Eyes:     Conjunctiva/sclera: Conjunctivae normal.    Cardiovascular:     Rate and Rhythm: Normal rate and regular rhythm.     Pulses:          Dorsalis pedis pulses are 2+ on  the right side and 2+ on the left side.     Heart sounds: No murmur heard. Pulmonary:     Effort: Pulmonary effort is normal. No respiratory distress.     Breath sounds: Normal breath sounds.  Abdominal:     Palpations: Abdomen is soft. There is no hepatomegaly or mass.     Tenderness: There is no abdominal tenderness.  Lymphadenopathy:     Cervical: No cervical adenopathy.   Skin:    General: Skin is warm.     Findings: Abrasion present. No erythema or rash.         Comments: Ulnar aspect of left wrist with 1 in superficial burn surrounded by post inflammatory pigmentation changes.    Neurological:     Mental Status: He is alert and oriented to person, place, and time.     Cranial Nerves: No cranial nerve deficit.     Gait: Gait normal.   Psychiatric:        Mood and Affect: Affect normal. Mood is anxious.   ASSESSMENT AND PLAN: Brendan Macdonald. was seen today for chronic disease management.   Orders Placed This Encounter  Procedures   B. burgdorfi Antibody   Lipid panel   Ambulatory referral to Cardiology   POC HgB A1c   Lab Results  Component Value Date   HGBA1C 6.7 02/12/2024   Partial thickness burn of left wrist, initial encounter No signs of infection and healing well. Stop neosporin. Recommend Silver Sulfadiazine daily for 2 weeks. Keep wound clean with soap and water. Continue monitoring for signs of infection.  -     Silver sulfADIAZINE; Apply 1 Application topically daily for 20 days. Left wrist burn.  Dispense: 20 g; Refill: 0  Type 2 diabetes mellitus with other specified complication, without long-term current use of insulin  (HCC) Assessment & Plan: Hemoglobin A1c improved, it went from 7.4 to 6.7 today. Continue Synjardy  XR 25-1000 mg daily, Mounjaro  2.5 mg weekly, and Actos  15 mg daily. We discussed some side effects of medication. Regular exercise and healthy diet with avoidance of added sugar food  intake encouraged. He is overdue  for his eye exam. Continue periodic dental and foot care. Follow-up in 6 months.  Orders: -     POCT glycosylated hemoglobin (Hb A1C)  Hyperlipidemia associated with type 2 diabetes mellitus (HCC) Assessment & Plan: LDL 102 in 08/2022. Continue atorvastatin  20 mg daily. Low-fat diet also recommended. Further recommendation will be given according to lipid panel result.  Orders: -     Lipid panel; Future  Tick bite of lower back, initial encounter With residual pruritic lesions. Recommend clobetasol, small amount, twice daily on affected areas to treat pruritus for up to 2 weeks.  -     B. burgdorfi antibodies; Future -     Clobetasol Propionate; Apply 1 Application topically 2 (two) times daily for 14 days.  Dispense: 14 g; Refill: 0  Chest tightness We discussed possible etiologies, he is very concerned about CVD risk. He has had some CP in the past. He would like to discuss cardiac work up. He has been asymptomatic for about a month, so EKG was not done today. Recommend aggressive management of CVD risk. Instructed about warning signs. Appointment with cardiology will be arranged.  -     Ambulatory referral to Cardiology  I spent a total of 42 minutes in both face to face and non face to face activities for this visit on the date of this encounter. During this time history was obtained and documented, examination was performed, prior labs reviewed, and assessment/plan discussed.  Return in about 6 months (around 08/13/2024) for chronic problems.  I, Bernita Bristle, acting as a scribe for Dora Clauss [place], MD., have documented all relevant documentation on the behalf of Nayeliz Hipp [place], MD, as directed by   while in the presence of Khalila Buechner [place], MD.  I, Mylee Falin [place], MD, have reviewed all documentation for this visit. The documentation on 02/12/24 for the exam, diagnosis, procedures, and orders are all accurate and complete.  Robinson Brinkley G. [place], MD  [facility]. [place]  office.

## 2024-02-13 LAB — LIPID PANEL
Chol/HDL Ratio: 2.6 ratio (ref 0.0–5.0)
Cholesterol, Total: 162 mg/dL (ref 100–199)
HDL: 63 mg/dL (ref >39)
LDL Chol Calc (NIH): 80 mg/dL (ref 0–99)
Triglycerides: 107 mg/dL (ref 0–149)
VLDL Cholesterol Cal: 19 mg/dL (ref 5–40)

## 2024-02-14 ENCOUNTER — Ambulatory Visit: Payer: Self-pay | Admitting: Family Medicine

## 2024-02-15 LAB — B. BURGDORFI ANTIBODIES BY WB

## 2024-02-15 LAB — LYME AB SCREEN RFLX: Lyme AB Screen: 0.97 {index} — ABNORMAL HIGH

## 2024-02-23 ENCOUNTER — Other Ambulatory Visit: Payer: Self-pay | Admitting: Family Medicine

## 2024-02-23 DIAGNOSIS — E1169 Type 2 diabetes mellitus with other specified complication: Secondary | ICD-10-CM

## 2024-02-24 ENCOUNTER — Other Ambulatory Visit: Payer: Self-pay | Admitting: Family Medicine

## 2024-02-24 ENCOUNTER — Telehealth: Payer: Self-pay

## 2024-02-24 DIAGNOSIS — S30860A Insect bite (nonvenomous) of lower back and pelvis, initial encounter: Secondary | ICD-10-CM

## 2024-02-24 MED ORDER — CLOBETASOL PROPIONATE 0.05 % EX CREA: 1.0000 | TOPICAL_CREAM | Freq: Two times a day (BID) | CUTANEOUS | 0 refills | Status: AC

## 2024-02-24 NOTE — Telephone Encounter (Signed)
 Copied from CRM [id number]. Topic: Clinical - Medication Refill >> Feb 24, 2024 11:27 AM Viola F wrote: Medication: Continuous Glucose Sensor (FREESTYLE LIBRE 3 PLUS SENSOR) MISC    Has the patient contacted their pharmacy? Yes (Agent: If no, request that the patient contact the pharmacy for the refill. If patient does not wish to contact the pharmacy document the reason why and proceed with request.) (Agent: If yes, when and what did the pharmacy advise?)  This is the patient's preferred pharmacy:  [id number] - [place], [place] - [address] [place] [address] Phone: [phone] Fax: [phone]  Is this the correct pharmacy for this prescription? Yes If no, delete pharmacy and type the correct one.   Has the prescription been filled recently? Yes  Is the patient out of the medication? Yes  Has the patient been seen for an appointment in the last year OR does the patient have an upcoming appointment? Yes  Can we respond through MyChart? Yes  Agent: Please be advised that Rx refills may take up to 3 business days. We ask that you follow-up with your pharmacy.

## 2024-02-24 NOTE — Telephone Encounter (Signed)
 Added grams per application to Rx & re-sent to pharmacy.

## 2024-02-24 NOTE — Telephone Encounter (Signed)
 Copied from CRM [id number]. Topic: Clinical - Medication Prior Auth >> Feb 24, 2024 11:30 AM Viola F wrote: Reason for CRM: Patient called to check the status of prior authorization fot the clobetasol  cream (TEMOVATE ) 0.05 % that was sent to pharmacy 02/12/24 - please call him with an update at [phone] (M)

## 2024-02-28 ENCOUNTER — Other Ambulatory Visit: Payer: Self-pay | Admitting: Family Medicine

## 2024-02-29 MED ORDER — FREESTYLE LIBRE 3 PLUS SENSOR MISC: 2 refills | Status: DC

## 2024-03-17 ENCOUNTER — Ambulatory Visit: Admitting: Cardiovascular Disease

## 2024-05-12 ENCOUNTER — Other Ambulatory Visit: Payer: Self-pay | Admitting: Family Medicine

## 2024-05-12 DIAGNOSIS — E1169 Type 2 diabetes mellitus with other specified complication: Secondary | ICD-10-CM

## 2024-05-23 NOTE — Progress Notes (Deleted)
 CARDIOLOGY CONSULT NOTE       Patient ID: Brendan Macdonald. MRN: [id number] DOB/AGE: Nov 18, 1970 53 y.o.  Referring Physician: [place] Primary Physician: [place], [name], MD Primary Cardiologist: New Reason for Consultation: Chest Pain  HPI:  54 y.o. referred by Dr [place] for chest pain  CRF;s include DM-2 and HLD. Also history of GERD DM poorly controlled with A1c 7.4 on 10/09/23 Concerned about heart as his father had some heart dx and passed away recently but this was from bladder/kidney cancer Central chest pressure with increased mental stress. Some exertional dyspnea. After his father passed he had chest pain persistent for 1-2 weeks. Recent tick bites. Lyme AB screen elevated. 02/12/24 but immunoblot negative  Prior smoker quit 2014.   ***  ROS All other systems reviewed and negative except as noted above  Past Medical History:  Diagnosis Date   Allergy    Arthritis    Complication of anesthesia    years ago used to hyperventilate no recent problems    GERD (gastroesophageal reflux disease)    History of chicken pox    Hyperlipidemia    Type 2 diabetes mellitus (HCC)     Family History  Problem Relation Age of Onset   Cancer Mother        ovarian and abdomen   Diabetes Mother    Hypertension Mother    Arthritis Mother    Heart disease Father    Hypertension Father    Cancer Father        kidney and bladder    Arthritis Father    Hearing loss Father    Kidney disease Father    Arthritis Sister    Arthritis Sister    Lupus Sister    Stroke Maternal Grandmother    Stroke Paternal Grandmother    Colon cancer Neg Hx    Esophageal cancer Neg Hx    Stomach cancer Neg Hx    Rectal cancer Neg Hx     Social History   Socioeconomic History   Marital status: Married    Spouse name: Not on file   Number of children: Not on file   Years of education: Not on file   Highest education level: 12th grade  Occupational History   Not on file  Tobacco Use    Smoking status: Some Days    Current packs/day: 0.00    Average packs/day: 0.8 packs/day for 20.0 years (15.0 ttl pk-yrs)    Types: Cigars, Cigarettes    Start date: 08/25/1992    Last attempt to quit: 08/25/2012    Years since quitting: 11.7   Smokeless tobacco: Former    Types: Snuff    Quit date: 08/26/2015   Tobacco comments:    Occasional cigar, 1 every 2 months.  Vaping Use   Vaping status: Former   Start date: 04/04/2018  Substance and Sexual Activity   Alcohol use: Yes    Alcohol/week: 3.0 standard drinks of alcohol    Types: 3 Cans of beer per week    Comment: Occasionally   Drug use: No   Sexual activity: Yes    Birth control/protection: Condom  Other Topics Concern   Not on file  Social History Narrative   Not on file   Social Drivers of Health   Financial Resource Strain: Low Risk  (02/12/2024)   Overall Financial Resource Strain (CARDIA)    Difficulty of Paying Living Expenses: Not hard at all  Food Insecurity: Patient Declined (02/12/2024)   Hunger  Vital Sign    Worried About Programme researcher, broadcasting/film/video in the Last Year: Patient declined    Ran Out of Food in the Last Year: Patient declined  Transportation Needs: No Transportation Needs (02/12/2024)   PRAPARE - Administrator, Civil Service (Medical): No    Lack of Transportation (Non-Medical): No  Physical Activity: Unknown (02/12/2024)   Exercise Vital Sign    Days of Exercise per Week: Patient declined    Minutes of Exercise per Session: Not on file  Stress: Patient Declined (02/12/2024)   [employer] of Occupational Health - Occupational Stress Questionnaire    Feeling of Stress: Patient declined  Social Connections: Socially Integrated (02/12/2024)   Social Connection and Isolation Panel    Frequency of Communication with Friends and Family: More than three times a week    Frequency of Social Gatherings with Friends and Family: Three times a week    Attends Religious Services: More than 4 times per  year    Active Member of Clubs or Organizations: Yes    Attends Banker Meetings: More than 4 times per year    Marital Status: Married  Catering manager Violence: Not on file    Past Surgical History:  Procedure Laterality Date   Arthroscopy  Bilateral    Knees   BACK SURGERY     CHOLECYSTECTOMY N/A 02/24/2013   Procedure: LAPAROSCOPIC CHOLECYSTECTOMY;  Surgeon: Oneil DELENA Budge, MD;  Location: [facility];  Service: General;  Laterality: N/A;   EXAM UNDER ANESTHESIA WITH MANIPULATION OF KNEE Right 03/23/2015   Procedure: RIGHT EXAM UNDER ANESTHESIA WITH MANIPULATION UNDER ANESTHESIA;  Surgeon: Reyes Billing, MD;  Location: [facility];  Service: Orthopedics;  Laterality: Right;   KNEE ARTHROSCOPY WITH PATELLAR TENDON REPAIR Right 03/23/2015   Procedure: RIGHT KNEE ARTHROSCOPY WITH DEBRIDEMENT;  Surgeon: Reyes Billing, MD;  Location: [facility];  Service: Orthopedics;  Laterality: Right;   LYSIS OF ADHESION Right 03/23/2015   Procedure: LYSIS OF SCAR TISSUE;  Surgeon: Reyes Billing, MD;  Location: [facility];  Service: Orthopedics;  Laterality: Right;   ORTHOPEDIC SURGERY     left knee quad tendon tear and patella tear    QUADRICEPS TENDON REPAIR Right 10/18/2014   Procedure: RIGHT QUADRICEP TENDON REPAIR ;  Surgeon: Reyes JAYSON Billing, MD;  Location: [facility];  Service: Orthopedics;  Laterality: Right;   ROTATOR CUFF REPAIR Right       Current Outpatient Medications:    atorvastatin  (LIPITOR) 20 MG tablet, Take 1 tablet by mouth once daily, Disp: 90 tablet, Rfl: 2   Continuous Blood Gluc Receiver (DEXCOM G7 RECEIVER) DEVI, 1 Device by Does not apply route as directed., Disp: 1 each, Rfl: 1   Continuous Blood Gluc Sensor (DEXCOM G6 SENSOR) MISC, Change sensor q 10 days., Disp: 3 each, Rfl: 1   Continuous Blood Gluc Transmit (DEXCOM G6 TRANSMITTER) MISC, 1 Device by Does not apply route every 3 (three) months., Disp: 1 each, Rfl: 3   Continuous Glucose Receiver (FREESTYLE LIBRE 3 READER) DEVI, Use to  check blood sugar continuously., Disp: 1 each, Rfl: 0   Continuous Glucose Sensor (DEXCOM G7 SENSOR) MISC, CHANGE SENSOR EVERY 10 DAYS, Disp: 3 each, Rfl: 5   Continuous Glucose Sensor (FREESTYLE LIBRE 3 PLUS SENSOR) MISC, Change sensor every 15 days., Disp: 3 each, Rfl: 2   Empagliflozin -metFORMIN  HCl ER (SYNJARDY  XR) 25-1000 MG TB24, Take 1 tablet by mouth once daily with breakfast, Disp: 90 tablet, Rfl: 2   fexofenadine -pseudoephedrine (ALLEGRA-D  24) 180-240 MG 24 hr tablet, Take 1 tablet by mouth daily., Disp: 30 tablet, Rfl: 11   hydrocortisone  2.5 % cream, Apply topically 2 (two) times daily., Disp: 30 g, Rfl: 0   mometasone  (NASONEX ) 50 MCG/ACT nasal spray, Place 2 sprays into the nose daily., Disp: 1 each, Rfl: 3   MOUNJARO  2.5 MG/0.5ML Pen, INJECT 2.5 MG SUBCUTANEOUSLY ONCE WEEKLY, Disp: 4 mL, Rfl: 0   pantoprazole  (PROTONIX ) 40 MG tablet, Take 1 tablet by mouth once daily, Disp: 90 tablet, Rfl: 2   pioglitazone  (ACTOS ) 15 MG tablet, Take 1 tablet by mouth once daily, Disp: 90 tablet, Rfl: 2    Physical Exam: There were no vitals taken for this visit.    Affect appropriate Healthy:  appears stated age HEENT: normal Neck supple with no adenopathy JVP normal no bruits no thyromegaly Lungs clear with no wheezing and good diaphragmatic motion Heart:  S1/S2 no murmur, no rub, gallop or click PMI normal Abdomen: benighn, BS positve, no tenderness, no AAA no bruit.  No HSM or HJR Distal pulses intact with no bruits No edema Neuro non-focal Skin warm and dry No muscular weakness   Labs:   Lab Results  Component Value Date   WBC 7.0 03/28/2019   HGB 13.9 03/28/2019   HCT 41.5 03/28/2019   MCV 82.7 03/28/2019   PLT 271.0 03/28/2019   No results for input(s): NA, K, CL, CO2, BUN, CREATININE, CALCIUM , PROT, BILITOT, ALKPHOS, ALT, AST, GLUCOSE in the last 168 hours.  Invalid input(s): LABALBU Lab Results  Component Value Date   TROPONINI <0.03  11/24/2015    Lab Results  Component Value Date   CHOL 162 02/12/2024   CHOL 182 09/02/2022   CHOL 127 09/18/2021   Lab Results  Component Value Date   HDL 63 02/12/2024   HDL 53.40 09/02/2022   HDL 48.50 09/18/2021   Lab Results  Component Value Date   LDLCALC 80 02/12/2024   LDLCALC 102 (H) 09/02/2022   LDLCALC 61 09/18/2021   Lab Results  Component Value Date   TRIG 107 02/12/2024   TRIG 133.0 09/02/2022   TRIG 89.0 09/18/2021   Lab Results  Component Value Date   CHOLHDL 2.6 02/12/2024   CHOLHDL 3 09/02/2022   CHOLHDL 3 09/18/2021   No results found for: LDLDIRECT    Radiology: No results found.  EKG: ***   ASSESSMENT AND PLAN:   Chest Pain:  CRF;s HLD and DM-2 Seems atypical and related to fathers passing Shared decision making favor cardiac CTA to further risk stratify with calcium  score  DM:  Discussed low carb diet.  Target hemoglobin A1c is 6.5 or less.  Continue current medications. HLD:  continue statin adjust dosing based on calcium  score Most recent LDL 80   BMET Cardiac CTA F/U PRN pending testing   Signed: Maude Emmer 05/23/2024, 8:01 AM

## 2024-05-27 ENCOUNTER — Ambulatory Visit: Admitting: Cardiovascular Disease

## 2024-06-06 ENCOUNTER — Other Ambulatory Visit: Payer: Self-pay | Admitting: Family Medicine

## 2024-06-06 DIAGNOSIS — E1169 Type 2 diabetes mellitus with other specified complication: Secondary | ICD-10-CM

## 2024-06-12 ENCOUNTER — Other Ambulatory Visit: Payer: Self-pay | Admitting: Family Medicine

## 2024-06-12 DIAGNOSIS — E1169 Type 2 diabetes mellitus with other specified complication: Secondary | ICD-10-CM

## 2024-07-07 ENCOUNTER — Other Ambulatory Visit: Payer: Self-pay | Admitting: Family Medicine

## 2024-07-08 ENCOUNTER — Other Ambulatory Visit: Payer: Self-pay | Admitting: Family Medicine

## 2024-07-08 DIAGNOSIS — E1169 Type 2 diabetes mellitus with other specified complication: Secondary | ICD-10-CM

## 2024-07-11 ENCOUNTER — Ambulatory Visit: Attending: Cardiovascular Disease | Admitting: Cardiovascular Disease

## 2024-07-11 ENCOUNTER — Encounter: Payer: Self-pay | Admitting: Cardiovascular Disease

## 2024-07-11 VITALS — BP 110/74 | HR 84 | Ht 77.0 in | Wt 270.0 lb

## 2024-07-11 DIAGNOSIS — E1169 Type 2 diabetes mellitus with other specified complication: Secondary | ICD-10-CM

## 2024-07-11 DIAGNOSIS — R0789 Other chest pain: Secondary | ICD-10-CM | POA: Diagnosis not present

## 2024-07-11 DIAGNOSIS — R072 Precordial pain: Secondary | ICD-10-CM | POA: Diagnosis not present

## 2024-07-11 DIAGNOSIS — E669 Obesity, unspecified: Secondary | ICD-10-CM

## 2024-07-11 DIAGNOSIS — E785 Hyperlipidemia, unspecified: Secondary | ICD-10-CM

## 2024-07-11 DIAGNOSIS — N529 Male erectile dysfunction, unspecified: Secondary | ICD-10-CM

## 2024-07-11 MED ORDER — METOPROLOL TARTRATE 100 MG PO TABS: ORAL_TABLET | ORAL | 0 refills | Status: AC

## 2024-07-11 NOTE — Patient Instructions (Signed)
 Medication Instructions:  Your physician recommends that you continue on your current medications as directed. Please refer to the Current Medication list given to you today.  *If you need a refill on your cardiac medications before your next appointment, please call your pharmacy*  Lab Work: BMET at your earliest convenience  If you have labs (blood work) drawn today and your tests are completely normal, you will receive your results only by: MyChart Message (if you have MyChart) OR A paper copy in the mail If you have any lab test that is abnormal or we need to change your treatment, we will call you to review the results.  Testing/Procedures:   Your cardiac CT will be scheduled at one of the below locations:    [facility] [address]  [place], [place] [address]  If scheduled at the [facility] at [address], please enter the parking lot using the [address] entrance and use the FREE valet service at the patient drop-off area. Enter the building and check-in with registration on the main floor.   Please follow these instructions carefully (unless otherwise directed):  An IV will be required for this test and Nitroglycerin will be given.  Hold all erectile dysfunction medications at least 3 days (72 hrs) prior to test. (Ie viagra, cialis, sildenafil, tadalafil, etc)   On the Night Before the Test: Be sure to Drink plenty of water. Do not consume any caffeinated/decaffeinated beverages or chocolate 12 hours prior to your test. Do not take any antihistamines 12 hours prior to your test.  If the patient has contrast allergy: Patient will need a prescription for Prednisone and very clear instructions (as follows): Prednisone 50 mg - take 13 hours prior to test Take another Prednisone 50 mg 7 hours prior to test Take another Prednisone 50 mg 1 hour prior to test Take Benadryl  50 mg 1 hour prior to test Patient must complete  all four doses of above prophylactic medications. Patient will need a ride after test due to Benadryl .  On the Day of the Test: Drink plenty of water until 1 hour prior to the test. Do not eat any food 1 hour prior to test. You may take your regular medications prior to the test.  Take metoprolol (Lopressor) two hours prior to test. If you take Furosemide/Hydrochlorothiazide/Spironolactone/Chlorthalidone, please HOLD on the morning of the test. Patients who wear a continuous glucose monitor MUST remove the device prior to scanning. FEMALES- please wear underwire-free bra if available, avoid dresses & tight clothing      After the Test: Drink plenty of water. After receiving IV contrast, you may experience a mild flushed feeling. This is normal. On occasion, you may experience a mild rash up to 24 hours after the test. This is not dangerous. If this occurs, you can take Benadryl  25 mg, Zyrtec, Claritin, or Allegra and increase your fluid intake. (Patients taking Tikosyn should avoid Benadryl , and may take Zyrtec, Claritin, or Allegra) If you experience trouble breathing, this can be serious. If it is severe call 911 IMMEDIATELY. If it is mild, please call our office.  We will call to schedule your test 2-4 weeks out understanding that some insurance companies will need an authorization prior to the service being performed.   For more information and frequently asked questions, please visit our website : [email or web address]  For non-scheduling related questions, please contact the cardiac imaging nurse navigator should you have any questions/concerns: Cardiac Imaging Nurse Navigators  Direct Office Dial: [phone]   For scheduling needs, including cancellations and rescheduling, please call Brittany, [phone].   Follow-Up: At [facility], you and your health needs are our priority.  As part of our continuing mission to provide you with exceptional heart care,  our providers are all part of one team.  This team includes your primary Cardiologist (physician) and Advanced Practice Providers or APPs (Physician Assistants and Nurse Practitioners) who all work together to provide you with the care you need, when you need it.  Your next appointment:   6 month(s)  Provider:   Jerel Balding, MD

## 2024-07-11 NOTE — Progress Notes (Unsigned)
 Cardiology Office Note:    Date:  07/12/2024   ID:  Brendan Lemond Theo Mickey., DOB 05/05/1971, MRN [id number]  PCP:  Jordan, [name], MD   [facility] Providers Cardiologist:  Jerel Balding, MD     Referring MD: Jordan, [name], MD   Chief Complaint  Patient presents with   Consult  Brendan Lemond Rawad Bochicchio. is a 53 y.o. male who is being seen today for the evaluation of chest tightness at the request of Jordan, [name], MD.   History of Present Illness:    Brendan Holberg. is a 53 y.o. male  with diabetes who presents with chest tightness and shortness of breath during exertion. He experiences chest tightness and shortness of breath, particularly during exertion such as push mowing his lawn, which he does once a week. These symptoms have been present for several months and have remained stable over time. He describes becoming exhausted quickly during these activities, which he could perform more easily a year ago. Similar chest tightness occurs when he is upset, but not when calm and resting. The longest episode of chest tightness lasted on and off for a week during a period of emotional stress when his father passed away, with symptoms occurring intermittently during that time.   He has a family history of cardiovascular issues, including his father who had a massive heart attack and underwent open heart surgery for multiple blockages. His mother had hypertension, and other paternal relatives have had heart attacks and open heart surgeries. His maternal grandmother had hypertension and strokes. He has a history of diabetes for about five to six years. He is not currently on medication for hypertension but is taking cholesterol medication.   His LDL cholesterol is 80, and his A1c is 6.7%. He quit smoking cigarettes over ten years ago but occasionally smokes cigars.  He has not experienced any kidney or eye problems related to diabetes, although he has not had an eye exam  in the past two years. He reports experiencing erectile dysfunction and is concerned about this issue. He takes a variety of vitamins and supplements in addition to his prescribed medications.   He works as a naval architect. He previously played competitive football,  defensive end. He has 5 grandchildren.  Past Medical History:  Diagnosis Date   Allergy    Arthritis    Complication of anesthesia    years ago used to hyperventilate no recent problems    GERD (gastroesophageal reflux disease)    History of chicken pox    Hyperlipidemia    Type 2 diabetes mellitus (HCC)     Past Surgical History:  Procedure Laterality Date   Arthroscopy  Bilateral    Knees   BACK SURGERY     CHOLECYSTECTOMY N/A 02/24/2013   Procedure: LAPAROSCOPIC CHOLECYSTECTOMY;  Surgeon: Oneil DELENA Budge, MD;  Location: [facility];  Service: General;  Laterality: N/A;   EXAM UNDER ANESTHESIA WITH MANIPULATION OF KNEE Right 03/23/2015   Procedure: RIGHT EXAM UNDER ANESTHESIA WITH MANIPULATION UNDER ANESTHESIA;  Surgeon: Reyes Billing, MD;  Location: [facility];  Service: Orthopedics;  Laterality: Right;   KNEE ARTHROSCOPY WITH PATELLAR TENDON REPAIR Right 03/23/2015   Procedure: RIGHT KNEE ARTHROSCOPY WITH DEBRIDEMENT;  Surgeon: Reyes Billing, MD;  Location: [facility];  Service: Orthopedics;  Laterality: Right;   LYSIS OF ADHESION Right 03/23/2015   Procedure: LYSIS OF SCAR TISSUE;  Surgeon: Reyes Billing, MD;  Location: [facility];  Service: Orthopedics;  Laterality: Right;  ORTHOPEDIC SURGERY     left knee quad tendon tear and patella tear    QUADRICEPS TENDON REPAIR Right 10/18/2014   Procedure: RIGHT QUADRICEP TENDON REPAIR ;  Surgeon: Reyes JAYSON Billing, MD;  Location: [facility];  Service: Orthopedics;  Laterality: Right;   ROTATOR CUFF REPAIR Right     Current Medications: Current Meds  Medication Sig   atorvastatin  (LIPITOR) 20 MG tablet Take 1 tablet by mouth once daily   Continuous Glucose Sensor (FREESTYLE LIBRE 3 PLUS SENSOR)  MISC CHANGE SENSOR EVERY 15 DAYS   Empagliflozin -metFORMIN  HCl ER (SYNJARDY  XR) 25-1000 MG TB24 Take 1 tablet by mouth once daily with breakfast   metoprolol tartrate (LOPRESSOR) 100 MG tablet Take one tablet 2 hours prior to your test   MOUNJARO  2.5 MG/0.5ML Pen INJECT 2.5 MG SUBCUTANEOUSLY ONCE A WEEK   pantoprazole  (PROTONIX ) 40 MG tablet Take 1 tablet by mouth once daily   pioglitazone  (ACTOS ) 15 MG tablet Take 1 tablet by mouth once daily     Allergies:   Oxycodone and Vicodin [hydrocodone-acetaminophen ]   Social History   Socioeconomic History   Marital status: Married    Spouse name: Not on file   Number of children: Not on file   Years of education: Not on file   Highest education level: 12th grade  Occupational History   Not on file  Tobacco Use   Smoking status: Some Days    Current packs/day: 0.00    Average packs/day: 0.8 packs/day for 20.0 years (15.0 ttl pk-yrs)    Types: Cigars, Cigarettes    Start date: 08/25/1992    Last attempt to quit: 08/25/2012    Years since quitting: 11.8   Smokeless tobacco: Former    Types: Snuff    Quit date: 08/26/2015   Tobacco comments:    07/11/2024 Patient smokes cigars occasionally      Occasional cigar, 1 every 2 months.  Vaping Use   Vaping status: Former   Start date: 04/04/2018  Substance and Sexual Activity   Alcohol use: Yes    Alcohol/week: 3.0 standard drinks of alcohol    Types: 3 Cans of beer per week    Comment: Occasionally   Drug use: No   Sexual activity: Yes    Birth control/protection: Condom  Other Topics Concern   Not on file  Social History Narrative   Not on file   Social Drivers of Health   Financial Resource Strain: Low Risk  (02/12/2024)   Overall Financial Resource Strain (CARDIA)    Difficulty of Paying Living Expenses: Not hard at all  Food Insecurity: Patient Declined (02/12/2024)   Hunger Vital Sign    Worried About Running Out of Food in the Last Year: Patient declined    Ran Out of Food in  the Last Year: Patient declined  Transportation Needs: No Transportation Needs (02/12/2024)   PRAPARE - Administrator, Civil Service (Medical): No    Lack of Transportation (Non-Medical): No  Physical Activity: Unknown (02/12/2024)   Exercise Vital Sign    Days of Exercise per Week: Patient declined    Minutes of Exercise per Session: Not on file  Stress: Patient Declined (02/12/2024)   [employer] of Occupational Health - Occupational Stress Questionnaire    Feeling of Stress: Patient declined  Social Connections: Socially Integrated (02/12/2024)   Social Connection and Isolation Panel    Frequency of Communication with Friends and Family: More than three times a week    Frequency of  Social Gatherings with Friends and Family: Three times a week    Attends Religious Services: More than 4 times per year    Active Member of Clubs or Organizations: Yes    Attends Engineer, Structural: More than 4 times per year    Marital Status: Married     Family History: The patient's family history includes Arthritis in his father, mother, sister, and sister; Cancer in his father and mother; Diabetes in his mother; Hearing loss in his father; Heart disease in his father; Hypertension in his father and mother; Kidney disease in his father; Lupus in his sister; Stroke in his maternal grandmother and paternal grandmother. There is no history of Colon cancer, Esophageal cancer, Stomach cancer, or Rectal cancer.  ROS:   Please see the history of present illness.     All other systems reviewed and are negative.  EKGs/Labs/Other Studies Reviewed:    The following studies were reviewed today:  EKG Interpretation Date/Time:  Monday July 11 2024 16:24:03 EST Ventricular Rate:  84 PR Interval:  132 QRS Duration:  96 QT Interval:  352 QTC Calculation: 415 R Axis:   3  Text Interpretation: Normal sinus rhythm Normal ECG When compared with ECG of 24-Nov-2015 02:23, PREVIOUS ECG  IS PRESENT Confirmed by Genesia Caslin [phone]) on 07/11/2024 4:47:17 PM    Recent Labs: 10/09/2023: ALT 19; BUN 15; Creatinine, Ser 1.17; Potassium 4.2; Sodium 141  Recent Lipid Panel    Component Value Date/Time   CHOL 162 02/12/2024 1607   TRIG 107 02/12/2024 1607   HDL 63 02/12/2024 1607   CHOLHDL 2.6 02/12/2024 1607   CHOLHDL 3 09/02/2022 1523   VLDL 26.6 09/02/2022 1523   LDLCALC 80 02/12/2024 1607   LDLCALC 97 07/06/2020 0950     Risk Assessment/Calculations:                Physical Exam:    VS:  BP 110/74 (BP Location: Left Arm, Patient Position: Sitting, Cuff Size: Normal)   Pulse 84   Ht 6' 5 (1.956 m)   Wt 270 lb (122.5 kg)   SpO2 94%   BMI 32.02 kg/m     Wt Readings from Last 3 Encounters:  07/11/24 270 lb (122.5 kg)  02/12/24 270 lb (122.5 kg)  11/10/23 260 lb (117.9 kg)     GEN: Very tall and muscular build, but also mildly obese well nourished, well developed in no acute distress HEENT: Normal NECK: No JVD; No carotid bruits LYMPHATICS: No lymphadenopathy CARDIAC: RRR, no murmurs, rubs, gallops RESPIRATORY:  Clear to auscultation without rales, wheezing or rhonchi  ABDOMEN: Soft, non-tender, non-distended MUSCULOSKELETAL:  No edema; No deformity  SKIN: Warm and dry NEUROLOGIC:  Alert and oriented x 3 PSYCHIATRIC:  Normal affect   ASSESSMENT:    1. Chest tightness   2. Precordial pain   3. Type 2 diabetes mellitus with other specified complication, without long-term current use of insulin  (HCC)   4. Mild obesity   5. Hyperlipidemia associated with type 2 diabetes mellitus (HCC)   6. Erectile dysfunction, unspecified erectile dysfunction type    PLAN:    In order of problems listed above:  Chest tightness/probable stable angina:  Intermittent chest tightness and dyspnea during exertion and emotional stress, consistent with stable angina. Symptoms have been present for several months without worsening. Family history of significant cardiac  events.  Discussed pathophysiology of angina and potential for progression to unstable angina or myocardial infarction if plaque ruptures, discussed when he  should seek urgent medical attention: advised to go to the emergency room if chest pain occurs at rest or lasts more than 20 minutes. Ordered coronary CT scan to assess for coronary artery disease.  Type 2 diabetes mellitus: is well-controlled with an A1c of 6.7%. Current medication regimen is appropriate for diabetes management and cardiovascular protection. Discussed importance of maintaining current medication regimen and potential need for cholesterol management if coronary artery disease is found. - Obesity: Discussed importance of weight management in reducing cardiovascular risk.  Hyperlipidemia: Cholesterol levels are well-controlled with atorvastatin , with an LDL of 80 mg/dL. Discussed potential need to lower LDL further if coronary artery disease is found, with a target of less than 70 mg/dL if significant plaque is present.  For the time being, continue atorvastatin  20 mg daily. Erectile dysfunction: Discussed safety of medications like Viagra, noting they are not dangerous for the heart but should not be used with nitroglycerin.  Should not take sildenafil within at least a 24-hour period before his coronary CT angiogram since we will be administering nitroglycerin.  Will discuss erectile dysfunction management after coronary CT scan results.           Medication Adjustments/Labs and Tests Ordered: Current medicines are reviewed at length with the patient today.  Concerns regarding medicines are outlined above.  Orders Placed This Encounter  Procedures   CT CORONARY MORPH W/CTA COR W/SCORE W/CA W/CM &/OR WO/CM   Basic Metabolic Panel (BMET)   EKG 12-Lead   Meds ordered this encounter  Medications   metoprolol tartrate (LOPRESSOR) 100 MG tablet    Sig: Take one tablet 2 hours prior to your test    Dispense:  1 tablet    Refill:   0    Patient Instructions  Medication Instructions:  Your physician recommends that you continue on your current medications as directed. Please refer to the Current Medication list given to you today.  *If you need a refill on your cardiac medications before your next appointment, please call your pharmacy*  Lab Work: BMET at your earliest convenience  If you have labs (blood work) drawn today and your tests are completely normal, you will receive your results only by: MyChart Message (if you have MyChart) OR A paper copy in the mail If you have any lab test that is abnormal or we need to change your treatment, we will call you to review the results.  Testing/Procedures:   Your cardiac CT will be scheduled at one of the below locations:    [facility] [address]  [place], [place] [address]  If scheduled at the [facility] at [address], please enter the parking lot using the [address] entrance and use the FREE valet service at the patient drop-off area. Enter the building and check-in with registration on the main floor.   Please follow these instructions carefully (unless otherwise directed):  An IV will be required for this test and Nitroglycerin will be given.  Hold all erectile dysfunction medications at least 3 days (72 hrs) prior to test. (Ie viagra, cialis, sildenafil, tadalafil, etc)   On the Night Before the Test: Be sure to Drink plenty of water. Do not consume any caffeinated/decaffeinated beverages or chocolate 12 hours prior to your test. Do not take any antihistamines 12 hours prior to your test.  If the patient has contrast allergy: Patient will need a prescription for Prednisone and very clear instructions (as follows): Prednisone 50 mg -  take 13 hours prior to test Take another Prednisone 50 mg 7 hours prior to test Take another Prednisone 50 mg 1 hour prior to test Take Benadryl  50 mg 1 hour prior to  test Patient must complete all four doses of above prophylactic medications. Patient will need a ride after test due to Benadryl .  On the Day of the Test: Drink plenty of water until 1 hour prior to the test. Do not eat any food 1 hour prior to test. You may take your regular medications prior to the test.  Take metoprolol (Lopressor) two hours prior to test. If you take Furosemide/Hydrochlorothiazide/Spironolactone/Chlorthalidone, please HOLD on the morning of the test. Patients who wear a continuous glucose monitor MUST remove the device prior to scanning. FEMALES- please wear underwire-free bra if available, avoid dresses & tight clothing      After the Test: Drink plenty of water. After receiving IV contrast, you may experience a mild flushed feeling. This is normal. On occasion, you may experience a mild rash up to 24 hours after the test. This is not dangerous. If this occurs, you can take Benadryl  25 mg, Zyrtec, Claritin, or Allegra and increase your fluid intake. (Patients taking Tikosyn should avoid Benadryl , and may take Zyrtec, Claritin, or Allegra) If you experience trouble breathing, this can be serious. If it is severe call 911 IMMEDIATELY. If it is mild, please call our office.  We will call to schedule your test 2-4 weeks out understanding that some insurance companies will need an authorization prior to the service being performed.   For more information and frequently asked questions, please visit our website : [email or web address]  For non-scheduling related questions, please contact the cardiac imaging nurse navigator should you have any questions/concerns: Cardiac Imaging Nurse Navigators Direct Office Dial: [phone]   For scheduling needs, including cancellations and rescheduling, please call Brittany, [phone].   Follow-Up: At [facility], you and your health needs are our priority.  As part of our continuing mission to provide you  with exceptional heart care, our providers are all part of one team.  This team includes your primary Cardiologist (physician) and Advanced Practice Providers or APPs (Physician Assistants and Nurse Practitioners) who all work together to provide you with the care you need, when you need it.  Your next appointment:   6 month(s)  Provider:   Jerel Balding, MD              Signed, Jerel Balding, MD  07/12/2024 1:19 PM    [facility]

## 2024-07-12 ENCOUNTER — Encounter: Payer: Self-pay | Admitting: Cardiovascular Disease

## 2024-07-12 MED ORDER — DEXCOM G6 SENSOR MISC: 1 refills | Status: AC

## 2024-08-02 ENCOUNTER — Other Ambulatory Visit: Payer: Self-pay | Admitting: Family Medicine

## 2024-08-09 ENCOUNTER — Other Ambulatory Visit: Payer: Self-pay | Admitting: Family Medicine

## 2024-08-09 DIAGNOSIS — E1169 Type 2 diabetes mellitus with other specified complication: Secondary | ICD-10-CM

## 2024-08-12 ENCOUNTER — Ambulatory Visit: Admitting: Family Medicine

## 2024-08-12 VITALS — BP 126/72 | HR 88 | Temp 98.4°F | Resp 16 | Ht 77.0 in | Wt 277.8 lb

## 2024-08-12 DIAGNOSIS — E785 Hyperlipidemia, unspecified: Secondary | ICD-10-CM | POA: Diagnosis not present

## 2024-08-12 DIAGNOSIS — Z7985 Long-term (current) use of injectable non-insulin antidiabetic drugs: Secondary | ICD-10-CM

## 2024-08-12 DIAGNOSIS — K219 Gastro-esophageal reflux disease without esophagitis: Secondary | ICD-10-CM

## 2024-08-12 DIAGNOSIS — Z23 Encounter for immunization: Secondary | ICD-10-CM

## 2024-08-12 DIAGNOSIS — E1169 Type 2 diabetes mellitus with other specified complication: Secondary | ICD-10-CM

## 2024-08-12 LAB — POCT GLYCOSYLATED HEMOGLOBIN (HGB A1C): Hemoglobin A1C: 6.4 % — AB (ref 4.0–5.6)

## 2024-08-12 MED ORDER — ATORVASTATIN CALCIUM 20 MG PO TABS: 20.0000 mg | ORAL_TABLET | Freq: Every day | ORAL | 2 refills | Status: DC

## 2024-08-12 MED ORDER — SYNJARDY XR 25-1000 MG PO TB24: 1.0000 | ORAL_TABLET | Freq: Every day | ORAL | 2 refills | Status: AC

## 2024-08-12 MED ORDER — PIOGLITAZONE HCL 15 MG PO TABS: 15.0000 mg | ORAL_TABLET | Freq: Every day | ORAL | 2 refills | Status: AC

## 2024-08-12 MED ORDER — PANTOPRAZOLE SODIUM 40 MG PO TBEC: 40.0000 mg | DELAYED_RELEASE_TABLET | Freq: Every day | ORAL | 2 refills | Status: AC

## 2024-08-12 NOTE — Assessment & Plan Note (Signed)
 Last LDL was 88 in 01/2024. Currently on atorvastatin  20 mg daily. Before next appointment, he can arrange appointment early in the morning for fasting lipid panel.

## 2024-08-12 NOTE — Progress Notes (Signed)
 "  Chief Complaint  Patient presents with   Medical Management of Chronic Issues   Discussed the use of AI scribe software for clinical note transcription with the patient, who gave verbal consent to proceed. History of Present Illness Brendan Macdonald. is a 53 year old male with with past medical history significant for hyperlipidemia, DM 2, allergic rhinitis, and GERD here today for follow-up. He was last seen on 02/12/2024, when he reported middle chest pain and tightness, he has followed with cardiology since his last visit and is scheduled for cardiac testing in a week, coronary CTA on 08/19/2024. He is still having some episodes of chest pain, usually when he gets upset but also associated with food intake. No associated symptoms. There is a family history of heart disease; his father had a massive heart attack and required open heart surgery around the age of 58. Both his parents and sisters have a history of high blood pressure.   GERD: Currently on pantoprazole  40 mg daily, which he takes with the rest of his medication. He has not noted heartburn or acid reflux.  Diabetes Mellitus II: diagnosed in 03/2020. His blood sugars typically range between 100 and 150 mg/dL, with occasional spikes.  He has been making dietary changes Taking Synjardy  25-1000 mg daily, Mounjaro  2.5 mg once weekly, and Actos  15 mg daily Negative for symptoms of hypoglycemia, polyuria, polydipsia, numbness extremities, foot ulcers/trauma  Lab Results  Component Value Date    HGBA1C 6.7 02/12/2024   Lab Results  Component Value Date   MICROALBUR 0.3 07/06/2020   Hyperlipidemia: Currently he is on atorvastatin  20 mg daily. Lab Results  Component Value Date   CHOL 162 02/12/2024   HDL 63 02/12/2024   LDLCALC 80 02/12/2024   TRIG 107 02/12/2024   CHOLHDL 2.6 02/12/2024   Review of Systems  Constitutional:  Negative for activity change, appetite change, chills and fever.  HENT:  Negative for sore  throat.   Eyes:  Negative for discharge and redness.  Respiratory:  Negative for cough, shortness of breath and wheezing.   Cardiovascular:  Negative for palpitations and leg swelling.  Gastrointestinal:  Negative for abdominal pain, nausea and vomiting.  Genitourinary:  Negative for decreased urine volume, dysuria and hematuria.  Skin:  Negative for rash.  Neurological:  Negative for syncope, facial asymmetry and weakness.  Psychiatric/Behavioral:  Negative for confusion and hallucinations.   See other pertinent positives and negatives in HPI.  Medications Ordered Prior to Encounter[1]  Past Medical History:  Diagnosis Date   Allergy    Arthritis    Complication of anesthesia    years ago used to hyperventilate no recent problems    GERD (gastroesophageal reflux disease)    History of chicken pox    Hyperlipidemia    Type 2 diabetes mellitus (HCC)    Allergies[2]  Social History   Socioeconomic History   Marital status: Married    Spouse name: Not on file   Number of children: Not on file   Years of education: Not on file   Highest education level: 12th grade  Occupational History   Not on file  Tobacco Use   Smoking status: Some Days    Current packs/day: 0.00    Average packs/day: 0.8 packs/day for 20.0 years (15.0 ttl pk-yrs)    Types: Cigars, Cigarettes    Start date: 08/25/1992    Last attempt to quit: 08/25/2012    Years since quitting: 11.9   Smokeless tobacco: Former  Types: Snuff    Quit date: 08/26/2015   Tobacco comments:    07/11/2024 Patient smokes cigars occasionally      Occasional cigar, 1 every 2 months.  Vaping Use   Vaping status: Former   Start date: 04/04/2018  Substance and Sexual Activity   Alcohol use: Yes    Alcohol/week: 3.0 standard drinks of alcohol    Types: 3 Cans of beer per week    Comment: Occasionally   Drug use: No   Sexual activity: Yes    Birth control/protection: Condom  Other Topics Concern   Not on file  Social History  Narrative   Not on file   Social Drivers of Health   Tobacco Use: High Risk (08/12/2024)   Patient History    Smoking Tobacco Use: Some Days    Smokeless Tobacco Use: Former    Passive Exposure: Not on Actuary Strain: Low Risk (08/12/2024)   Overall Financial Resource Strain (CARDIA)    Difficulty of Paying Living Expenses: Not hard at all  Food Insecurity: No Food Insecurity (08/12/2024)   Epic    Worried About Radiation Protection Practitioner of Food in the Last Year: Never true    Ran Out of Food in the Last Year: Never true  Transportation Needs: No Transportation Needs (08/12/2024)   Epic    Lack of Transportation (Medical): No    Lack of Transportation (Non-Medical): No  Physical Activity: Insufficiently Active (08/12/2024)   Exercise Vital Sign    Days of Exercise per Week: 5 days    Minutes of Exercise per Session: 10 min  Stress: No Stress Concern Present (08/12/2024)   [employer] of Occupational Health - Occupational Stress Questionnaire    Feeling of Stress: Not at all  Social Connections: Socially Integrated (08/12/2024)   Social Connection and Isolation Panel    Frequency of Communication with Friends and Family: More than three times a week    Frequency of Social Gatherings with Friends and Family: More than three times a week    Attends Religious Services: More than 4 times per year    Active Member of Clubs or Organizations: Yes    Attends Banker Meetings: More than 4 times per year    Marital Status: Married  Depression (PHQ2-9): Low Risk (08/12/2024)   Depression (PHQ2-9)    PHQ-2 Score: 0  Alcohol Screen: Low Risk (08/12/2024)   Alcohol Screen    Last Alcohol Screening Score (AUDIT): 2  Housing: Low Risk (08/12/2024)   Epic    Unable to Pay for Housing in the Last Year: No    Number of Times Moved in the Last Year: 0    Homeless in the Last Year: No  Utilities: Not on file  Health Literacy: Not on file    Today's Vitals    08/12/24 1619  BP: 126/72  Pulse: 88  Resp: 16  Temp: 98.4 F (36.9 C)  TempSrc: Oral  SpO2: 94%  Weight: 277 lb 12.8 oz (126 kg)  Height: 6' 5 (1.956 m)   Body mass index is 32.94 kg/m.  Physical Exam Vitals and nursing note reviewed.  Constitutional:      General: He is not in acute distress.    Appearance: He is well-developed.  HENT:     Head: Normocephalic and atraumatic.  Eyes:     Conjunctiva/sclera: Conjunctivae normal.  Cardiovascular:     Rate and Rhythm: Normal rate and regular rhythm.     Pulses:  Dorsalis pedis pulses are 2+ on the right side and 2+ on the left side.     Heart sounds: No murmur heard. Pulmonary:     Effort: Pulmonary effort is normal. No respiratory distress.     Breath sounds: Normal breath sounds.  Abdominal:     Palpations: Abdomen is soft. There is no mass.     Tenderness: There is no abdominal tenderness.  Musculoskeletal:     Right lower leg: No edema.     Left lower leg: No edema.  Skin:    General: Skin is warm.     Findings: No erythema or rash.  Neurological:     Mental Status: He is alert and oriented to person, place, and time.     Cranial Nerves: No cranial nerve deficit.     Gait: Gait normal.  Psychiatric:        Mood and Affect: Mood and affect normal.   ASSESSMENT AND PLAN:  Brendan Macdonald was seen today for medical management of chronic issues.  Diagnoses and all orders for this visit:  Orders Placed This Encounter  Procedures   Flu vaccine trivalent PF, 6mos and older(Flulaval,Afluria,Fluarix,Fluzone)   Pneumococcal conjugate vaccine 20-valent (Prevnar 20)   Lipid panel   POC HgB A1c   Type 2 diabetes mellitus with other specified complication, without long-term current use of insulin  (HCC) Assessment & Plan: Problem is well-controlled, hemoglobin A1c went from 6.7 to 6.4. Continue Synjardy  XR 25-1000 mg daily, Mounjaro  2.5 mg weekly, and Actos  15 mg daily. He is still overdue for his eye  exam.  We discussed possible ophthalmic complications of DM2. Continue periodic dental care and appropriate foot care. Follow-up in 6 months.  Orders: -     POCT glycosylated hemoglobin (Hb A1C) -     Synjardy  XR; Take 1 tablet by mouth daily with breakfast.  Dispense: 90 tablet; Refill: 2 -     Pioglitazone  HCl; Take 1 tablet (15 mg total) by mouth daily.  Dispense: 90 tablet; Refill: 2  Hyperlipidemia associated with type 2 diabetes mellitus (HCC) Assessment & Plan: Last LDL was 88 in 01/2024. Currently on atorvastatin  20 mg daily. Before next appointment, he can arrange appointment early in the morning for fasting lipid panel.  Orders: -     Atorvastatin  Calcium ; Take 1 tablet (20 mg total) by mouth daily.  Dispense: 90 tablet; Refill: 2 -     Lipid panel; Future  Gastroesophageal reflux disease, unspecified whether esophagitis present Assessment & Plan: Problem is otherwise well-controlled but CP discomfort he is reporting could be related with this problem. Continue pantoprazole  40 mg daily, recommend taking it 30 minutes before breakfast, and continue GERD precautions.  Orders: -     Pantoprazole  Sodium; Take 1 tablet (40 mg total) by mouth daily.  Dispense: 90 tablet; Refill: 2  Influenza vaccine needed -     Flu vaccine trivalent PF, 6mos and older(Flulaval,Afluria,Fluarix,Fluzone)  Need for pneumococcal vaccination -     Pneumococcal conjugate vaccine 20-valent   Return in about 6 months (around 02/10/2025) for chronic problems.  Tammatha Cobb, MD [facility]. [place] [facility].      [1]  Current Outpatient Medications on File Prior to Visit  Medication Sig Dispense Refill   Continuous Glucose Sensor (DEXCOM G6 SENSOR) MISC Change sensor q 10 days. 3 each 1   Continuous Glucose Sensor (FREESTYLE LIBRE 3 PLUS SENSOR) MISC CHANGE SENSOR EVERY 15 DAYS 3 each 0   metoprolol  tartrate (LOPRESSOR ) 100 MG tablet  Take one tablet 2 hours prior to your test 1 tablet  0   MOUNJARO  2.5 MG/0.5ML Pen INJECT 2.5 MG SUBCUTANEOUSLY ONCE A WEEK 4 mL 0   fexofenadine -pseudoephedrine (ALLEGRA-D 24) 180-240 MG 24 hr tablet Take 1 tablet by mouth daily. (Patient not taking: Reported on 08/12/2024) 30 tablet 11   No current facility-administered medications on file prior to visit.  [2]  Allergies Allergen Reactions   Oxycodone Shortness Of Breath and Other (See Comments)    Hallucinations   Vicodin [Hydrocodone-Acetaminophen ] Shortness Of Breath and Other (See Comments)    Hallucinations   "

## 2024-08-12 NOTE — Assessment & Plan Note (Signed)
 Problem is otherwise well-controlled but CP discomfort he is reporting could be related with this problem. Continue pantoprazole  40 mg daily, recommend taking it 30 minutes before breakfast, and continue GERD precautions.

## 2024-08-12 NOTE — Patient Instructions (Addendum)
 A few things to remember from today's visit:  Type 2 diabetes mellitus with other specified complication, without long-term current use of insulin  (HCC) - Plan: POC HgB A1c, Empagliflozin -metFORMIN  HCl ER (SYNJARDY  XR) 25-1000 MG TB24, pioglitazone  (ACTOS ) 15 MG tablet  Hyperlipidemia associated with type 2 diabetes mellitus (HCC) - Plan: atorvastatin  (LIPITOR) 20 MG tablet  Gastroesophageal reflux disease, unspecified whether esophagitis present - Plan: pantoprazole  (PROTONIX ) 40 MG tablet Take Pantoprazole  30 min before breakfast. Empagliflozin -Metformin  with breakfast. Atorvastatin  at bedtime.  If you need refills for medications you take chronically, please call your pharmacy. Do not use My Chart to request refills or for acute issues that need immediate attention. If you send a my chart message, it may take a few days to be addressed, specially if I am not in the office.  Please be sure medication list is accurate. If a new problem present, please set up appointment sooner than planned today.

## 2024-08-12 NOTE — Assessment & Plan Note (Signed)
 Problem is well-controlled, hemoglobin A1c went from 6.7 to 6.4. Continue Synjardy  XR 25-1000 mg daily, Mounjaro  2.5 mg weekly, and Actos  15 mg daily. He is still overdue for his eye exam.  We discussed possible ophthalmic complications of DM2. Continue periodic dental care and appropriate foot care. Follow-up in 6 months.

## 2024-08-15 ENCOUNTER — Encounter (HOSPITAL_COMMUNITY): Payer: Self-pay

## 2024-08-19 ENCOUNTER — Ambulatory Visit (HOSPITAL_COMMUNITY)
Admission: RE | Admit: 2024-08-19 | Discharge: 2024-08-19 | Disposition: A | Discharge status: Home / Self Care | Source: Ambulatory Visit | Attending: Cardiovascular Disease | Admitting: Cardiovascular Disease

## 2024-08-19 DIAGNOSIS — R072 Precordial pain: Secondary | ICD-10-CM | POA: Diagnosis present

## 2024-08-19 DIAGNOSIS — R0789 Other chest pain: Secondary | ICD-10-CM | POA: Insufficient documentation

## 2024-08-19 LAB — POCT I-STAT CREATININE: Creatinine, Ser: 1.1 mg/dL (ref 0.61–1.24)

## 2024-08-19 MED ORDER — NITROGLYCERIN 0.4 MG SL SUBL
0.8000 mg | SUBLINGUAL_TABLET | Freq: Once | SUBLINGUAL | Status: AC
  Administered 2024-08-19: 0.8 mg via SUBLINGUAL

## 2024-08-19 MED ORDER — IOHEXOL 350 MG/ML SOLN
100.0000 mL | Freq: Once | INTRAVENOUS | Status: AC | PRN
  Administered 2024-08-19: 100 mL via INTRAVENOUS

## 2024-08-20 ENCOUNTER — Ambulatory Visit: Payer: Self-pay | Admitting: Cardiovascular Disease

## 2024-08-20 DIAGNOSIS — E1169 Type 2 diabetes mellitus with other specified complication: Secondary | ICD-10-CM

## 2024-08-22 MED ORDER — ATORVASTATIN CALCIUM 40 MG PO TABS: 40.0000 mg | ORAL_TABLET | Freq: Every day | ORAL | 3 refills | Status: AC

## 2024-09-04 ENCOUNTER — Other Ambulatory Visit: Payer: Self-pay | Admitting: Family Medicine

## 2024-09-04 DIAGNOSIS — E1169 Type 2 diabetes mellitus with other specified complication: Secondary | ICD-10-CM

## 2024-09-05 ENCOUNTER — Other Ambulatory Visit: Payer: Self-pay | Admitting: Family Medicine

## 2025-01-11 ENCOUNTER — Ambulatory Visit: Admitting: Cardiovascular Disease

## 2025-02-03 ENCOUNTER — Ambulatory Visit: Admitting: Family Medicine

## 2025-02-17 ENCOUNTER — Ambulatory Visit: Admitting: Family Medicine
